# Patient Record
Sex: Male | Born: 1935
Health system: Southern US, Community
[De-identification: ages and names within clinical notes are randomized; demographics above are authoritative.]

## PROBLEM LIST (undated history)

## (undated) DIAGNOSIS — K5790 Diverticulosis of intestine, part unspecified, without perforation or abscess without bleeding: Secondary | ICD-10-CM

## (undated) DIAGNOSIS — L259 Unspecified contact dermatitis, unspecified cause: Secondary | ICD-10-CM

## (undated) DIAGNOSIS — R972 Elevated prostate specific antigen [PSA]: Secondary | ICD-10-CM

## (undated) DIAGNOSIS — N400 Enlarged prostate without lower urinary tract symptoms: Secondary | ICD-10-CM

## (undated) DIAGNOSIS — E872 Acidosis, unspecified: Secondary | ICD-10-CM

## (undated) DIAGNOSIS — I4891 Unspecified atrial fibrillation: Secondary | ICD-10-CM

## (undated) DIAGNOSIS — D649 Anemia, unspecified: Secondary | ICD-10-CM

## (undated) DIAGNOSIS — N200 Calculus of kidney: Secondary | ICD-10-CM

## (undated) DIAGNOSIS — I700 Atherosclerosis of aorta: Secondary | ICD-10-CM

## (undated) DIAGNOSIS — G629 Polyneuropathy, unspecified: Secondary | ICD-10-CM

## (undated) DIAGNOSIS — I482 Chronic atrial fibrillation, unspecified: Secondary | ICD-10-CM

## (undated) DIAGNOSIS — I10 Essential (primary) hypertension: Secondary | ICD-10-CM

## (undated) DIAGNOSIS — C61 Malignant neoplasm of prostate: Secondary | ICD-10-CM

## (undated) DIAGNOSIS — E785 Hyperlipidemia, unspecified: Secondary | ICD-10-CM

## (undated) DIAGNOSIS — E1140 Type 2 diabetes mellitus with diabetic neuropathy, unspecified: Secondary | ICD-10-CM

## (undated) DIAGNOSIS — I2510 Atherosclerotic heart disease of native coronary artery without angina pectoris: Secondary | ICD-10-CM

## (undated) DIAGNOSIS — E782 Mixed hyperlipidemia: Secondary | ICD-10-CM

## (undated) DIAGNOSIS — B351 Tinea unguium: Secondary | ICD-10-CM

## (undated) HISTORY — PX: EXPLORATORY LAPAROTOMY: SUR591

## (undated) HISTORY — PX: PROSTATE BIOPSY: SHX241

## (undated) HISTORY — DX: Benign prostatic hyperplasia without lower urinary tract symptoms: N40.0

## (undated) HISTORY — DX: Essential (primary) hypertension: I10

## (undated) HISTORY — DX: Chronic atrial fibrillation, unspecified: I48.20

## (undated) HISTORY — DX: Calculus of kidney: N20.0

## (undated) HISTORY — DX: Benign prostatic hyperplasia without lower urinary tract symptoms: R97.20

## (undated) HISTORY — DX: Polyneuropathy, unspecified: G62.9

## (undated) HISTORY — DX: Acidosis: E87.2

## (undated) HISTORY — DX: Acidosis, unspecified: E87.20

## (undated) HISTORY — DX: Unspecified atrial fibrillation: I48.91

## (undated) HISTORY — DX: Elevated prostate specific antigen (PSA): R97.20

## (undated) HISTORY — DX: Atherosclerotic heart disease of native coronary artery without angina pectoris: I25.10

## (undated) HISTORY — DX: Tinea unguium: B35.1

## (undated) HISTORY — DX: Unspecified contact dermatitis, unspecified cause: L25.9

## (undated) HISTORY — DX: Atherosclerosis of aorta: I70.0

## (undated) HISTORY — DX: Hyperlipidemia, unspecified: E78.5

## (undated) HISTORY — DX: Diverticulosis of intestine, part unspecified, without perforation or abscess without bleeding: K57.90

## (undated) HISTORY — DX: Anemia, unspecified: D64.9

## (undated) HISTORY — DX: Mixed hyperlipidemia: E78.2

## (undated) HISTORY — DX: Type 2 diabetes mellitus with diabetic neuropathy, unspecified: E11.40

---
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
## 1999-11-02 HISTORY — PX: LUMBAR LAMINECTOMY: SHX95

## 2001-11-01 HISTORY — PX: CATARACT EXTRACTION W/ INTRAOCULAR LENS  IMPLANT, BILATERAL: SHX1307

## 2008-11-01 HISTORY — PX: CARPAL TUNNEL WITH CUBITAL TUNNEL: SHX5608

## 2012-11-01 HISTORY — PX: OTHER SURGICAL HISTORY: SHX169

## 2015-03-05 LAB — LIPID PANEL
Cholesterol: 120 mg/dL (ref 0–200)
HDL: 30 mg/dL — AB (ref 35–70)
LDL CALC: 77 mg/dL
Triglycerides: 141 mg/dL (ref 40–160)

## 2015-03-05 LAB — BASIC METABOLIC PANEL: CREATININE: 1.1 mg/dL (ref 0.6–1.3)

## 2015-05-22 LAB — HEMOGLOBIN A1C: Hemoglobin A1C: 7.3

## 2016-08-03 LAB — HEPATIC FUNCTION PANEL
ALK PHOS: 54 U/L (ref 25–125)
ALT: 19 U/L (ref 10–40)
AST: 14 U/L (ref 14–40)
Bilirubin, Total: 1.1 mg/dL

## 2016-08-03 LAB — CBC AND DIFFERENTIAL
HEMATOCRIT: 45 % (ref 41–53)
HEMOGLOBIN: 15.3 g/dL (ref 13.5–17.5)
PLATELETS: 205 10*3/uL (ref 150–399)
WBC: 8.7 10^3/mL

## 2016-08-03 LAB — BASIC METABOLIC PANEL
BUN: 22 mg/dL — AB (ref 4–21)
CREATININE: 1.1 mg/dL (ref 0.6–1.3)
Potassium: 4.8 mmol/L (ref 3.4–5.3)
Sodium: 143 mmol/L (ref 137–147)

## 2016-08-03 LAB — LIPID PANEL
CHOLESTEROL: 128 mg/dL (ref 0–200)
HDL: 38 mg/dL (ref 35–70)
LDL CALC: 59 mg/dL
TRIGLYCERIDES: 155 mg/dL (ref 40–160)

## 2016-08-03 LAB — TSH: TSH: 3.34 u[IU]/mL (ref 0.41–5.90)

## 2016-08-03 LAB — HEMOGLOBIN A1C: HEMOGLOBIN A1C: 7.3

## 2017-02-03 ENCOUNTER — Encounter: Payer: Self-pay | Admitting: Internal Medicine

## 2017-02-15 ENCOUNTER — Encounter: Payer: Self-pay | Admitting: Internal Medicine

## 2017-02-15 ENCOUNTER — Non-Acute Institutional Stay: Payer: Medicare Other | Admitting: Internal Medicine

## 2017-02-15 DIAGNOSIS — I482 Chronic atrial fibrillation, unspecified: Secondary | ICD-10-CM | POA: Insufficient documentation

## 2017-02-15 DIAGNOSIS — E785 Hyperlipidemia, unspecified: Secondary | ICD-10-CM | POA: Diagnosis not present

## 2017-02-15 DIAGNOSIS — R972 Elevated prostate specific antigen [PSA]: Secondary | ICD-10-CM

## 2017-02-15 DIAGNOSIS — E782 Mixed hyperlipidemia: Secondary | ICD-10-CM | POA: Insufficient documentation

## 2017-02-15 DIAGNOSIS — I10 Essential (primary) hypertension: Secondary | ICD-10-CM | POA: Insufficient documentation

## 2017-02-15 DIAGNOSIS — N400 Enlarged prostate without lower urinary tract symptoms: Secondary | ICD-10-CM | POA: Insufficient documentation

## 2017-02-15 DIAGNOSIS — I2510 Atherosclerotic heart disease of native coronary artery without angina pectoris: Secondary | ICD-10-CM | POA: Diagnosis not present

## 2017-02-15 DIAGNOSIS — N401 Enlarged prostate with lower urinary tract symptoms: Secondary | ICD-10-CM

## 2017-02-15 DIAGNOSIS — E1140 Type 2 diabetes mellitus with diabetic neuropathy, unspecified: Secondary | ICD-10-CM

## 2017-02-15 DIAGNOSIS — R350 Frequency of micturition: Secondary | ICD-10-CM

## 2017-02-15 DIAGNOSIS — E119 Type 2 diabetes mellitus without complications: Secondary | ICD-10-CM | POA: Insufficient documentation

## 2017-02-15 HISTORY — DX: Essential (primary) hypertension: I10

## 2017-02-15 MED ORDER — LOSARTAN POTASSIUM 50 MG PO TABS: ORAL_TABLET | ORAL | 3 refills | Status: DC

## 2017-02-15 NOTE — Progress Notes (Signed)
HISTORY AND PHYSICAL  Location:    [place]   Place of Service: Clinic (12)   Extended Emergency Contact Information Primary Emergency Contact: Trautman,Elizabeth Address: [address]          [place], [place] [address] [place] Mobile Phone: [phone] Relation: Daughter  Advanced Directive information Does Patient Have a Medical Advance Directive?: Yes, Type of Advance Directive: Healthcare Power of [place];Living will (working on [place] -has out of state )  Chief Complaint  Patient presents with  . Medical Management of Chronic Issues    New Patient, moved to [facility] 01/07/17.  Here with wife    HPI:  Moved to [facility] in March 2018. Adapting well. Daughter lives nearby.  Chronic atrial fibrillation (HCC) - controlled rate and anticoagulated  Coronary artery disease involving native coronary artery of native heart without angina pectoris - says he has had a prior Mi and catheterization. Currently asymptomatic.-   Benign prostatic hyperplasia with urinary frequency - denies incontinence. Hx of elevated PSA and biopsy.  Type 2 diabetes mellitus with diabetic neuropathy, without long-term current use of insulin (HCC) - A1c 7.3 in Oct 2017. Glucose 147 thn. No current treatment.  Elevated PSA - needs follow up in the future  Hyperlipidemia, unspecified hyperlipidemia type - controlled on atorvastatin    Past Medical History:  Diagnosis Date  . Atrial fibrillation ([place])   . BPH (benign prostatic hyperplasia)   . CAD (coronary artery disease)   . DM neuropathy, type II diabetes mellitus ([place])   . Elevated PSA   . HLD (hyperlipidemia)   . Neuropathy   . Urgency of urination     Past Surgical History:  Procedure Laterality Date  . BACK SURGERY  2001   L5  . CARPAL TUNNEL WITH CUBITAL TUNNEL Right 2010  . CATARACT EXTRACTION W/ INTRAOCULAR LENS  IMPLANT, BILATERAL Bilateral 2003  . TONSILLECTOMY AND ADENOIDECTOMY  1956  . tooth implant  2014     No care team member to display  Social History   Social History  . Marital status: Married    Spouse name: N/A  . Number of children: N/A  . Years of education: N/A   Occupational History  . retired Museum/gallery conservator    Social History Main Topics  . Smoking status: Former Research scientist (life sciences)  . Smokeless tobacco: Never Used  . Alcohol use No  . Drug use: No  . Sexual activity: Not on file   Other Topics Concern  . Not on file   Social History Narrative   Moved to [facility] 01/07/2017   No Tobacco use per day now. Used to smoke 1 pack per week for 10 years about 40-45 years ago.    No alcohol use.    Sometimes drinks/eats things with caffeine.    Married in [place] and lives in a 3 story apartment building     Current or past profession; Main frame Museum/gallery conservator.    Exercise, no/yes- plays golf once a week.    Has a living will, DNR, and POA/HPOA.    reports that he has quit smoking. He has never used smokeless tobacco. He reports that he does not drink alcohol or use drugs.  Family History  Problem Relation Age of Onset  . Heart failure Mother   . Heart attack Father    Family Status  Relation Status  . Mother Deceased  . Father Deceased  . Sister Alive  . Daughter Alive  .  Son Alive  . Son Alive  . Daughter Alive    Immunization History  Administered Date(s) Administered  . Influenza-Unspecified 09/01/2016  . Pneumococcal-Unspecified 11/01/1996  . Td 11/01/2006    No Known Allergies  Medications: Patient's Medications  New Prescriptions   No medications on file  Previous Medications   ATORVASTATIN (LIPITOR) 10 MG TABLET    Take 10 mg by mouth daily.   DIGOXIN (LANOXIN) 0.125 MG TABLET    Take by mouth. Take one tablet daily for heart rhythm   FINASTERIDE (PROSCAR) 5 MG TABLET    Take 5 mg by mouth daily.   OXYBUTYNIN (DITROPAN-XL) 10 MG 24 HR TABLET    Take 10 mg by mouth daily.   RIVAROXABAN (XARELTO) 20 MG TABS TABLET    Take  20 mg by mouth daily.   TAMSULOSIN (FLOMAX) 0.4 MG CAPS CAPSULE    Take 0.4 mg by mouth daily.   TELMISARTAN (MICARDIS) 40 MG TABLET    Take 40 mg by mouth daily.  Modified Medications   No medications on file  Discontinued Medications   DIGOXIN (LANOXIN) 0.25 MG TABLET    Take 0.25 mg by mouth daily.    Review of Systems  Constitutional: Negative for activity change, appetite change, fatigue, fever and unexpected weight change.  HENT: Positive for sinus pressure. Negative for congestion, ear pain, hearing loss, rhinorrhea, sore throat, tinnitus, trouble swallowing and voice change.   Eyes:       Bilateral cataract extractions  Respiratory: Negative for cough, choking, chest tightness, shortness of breath and wheezing.   Cardiovascular: Negative for chest pain, palpitations and leg swelling.       CAD and hx prior MI. Chronic AF  Gastrointestinal: Negative for abdominal distention, abdominal pain, constipation, diarrhea and nausea.       Fatty food intolerance with pain in the right side.  Endocrine: Negative for cold intolerance, heat intolerance, polydipsia, polyphagia and polyuria.       DM with neuropathy  Genitourinary: Positive for frequency and urgency. Negative for dysuria and testicular pain.       BPH. Weak stream.  Musculoskeletal: Negative for arthralgias, back pain, gait problem, myalgias and neck pain.  Skin: Negative for color change, pallor and rash.  Allergic/Immunologic: Negative.   Neurological: Positive for numbness (distal right les following surgery on his back, but the left leg is getting mre symptomatic). Negative for dizziness, tremors, syncope, speech difficulty, weakness and headaches.  Hematological: Negative for adenopathy. Does not bruise/bleed easily.  Psychiatric/Behavioral: Negative for behavioral problems, confusion, decreased concentration, hallucinations and sleep disturbance. The patient is not nervous/anxious.     Vitals:   02/15/17 1054  BP:  (!) 120/58  Pulse: 69  Temp: 97.7 F (36.5 C)  TempSrc: Oral  SpO2: 99%  Weight: 176 lb (79.8 kg)  Height: 5\' 10"  (1.778 m)   Body mass index is 25.25 kg/m. Filed Weights   02/15/17 1054  Weight: 176 lb (79.8 kg)     Physical Exam  Constitutional: He is oriented to person, place, and time. He appears well-developed and well-nourished. No distress.  HENT:  Right Ear: External ear normal.  Left Ear: External ear normal.  Nose: Nose normal.  Mouth/Throat: Oropharynx is clear and moist. No oropharyngeal exudate.  Eyes: Conjunctivae and EOM are normal. Pupils are equal, round, and reactive to light.  Neck: No JVD present. No tracheal deviation present. No thyromegaly present.  Cardiovascular: Normal rate, normal heart sounds and intact distal pulses.  Exam reveals  no gallop and no friction rub.   No murmur heard. AF  Pulmonary/Chest: No respiratory distress. He has no wheezes. He has no rales. He exhibits no tenderness.  Abdominal: He exhibits no distension and no mass. There is no tenderness.  Musculoskeletal: Normal range of motion. He exhibits no edema or tenderness.  Lymphadenopathy:    He has no cervical adenopathy.  Neurological: He is alert and oriented to person, place, and time. He has normal reflexes. No cranial nerve deficit. Coordination normal.  01/20/17 SLUMS 29/30  Skin: No rash noted. No erythema. No pallor.  Psychiatric: He has a normal mood and affect. His behavior is normal. Judgment and thought content normal.    Labs reviewed: Labs forwarded from Oct 2017 were reviewed.   Assessment/Plan  1. Chronic atrial fibrillation (HCC) Rate controlled and anticoagulated.  2. Coronary artery disease involving native coronary artery of native heart without angina pectoris asymptomatic  3. Benign prostatic hyperplasia with urinary frequency The current medical regimen is effective;  continue present plan and medications.  4. Type 2 diabetes mellitus with  diabetic neuropathy, without long-term current use of insulin (HCC) -A1c, CMP  5. Elevated PSA -PSA  6. Hyperlipidemia, unspecified hyperlipidemia type -lipids  7. HTN Insurance will not cover Micardis according to patient. He still  Has some left. He will be switched to losartan when he uses up his Micardis

## 2017-02-16 NOTE — Addendum Note (Signed)
Addended by: Estill Dooms on: 02/16/2017 04:56 PM   Modules accepted: Level of Service

## 2017-02-17 ENCOUNTER — Encounter: Payer: Self-pay | Admitting: Internal Medicine

## 2017-02-17 ENCOUNTER — Encounter: Payer: Self-pay | Admitting: Nurse Practitioner

## 2017-02-21 ENCOUNTER — Encounter: Payer: Self-pay | Admitting: Cardiology

## 2017-02-21 DIAGNOSIS — I2510 Atherosclerotic heart disease of native coronary artery without angina pectoris: Secondary | ICD-10-CM | POA: Diagnosis not present

## 2017-02-21 DIAGNOSIS — E785 Hyperlipidemia, unspecified: Secondary | ICD-10-CM | POA: Diagnosis not present

## 2017-02-21 DIAGNOSIS — Z7901 Long term (current) use of anticoagulants: Secondary | ICD-10-CM

## 2017-02-21 DIAGNOSIS — I480 Paroxysmal atrial fibrillation: Secondary | ICD-10-CM | POA: Diagnosis not present

## 2017-02-21 HISTORY — DX: Long term (current) use of anticoagulants: Z79.01

## 2017-04-04 ENCOUNTER — Encounter: Payer: Self-pay | Admitting: Internal Medicine

## 2017-04-14 ENCOUNTER — Encounter: Payer: Self-pay | Admitting: Internal Medicine

## 2017-04-18 DIAGNOSIS — R972 Elevated prostate specific antigen [PSA]: Secondary | ICD-10-CM | POA: Diagnosis not present

## 2017-04-18 DIAGNOSIS — N401 Enlarged prostate with lower urinary tract symptoms: Secondary | ICD-10-CM | POA: Diagnosis not present

## 2017-04-19 DIAGNOSIS — H524 Presbyopia: Secondary | ICD-10-CM | POA: Diagnosis not present

## 2017-04-19 DIAGNOSIS — E118 Type 2 diabetes mellitus with unspecified complications: Secondary | ICD-10-CM | POA: Diagnosis not present

## 2017-04-27 ENCOUNTER — Encounter: Payer: Self-pay | Admitting: Internal Medicine

## 2017-04-28 ENCOUNTER — Encounter: Payer: Self-pay | Admitting: Nurse Practitioner

## 2017-05-08 ENCOUNTER — Emergency Department (HOSPITAL_COMMUNITY)
Admission: EM | Admit: 2017-05-08 | Discharge: 2017-05-08 | Disposition: A | Discharge status: Home / Self Care | Payer: Medicare Other | Attending: Emergency Medicine | Admitting: Emergency Medicine

## 2017-05-08 ENCOUNTER — Encounter (HOSPITAL_COMMUNITY): Payer: Self-pay

## 2017-05-08 ENCOUNTER — Emergency Department (HOSPITAL_COMMUNITY): Payer: Medicare Other

## 2017-05-08 DIAGNOSIS — Z79899 Other long term (current) drug therapy: Secondary | ICD-10-CM | POA: Insufficient documentation

## 2017-05-08 DIAGNOSIS — Z87891 Personal history of nicotine dependence: Secondary | ICD-10-CM | POA: Diagnosis not present

## 2017-05-08 DIAGNOSIS — S6991XA Unspecified injury of right wrist, hand and finger(s), initial encounter: Secondary | ICD-10-CM | POA: Diagnosis not present

## 2017-05-08 DIAGNOSIS — M25531 Pain in right wrist: Secondary | ICD-10-CM | POA: Insufficient documentation

## 2017-05-08 DIAGNOSIS — E1140 Type 2 diabetes mellitus with diabetic neuropathy, unspecified: Secondary | ICD-10-CM | POA: Insufficient documentation

## 2017-05-08 DIAGNOSIS — I2510 Atherosclerotic heart disease of native coronary artery without angina pectoris: Secondary | ICD-10-CM | POA: Diagnosis not present

## 2017-05-08 DIAGNOSIS — Z7901 Long term (current) use of anticoagulants: Secondary | ICD-10-CM | POA: Insufficient documentation

## 2017-05-08 DIAGNOSIS — M79641 Pain in right hand: Secondary | ICD-10-CM | POA: Diagnosis not present

## 2017-05-08 DIAGNOSIS — I499 Cardiac arrhythmia, unspecified: Secondary | ICD-10-CM | POA: Diagnosis not present

## 2017-05-08 DIAGNOSIS — M7989 Other specified soft tissue disorders: Secondary | ICD-10-CM | POA: Diagnosis not present

## 2017-05-08 MED ORDER — MORPHINE SULFATE (PF) 2 MG/ML IV SOLN: 4.0000 mg | Freq: Once | INTRAVENOUS | Status: DC

## 2017-05-08 MED ORDER — DEXAMETHASONE 4 MG PO TABS
8.0000 mg | ORAL_TABLET | Freq: Once | ORAL | Status: AC
  Administered 2017-05-08: 8 mg via ORAL
  Filled 2017-05-08: qty 2

## 2017-05-08 MED ORDER — OXYCODONE-ACETAMINOPHEN 5-325 MG PO TABS
2.0000 | ORAL_TABLET | Freq: Once | ORAL | Status: AC
  Administered 2017-05-08: 2 via ORAL
  Filled 2017-05-08: qty 2

## 2017-05-08 MED ORDER — DIAZEPAM 5 MG PO TABS
5.0000 mg | ORAL_TABLET | Freq: Once | ORAL | Status: AC
  Administered 2017-05-08: 5 mg via ORAL
  Filled 2017-05-08: qty 1

## 2017-05-08 MED ORDER — DEXAMETHASONE 4 MG PO TABS: 8.0000 mg | ORAL_TABLET | Freq: Every day | ORAL | 0 refills | Status: DC

## 2017-05-08 MED ORDER — OXYCODONE-ACETAMINOPHEN 5-325 MG PO TABS: 1.0000 | ORAL_TABLET | ORAL | 0 refills | Status: DC | PRN

## 2017-05-08 NOTE — ED Triage Notes (Signed)
Pt family member fell Saturday.  Pt family grabbed right wrist to stand.  Pt started having pain last night.  Able to move fingers.

## 2017-05-08 NOTE — ED Provider Notes (Signed)
[facility] DEPT Provider Note   CSN: [id number] Arrival date & time: 05/08/17  0935  By signing my name below, I, Sonum Patel, attest that this documentation has been prepared under the direction and in the presence of Virgel Manifold, MD. Electronically Signed: Sonum Patel, Education administrator. 05/08/17. 11:02 AM.  History   Chief Complaint Chief Complaint  Patient presents with  . Hand Pain    The history is provided by the patient. No language interpreter was used.     HPI Comments: Ryan Cochran. is a 83 y.o. male who presents to the Emergency Department complaining of persistent right wrist and hand pain after an injury that occurred 2 days ago. Patient states he was helping his wife up 2 days ago when she grabbed and pulled on his right wrist. He reports experiencing severe pain at that time but states the pain improved as he was able to use it and function the following day and do activities such cut up her food. He states the pain returned while he was sleeping at 2 or 3 AM this morning. He notes associated soreness to the right elbow but he believes this is due to holding his arm stiff. He has tried Tylenol without relief. He states movement of the affected area worsens his pain.   Past Medical History:  Diagnosis Date  . BPH (benign prostatic hyperplasia)   . CAD (coronary artery disease)   . Chronic atrial fibrillation (HCC)    CHA2DS2VASC score 5    . Contact dermatitis   . DM neuropathy, type II diabetes mellitus ([place])   . Elevated PSA   . HLD (hyperlipidemia)   . HTN (hypertension) 02/15/2017  . Neuropathy   . Onychomycosis of toenail     Patient Active Problem List   Diagnosis Date Noted  . Long term current use of anticoagulant therapy 02/21/2017  . HTN (hypertension) 02/15/2017  . Chronic atrial fibrillation ([place])   . CAD (coronary artery disease)   . BPH (benign prostatic hyperplasia)   . DM neuropathy, type II diabetes mellitus ([place])   . Elevated PSA   . HLD  (hyperlipidemia)     Past Surgical History:  Procedure Laterality Date  . CARPAL TUNNEL WITH CUBITAL TUNNEL Right 2010  . CATARACT EXTRACTION W/ INTRAOCULAR LENS  IMPLANT, BILATERAL Bilateral 2003  . LUMBAR LAMINECTOMY  2001   L5  . TONSILLECTOMY AND ADENOIDECTOMY  1956  . tooth implant  2014       Home Medications    Prior to Admission medications   Medication Sig Start Date End Date Taking? Authorizing Provider  atorvastatin (LIPITOR) 10 MG tablet Take 10 mg by mouth daily.    [provider]  digoxin (LANOXIN) 0.125 MG tablet Take by mouth. Take one tablet daily for heart rhythm    [provider]  finasteride (PROSCAR) 5 MG tablet Take 5 mg by mouth daily.    [provider]  losartan (COZAAR) 50 MG tablet One daily to control BP 02/15/17   Estill Dooms, MD  oxybutynin (DITROPAN-XL) 10 MG 24 hr tablet Take 10 mg by mouth daily.    [provider]  rivaroxaban (XARELTO) 20 MG TABS tablet Take 20 mg by mouth daily.    [provider]  tamsulosin (FLOMAX) 0.4 MG CAPS capsule Take 0.4 mg by mouth daily.    [provider]    Family History Family History  Problem Relation Age of Onset  . Heart failure Mother   .  Heart attack Father     Social History Social History  Substance Use Topics  . Smoking status: Former Research scientist (life sciences)  . Smokeless tobacco: Never Used  . Alcohol use No     Allergies   Patient has no known allergies.   Review of Systems Review of Systems  All other systems reviewed and are negative for acute change except as noted in the HPI.   Physical Exam Updated Vital Signs BP 122/73 (BP Location: Left Arm)   Pulse 75   Temp 98 F (36.7 C) (Oral)   Resp 17   SpO2 99%   Physical Exam  Constitutional: He is oriented to person, place, and time. He appears well-developed and well-nourished.  Appears uncomfortable   HENT:  Head: Normocephalic.  Eyes: EOM are normal.  Neck: Normal range of  motion.  Cardiovascular: Normal rate and normal heart sounds.  An irregularly irregular rhythm present.  Pulmonary/Chest: Effort normal and breath sounds normal. No respiratory distress. He has no wheezes. He has no rales.  Musculoskeletal: He exhibits edema and tenderness.  Swelling at the right distal wrist and proximal hand, particularly across carpal rows. Mild erythema. Exquisite pain with ROM and ROM is limited by his pain. NVI distally.   Neurological: He is alert and oriented to person, place, and time. No sensory deficit. He exhibits normal muscle tone. Coordination normal.  Psychiatric: He has a normal mood and affect.  Nursing note and vitals reviewed.    ED Treatments / Results  DIAGNOSTIC STUDIES: Oxygen Saturation is 99% on RA, normal by my interpretation.    COORDINATION OF CARE: 10:52 AM Discussed treatment plan with pt at bedside and pt agreed to plan.  Labs (all labs ordered are listed, but only abnormal results are displayed) Labs Reviewed - No data to display  EKG  EKG Interpretation None       Radiology Dg Wrist Complete Right  Result Date: 05/08/2017 CLINICAL DATA:  Wrist pain and swelling. EXAM: RIGHT WRIST - COMPLETE 3+ VIEW COMPARISON:  No comparison studies available. FINDINGS: No evidence of fracture. No subluxation or dislocation. Degenerative changes are seen in the radial carpus and first carpometacarpal joint. IMPRESSION: 1. No acute bony abnormality. 2. Degenerative changes in the lateral wrist and first carpometacarpal joint. Electronically Signed   By: Misty Stanley M.D.   On: 05/08/2017 10:11   Dg Hand Complete Right  Result Date: 05/08/2017 CLINICAL DATA:  Injury with pain. EXAM: RIGHT HAND - COMPLETE 3+ VIEW COMPARISON:  No comparison studies available. FINDINGS: No fracture. No subluxation or dislocation. Degenerative changes are noted in scattered IP joints, the MCP joint of the thumb and the first carpometacarpal joint. IMPRESSION:  Degenerative changes without acute bony abnormality. Electronically Signed   By: Misty Stanley M.D.   On: 05/08/2017 10:12    Procedures Procedures (including critical care time)  Medications Ordered in ED Medications - No data to display   Initial Impression / Assessment and Plan / ED Course  I have reviewed the triage vital signs and the nursing notes.  Pertinent labs & imaging results that were available during my care of the patient were reviewed by me and considered in my medical decision making (see chart for details).     83 year old male with severe right hand/wrist pain. The interval period after initial injury before worsening pain suggests strain/soft tissue injury. X-rays negative for acute osseous abnormality. Plan symptomatic treatment at this time. He is anticoagulated for his atrial fibrillation. Avoid NSAIDs. Plan pain medicine  and steroids for anti-inflammatory purposes. Wrist splint for comfort purposes. Orthopedic/hand follow-up if he has significant persistent symptoms.  Final Clinical Impressions(s) / ED Diagnoses   Final diagnoses:  Acute pain of right wrist    New Prescriptions New Prescriptions   No medications on file    I personally preformed the services scribed in my presence. The recorded information has been reviewed is accurate. Virgel Manifold, MD.    Virgel Manifold, MD 05/08/17 1116

## 2017-05-08 NOTE — Discharge Instructions (Addendum)
Your x-rays today do not show a fracture.  I suspect this is a severe strain/soft tissue injury particularly with the interval period between the initial injury and then the worsening pain. Wear the wrist splint for comfort. Your activity is limited by your symptoms (ie, if it hurts too much then don't do it). Take decadron for 3 more days and pain medication as needed. Avoid NSAIDs (ibuprofen, aleve, naprosyn, etc) since you are on a blood thinner for your afib. Symptoms may actually continue for a couple weeks. I expect you to see some improvement in a couple days though and it progressively get better. If you feel like you are getting worse or do not see improvement after a couple days then I would see orthopedics/hand surgery.

## 2017-06-02 ENCOUNTER — Other Ambulatory Visit: Payer: Self-pay

## 2017-06-02 DIAGNOSIS — I10 Essential (primary) hypertension: Secondary | ICD-10-CM

## 2017-06-02 DIAGNOSIS — E785 Hyperlipidemia, unspecified: Secondary | ICD-10-CM

## 2017-06-02 DIAGNOSIS — R972 Elevated prostate specific antigen [PSA]: Secondary | ICD-10-CM

## 2017-06-02 DIAGNOSIS — E1144 Type 2 diabetes mellitus with diabetic amyotrophy: Secondary | ICD-10-CM

## 2017-06-08 DIAGNOSIS — L218 Other seborrheic dermatitis: Secondary | ICD-10-CM | POA: Diagnosis not present

## 2017-06-08 DIAGNOSIS — L821 Other seborrheic keratosis: Secondary | ICD-10-CM | POA: Diagnosis not present

## 2017-06-08 DIAGNOSIS — L814 Other melanin hyperpigmentation: Secondary | ICD-10-CM | POA: Diagnosis not present

## 2017-06-20 DIAGNOSIS — E785 Hyperlipidemia, unspecified: Secondary | ICD-10-CM | POA: Diagnosis not present

## 2017-06-20 DIAGNOSIS — E1144 Type 2 diabetes mellitus with diabetic amyotrophy: Secondary | ICD-10-CM | POA: Diagnosis not present

## 2017-06-20 DIAGNOSIS — R972 Elevated prostate specific antigen [PSA]: Secondary | ICD-10-CM | POA: Diagnosis not present

## 2017-06-20 DIAGNOSIS — I10 Essential (primary) hypertension: Secondary | ICD-10-CM | POA: Diagnosis not present

## 2017-06-20 LAB — COMPREHENSIVE METABOLIC PANEL
ALK PHOS: 53 U/L (ref 40–115)
ALT: 16 U/L (ref 9–46)
AST: 13 U/L (ref 10–35)
Albumin: 3.8 g/dL (ref 3.6–5.1)
BILIRUBIN TOTAL: 0.8 mg/dL (ref 0.2–1.2)
BUN: 18 mg/dL (ref 7–25)
CO2: 25 mmol/L (ref 20–32)
Calcium: 8.8 mg/dL (ref 8.6–10.3)
Chloride: 105 mmol/L (ref 98–110)
Creat: 1 mg/dL (ref 0.70–1.11)
Glucose, Bld: 123 mg/dL — ABNORMAL HIGH (ref 65–99)
POTASSIUM: 4.3 mmol/L (ref 3.5–5.3)
SODIUM: 139 mmol/L (ref 135–146)
TOTAL PROTEIN: 6 g/dL — AB (ref 6.1–8.1)

## 2017-06-20 LAB — LIPID PANEL
Cholesterol: 122 mg/dL (ref <200)
HDL: 34 mg/dL — ABNORMAL LOW (ref >40)
LDL CALC: 63 mg/dL (ref <100)
TRIGLYCERIDES: 125 mg/dL (ref <150)
Total CHOL/HDL Ratio: 3.6 Ratio (ref <5.0)
VLDL: 25 mg/dL (ref <30)

## 2017-06-20 LAB — PSA: PSA: 5.2 ng/mL — AB (ref <=4.0)

## 2017-06-20 LAB — HEMOGLOBIN A1C
HEMOGLOBIN A1C: 6.9 % — AB (ref <5.7)
Mean Plasma Glucose: 151 mg/dL

## 2017-06-28 ENCOUNTER — Encounter: Payer: Self-pay | Admitting: Internal Medicine

## 2017-06-29 ENCOUNTER — Encounter: Payer: Self-pay | Admitting: Internal Medicine

## 2017-06-29 ENCOUNTER — Other Ambulatory Visit: Payer: Self-pay | Admitting: Internal Medicine

## 2017-06-29 ENCOUNTER — Non-Acute Institutional Stay: Payer: Medicare Other | Admitting: Internal Medicine

## 2017-06-29 VITALS — BP 118/64 | HR 62 | Temp 97.5°F | Resp 18 | Ht 70.0 in | Wt 177.8 lb

## 2017-06-29 DIAGNOSIS — Z7901 Long term (current) use of anticoagulants: Secondary | ICD-10-CM

## 2017-06-29 DIAGNOSIS — R972 Elevated prostate specific antigen [PSA]: Secondary | ICD-10-CM | POA: Diagnosis not present

## 2017-06-29 DIAGNOSIS — E1149 Type 2 diabetes mellitus with other diabetic neurological complication: Secondary | ICD-10-CM

## 2017-06-29 DIAGNOSIS — I10 Essential (primary) hypertension: Secondary | ICD-10-CM | POA: Diagnosis not present

## 2017-06-29 DIAGNOSIS — M25531 Pain in right wrist: Secondary | ICD-10-CM

## 2017-06-29 DIAGNOSIS — I482 Chronic atrial fibrillation, unspecified: Secondary | ICD-10-CM

## 2017-06-29 DIAGNOSIS — E785 Hyperlipidemia, unspecified: Secondary | ICD-10-CM

## 2017-06-29 DIAGNOSIS — I2510 Atherosclerotic heart disease of native coronary artery without angina pectoris: Secondary | ICD-10-CM

## 2017-06-29 DIAGNOSIS — N400 Enlarged prostate without lower urinary tract symptoms: Secondary | ICD-10-CM

## 2017-06-29 HISTORY — DX: Type 2 diabetes mellitus with other diabetic neurological complication: E11.49

## 2017-06-29 MED ORDER — NAPROXEN 500 MG PO TABS: 500.0000 mg | ORAL_TABLET | Freq: Two times a day (BID) | ORAL | 0 refills | Status: DC

## 2017-06-29 NOTE — Progress Notes (Signed)
[facility]  Provider: Blanchie Serve MD   Location:  [place] of Service:  Clinic (12)  PCP: Blanchie Serve, MD Patient Care Team: Blanchie Serve, MD as PCP - General (Internal Medicine)  Extended Emergency Contact Information Primary Emergency Contact: [facility] Address: [address]          [place], [place] [address] Johnnette Litter of [place] Mobile Phone: [phone] Relation: Daughter   Goals of Care: Advanced Directive information Advanced Directives 05/08/2017  Does Patient Have a Medical Advance Directive? Yes  Type of Paramedic of [place];Living will  Copy of [place] in Chart? -     Chief Complaint  Patient presents with  . Medical Management of Chronic Issues    4 month follow up. Patient has no concerns at this time.   . Medication Refill    No refills needed at this time  . Results    Discuss labs    HPI: Patient is a 83 y.o. male seen today for routine visit. He complaints of right wrist and hand pain.   Right wrist pain- 6-7 weeks back he injured his right thumb while trying to help his wife off the floor. He sustained strain/ soft tissue injury. Xray has ruled out fracture. He was placed on steroid and pain medication and had a wrist splint placed. Pain had subsided but he has discomfort with activities involving his hand. Denies any numbness or tingling.   afib- controlled HR. Currently on digoxin and rivaroxaban. Denies any palpitation. Follows with Dr Oran Rein  BPH- denies dysuria. Wakes up once at night to urinate. He has urgency but denies frequency during daytime. Denies incontinence. Currently on finasteride, tamsulosinand detrol. Follows with [facility] Dr Karsten Ro  HTN- currently on olmesartan, tolerating it well  HLD- currently on atorvastatin. Denies myalgia.   CAD- s/p stent in 2004, denies chest pain. Currently on ARB and statin  DM type 2- with  neuropathy to feet. Denies fall or problem with balance. Not on any med. Denies pain to feet. Denies sores or wound to feet.     Past Medical History:  Diagnosis Date  . BPH (benign prostatic hyperplasia)   . CAD (coronary artery disease)   . Chronic atrial fibrillation (HCC)    CHA2DS2VASC score 5    . Contact dermatitis   . DM neuropathy, type II diabetes mellitus ([place])   . Elevated PSA   . HLD (hyperlipidemia)   . HTN (hypertension) 02/15/2017  . Neuropathy   . Onychomycosis of toenail    Past Surgical History:  Procedure Laterality Date  . CARPAL TUNNEL WITH CUBITAL TUNNEL Right 2010  . CATARACT EXTRACTION W/ INTRAOCULAR LENS  IMPLANT, BILATERAL Bilateral 2003  . LUMBAR LAMINECTOMY  2001   L5  . TONSILLECTOMY AND ADENOIDECTOMY  1956  . tooth implant  2014    reports that he has quit smoking. He has never used smokeless tobacco. He reports that he does not drink alcohol or use drugs. Social History   Social History  . Marital status: Married    Spouse name: N/A  . Number of children: N/A  . Years of education: N/A   Occupational History  . retired Museum/gallery conservator    Social History Main Topics  . Smoking status: Former Research scientist (life sciences)  . Smokeless tobacco: Never Used  . Alcohol use No  . Drug use: No  . Sexual activity: Not on file   Other Topics  Concern  . Not on file   Social History Narrative   Moved to [facility] 01/07/2017   No Tobacco use per day now. Used to smoke 1 pack per week for 10 years about 40-45 years ago.    No alcohol use.    Sometimes drinks/eats things with caffeine.    Married in [place] and lives in a 3 story apartment building     Current or past profession; Main frame Museum/gallery conservator.    Exercise, no/yes- plays golf once a week.    Has a living will, DNR, and POA/HPOA.    Functional Status Survey:    Family History  Problem Relation Age of Onset  . Heart failure Mother   . Heart attack Father     Health  Maintenance  Topic Date Due  . INFLUENZA VACCINE  08/01/2017 (Originally 06/01/2017)  . PNA vac Low Risk Adult (2 of 2 - PCV13) 08/01/2017 (Originally 08/16/2012)  . FOOT EXAM  11/01/2017 (Originally 01/01/1943)  . OPHTHALMOLOGY EXAM  11/01/2017 (Originally 01/01/1943)  . TETANUS/TDAP  11/01/2017 (Originally 11/01/2016)  . HEMOGLOBIN A1C  12/21/2017    No Known Allergies  Outpatient Encounter Prescriptions as of 06/29/2017  Medication Sig  . atorvastatin (LIPITOR) 10 MG tablet Take 10 mg by mouth daily.  . digoxin (LANOXIN) 0.125 MG tablet Take 0.125 mg by mouth daily.   . finasteride (PROSCAR) 5 MG tablet Take 5 mg by mouth daily.  [name] olmesartan (BENICAR) 40 MG tablet Take 40 mg by mouth daily.  . rivaroxaban (XARELTO) 20 MG TABS tablet Take 20 mg by mouth daily.  . tamsulosin (FLOMAX) 0.4 MG CAPS capsule Take 0.4 mg by mouth daily.  [name] tolterodine (DETROL LA) 4 MG 24 hr capsule Take 4 mg by mouth daily.  . [DISCONTINUED] dexamethasone (DECADRON) 4 MG tablet Take 2 tablets (8 mg total) by mouth daily.  . [DISCONTINUED] losartan (COZAAR) 50 MG tablet One daily to control BP  . [DISCONTINUED] oxybutynin (DITROPAN-XL) 10 MG 24 hr tablet Take 10 mg by mouth daily.  . [DISCONTINUED] oxyCODONE-acetaminophen (PERCOCET/ROXICET) 5-325 MG tablet Take 1-2 tablets by mouth every 4 (four) hours as needed for severe pain.   No facility-administered encounter medications on file as of 06/29/2017.     Review of Systems  Constitutional: Negative for appetite change, chills and fever.  HENT: Negative for congestion, mouth sores, rhinorrhea, sinus pain and trouble swallowing.        Has bleeding gums, chronic problem  Eyes: Negative for visual disturbance.  Respiratory: Negative for cough, shortness of breath and wheezing.   Cardiovascular: Negative for chest pain, palpitations and leg swelling.  Gastrointestinal: Negative for abdominal pain, blood in stool, constipation, diarrhea, nausea and vomiting.    Genitourinary: Positive for frequency. Negative for dysuria and hematuria.  Musculoskeletal: Negative for back pain and gait problem.       History of L5 herniated disc repair in 2001  Skin: Negative for rash and wound.  Neurological: Positive for numbness. Negative for tremors, seizures, syncope, light-headedness and headaches.       Neuropathy to both feet  Hematological: Bruises/bleeds easily.  Psychiatric/Behavioral: Negative for behavioral problems, hallucinations, sleep disturbance and suicidal ideas.    Vitals:   06/29/17 0853  BP: 118/64  Pulse: 62  Resp: 18  Temp: (!) 97.5 F (36.4 C)  TempSrc: Oral  SpO2: 99%  Weight: 177 lb 12.8 oz (80.6 kg)  Height: 5\' 10"  (1.778 m)   Body mass index is 25.51 kg/m.  Physical Exam  Constitutional: He is oriented to person, place, and time. He appears well-developed and well-nourished. No distress.  HENT:  Head: Normocephalic and atraumatic.  Right Ear: External ear normal.  Left Ear: External ear normal.  Mouth/Throat: Oropharynx is clear and moist. No oropharyngeal exudate.  Eyes: Pupils are equal, round, and reactive to light. Conjunctivae are normal. Right eye exhibits no discharge. Left eye exhibits no discharge. No scleral icterus.  Neck: Normal range of motion. Neck supple. No JVD present. No thyromegaly present.  Cardiovascular:  Murmur heard. Irregular heart rate  Pulmonary/Chest: Effort normal and breath sounds normal. He has no wheezes. He has no rales.  Abdominal: Soft. Bowel sounds are normal. He exhibits no distension. There is no guarding.  Musculoskeletal: He exhibits no edema.  Right wrist flexion and extension illicits pain. Movement of right thumb with extension and flexion has normal ROM.   Lymphadenopathy:    He has no cervical adenopathy.  Neurological: He is alert and oriented to person, place, and time.  Skin: Skin is warm and dry. No rash noted. He is not diaphoretic.  Psychiatric: He has a normal mood  and affect. His behavior is normal.    Labs reviewed: Basic Metabolic Panel:  Recent Labs  08/03/16 06/20/17 0740  NA 143 139  K 4.8 4.3  CL  --  105  CO2  --  25  GLUCOSE  --  123*  BUN 22* 18  CREATININE 1.1 1.00  CALCIUM  --  8.8   Liver Function Tests:  Recent Labs  08/03/16 06/20/17 0740  AST 14 13  ALT 19 16  ALKPHOS 54 53  BILITOT  --  0.8  PROT  --  6.0*  ALBUMIN  --  3.8   No results for input(s): LIPASE, AMYLASE in the last 8760 hours. No results for input(s): AMMONIA in the last 8760 hours. CBC:  Recent Labs  08/03/16  WBC 8.7  HGB 15.3  HCT 45  PLT 205   Cardiac Enzymes: No results for input(s): CKTOTAL, CKMB, CKMBINDEX, TROPONINI in the last 8760 hours. BNP: Invalid input(s): POCBNP Lab Results  Component Value Date   HGBA1C 6.9 (H) 06/20/2017   Lab Results  Component Value Date   TSH 3.34 08/03/2016   Lipid Panel:  Recent Labs  08/03/16 06/20/17 0740  CHOL 128 122  HDL 38 34*  LDLCALC 59 63  TRIG 155 125  CHOLHDL  --  3.6   Lab Results  Component Value Date   HGBA1C 6.9 (H) 06/20/2017    Procedures since last visit: No results found.  Assessment/Plan  1. Chronic atrial fibrillation (HCC) Controlled heart rate. Continue digoxin and rivaroxaban.  2. Coronary artery disease involving native coronary artery of native heart without angina pectoris Chest pain free. Continue olmesartan and atorvastatin. Followed by cardiology. Exercise counselling.   3. Essential hypertension Controlled BP, continue olmesartan 40 mg daily. Reviewed BMP  4. Hyperlipidemia, unspecified hyperlipidemia type LDL a t goal. Continue atorvastatin 10 mg daily  5. Long term current use of anticoagulant therapy No bleed reported besides his chronic gum bleed. Continue xarelto  6. BPH with elevated PSA Send lab result to urology office. Per pt PSA has been elevated in past. Continue finasteride with tamsulosin and tolterodine  7. Right wrist  pain Post injury, appears to have inflammation with repetitive injury from movement. Advised to wear wrist splint all the time, ice pack tid and naproxen 500 mg bid x 1 week with meals. Monitor for  bleed. If no improvement refer to orthopedics.   8. Type 2 diabetes mellitus with neurological manifestations, controlled ([place]) a1c 6.9 down from 7.3 in past. He has high cardiovascular risk with history of CAD, HTN, HLD and afib. Pt does not want medication at present. Lifestyle change for now. He will cut down on his dessert intake. Encouraged brisk walk for 30 minutes 5 days a week. Recheck a1c in 3 month and check for nephropathy. uptodate with eye exam. Negative for retinopathy.  - Hemoglobin A1c; Future - Microalbumin/Creatinine Ratio, Urine; Future    Labs/tests ordered:  As above  Next appointment: 3 month or earlier if needed  Communication:  I spent 40 minutes in total face-to-face time with the patient, more than 50% of which was spent in counseling and coordination of care, reviewing test results, reviewing medication and discussing or reviewing the diagnosis with patient.      Blanchie Serve, MD Internal Medicine [facility] [address] [place],  [address] Cell Phone (Monday-Friday 8 am - 5 pm): [phone] On Call: [phone] and follow prompts after 5 pm and on weekends Office Phone: [phone] Office Fax: [phone]

## 2017-06-30 ENCOUNTER — Encounter: Payer: Self-pay | Admitting: Internal Medicine

## 2017-07-12 DIAGNOSIS — L218 Other seborrheic dermatitis: Secondary | ICD-10-CM | POA: Diagnosis not present

## 2017-08-10 DIAGNOSIS — Z23 Encounter for immunization: Secondary | ICD-10-CM | POA: Diagnosis not present

## 2017-08-29 DIAGNOSIS — Z7901 Long term (current) use of anticoagulants: Secondary | ICD-10-CM | POA: Diagnosis not present

## 2017-08-29 DIAGNOSIS — E785 Hyperlipidemia, unspecified: Secondary | ICD-10-CM | POA: Diagnosis not present

## 2017-08-29 DIAGNOSIS — I482 Chronic atrial fibrillation: Secondary | ICD-10-CM | POA: Diagnosis not present

## 2017-08-29 DIAGNOSIS — I2510 Atherosclerotic heart disease of native coronary artery without angina pectoris: Secondary | ICD-10-CM | POA: Diagnosis not present

## 2017-09-09 DIAGNOSIS — E1149 Type 2 diabetes mellitus with other diabetic neurological complication: Secondary | ICD-10-CM | POA: Diagnosis not present

## 2017-09-12 LAB — HEMOGLOBIN A1C
EAG (MMOL/L): 7.1 (calc)
Hgb A1c MFr Bld: 6.1 % of total Hgb — ABNORMAL HIGH (ref <5.7)
Mean Plasma Glucose: 128 (calc)

## 2017-09-12 LAB — MICROALBUMIN / CREATININE URINE RATIO
Creatinine, Urine: 81 mg/dL (ref 20–320)
MICROALB/CREAT RATIO: 4 ug/mg{creat} (ref <30)
Microalb, Ur: 0.3 mg/dL

## 2017-09-21 ENCOUNTER — Non-Acute Institutional Stay: Payer: Medicare Other | Admitting: Internal Medicine

## 2017-09-21 ENCOUNTER — Encounter: Payer: Self-pay | Admitting: Internal Medicine

## 2017-09-21 VITALS — BP 110/62 | HR 94 | Temp 97.6°F | Resp 16 | Ht 70.0 in | Wt 176.8 lb

## 2017-09-21 DIAGNOSIS — E1149 Type 2 diabetes mellitus with other diabetic neurological complication: Secondary | ICD-10-CM

## 2017-09-21 DIAGNOSIS — I482 Chronic atrial fibrillation, unspecified: Secondary | ICD-10-CM

## 2017-09-21 DIAGNOSIS — Z7901 Long term (current) use of anticoagulants: Secondary | ICD-10-CM

## 2017-09-21 DIAGNOSIS — I10 Essential (primary) hypertension: Secondary | ICD-10-CM | POA: Diagnosis not present

## 2017-09-21 DIAGNOSIS — R972 Elevated prostate specific antigen [PSA]: Secondary | ICD-10-CM | POA: Diagnosis not present

## 2017-09-21 DIAGNOSIS — I2510 Atherosclerotic heart disease of native coronary artery without angina pectoris: Secondary | ICD-10-CM | POA: Diagnosis not present

## 2017-09-21 DIAGNOSIS — E785 Hyperlipidemia, unspecified: Secondary | ICD-10-CM | POA: Diagnosis not present

## 2017-09-21 DIAGNOSIS — J988 Other specified respiratory disorders: Secondary | ICD-10-CM

## 2017-09-21 DIAGNOSIS — N400 Enlarged prostate without lower urinary tract symptoms: Secondary | ICD-10-CM | POA: Diagnosis not present

## 2017-09-21 DIAGNOSIS — M19041 Primary osteoarthritis, right hand: Secondary | ICD-10-CM

## 2017-09-21 MED ORDER — LORATADINE 10 MG PO TABS: 10.0000 mg | ORAL_TABLET | Freq: Every day | ORAL | 11 refills | Status: DC

## 2017-09-21 MED ORDER — ACETAMINOPHEN 500 MG PO TABS: ORAL_TABLET | ORAL | 0 refills | Status: DC

## 2017-09-21 NOTE — Progress Notes (Signed)
[facility]  Provider: Blanchie Serve MD   Location:  [place] of Service:  Clinic (12)  PCP: Blanchie Serve, MD Patient Care Team: Blanchie Serve, MD as PCP - General (Internal Medicine)  Extended Emergency Contact Information Primary Emergency Contact: [facility] Address: [address]          [place], [place] [address] Johnnette Litter of [place] Mobile Phone: [phone] Relation: Daughter   Goals of Care: Advanced Directive information Advanced Directives 05/08/2017  Does Patient Have a Medical Advance Directive? Yes  Type of Paramedic of [place];Living will  Copy of [place] in Chart? -      Chief Complaint  Patient presents with  . Medical Management of Chronic Issues    3 month follow up  . Medication Refill    No refills needed at this time.   [name] Results    Discuss labs     HPI: Patient is a 83 y.o. male seen today for routine visit. He has noticed occasional change in his voice with congestion to his nose and throat. Denies sore throat or runny nose. No trouble swallowing.   afib- controlled HR. Denies palpitation. Followed by Dr Oran Rein, last seen few weeks back. Currently on digoxin and rivaroxaban. No note for review.  Right wrist and hand pain- with repetitive injury from helping with assistance with ADLs for his wife. No known recent injury. Denies nay numbness or tingling. Denies any swelling or redness. Currently not taking anything for pain. Last took naproxen 4-5 weeks back.   Hypertension- denies any complaints. Currently on olmesartan, no complaints.  Type 2 diabetes- no fall reported, no sores or wound to feet. Denies any callus or corn. Diet controlled.   CAD- chest pain free. S/p stent in 2004. Currently on olmesartan and atorvastatin  BPH- nocturia present. No incontinence. Taking finasteride and tamsulosin with detrol. Followed by [facility].   Past  Medical History:  Diagnosis Date  . BPH (benign prostatic hyperplasia)   . CAD (coronary artery disease)   . Chronic atrial fibrillation (HCC)    CHA2DS2VASC score 5    . Contact dermatitis   . DM neuropathy, type II diabetes mellitus ([place])   . Elevated PSA   . HLD (hyperlipidemia)   . HTN (hypertension) 02/15/2017  . Neuropathy   . Onychomycosis of toenail    Past Surgical History:  Procedure Laterality Date  . CARPAL TUNNEL WITH CUBITAL TUNNEL Right 2010  . CATARACT EXTRACTION W/ INTRAOCULAR LENS  IMPLANT, BILATERAL Bilateral 2003  . LUMBAR LAMINECTOMY  2001   L5  . TONSILLECTOMY AND ADENOIDECTOMY  1956  . tooth implant  2014    reports that he has quit smoking. he has never used smokeless tobacco. He reports that he does not drink alcohol or use drugs. Social History   Socioeconomic History  . Marital status: Married    Spouse name: Not on file  . Number of children: Not on file  . Years of education: Not on file  . Highest education level: Not on file  Social Needs  . Financial resource strain: Not on file  . Food insecurity - worry: Not on file  . Food insecurity - inability: Not on file  . Transportation needs - medical: Not on file  . Transportation needs - non-medical: Not on file  Occupational History  . Occupation: retired Museum/gallery conservator  Tobacco Use  . Smoking status: Former Research scientist (life sciences)  .  Smokeless tobacco: Never Used  Substance and Sexual Activity  . Alcohol use: No  . Drug use: No  . Sexual activity: Not on file  Other Topics Concern  . Not on file  Social History Narrative   Moved to [facility] 01/07/2017   No Tobacco use per day now. Used to smoke 1 pack per week for 10 years about 40-45 years ago.    No alcohol use.    Sometimes drinks/eats things with caffeine.    Married in [place] and lives in a 3 story apartment building     Current or past profession; Main frame Museum/gallery conservator.    Exercise, no/yes- plays golf once a  week.    Has a living will, DNR, and POA/HPOA.    Functional Status Survey:    Family History  Problem Relation Age of Onset  . Heart failure Mother   . Heart attack Father     Health Maintenance  Topic Date Due  . PNA vac Low Risk Adult (2 of 2 - PCV13) 08/16/2012  . FOOT EXAM  11/01/2017 (Originally 01/01/1943)  . OPHTHALMOLOGY EXAM  11/01/2017 (Originally 01/01/1943)  . TETANUS/TDAP  11/01/2017 (Originally 11/01/2016)  . HEMOGLOBIN A1C  03/09/2018  . INFLUENZA VACCINE  Completed    No Known Allergies  Outpatient Encounter Medications as of 09/21/2017  Medication Sig  . atorvastatin (LIPITOR) 10 MG tablet Take 10 mg by mouth daily.  . digoxin (LANOXIN) 0.125 MG tablet Take 0.125 mg by mouth daily.   . finasteride (PROSCAR) 5 MG tablet Take 5 mg by mouth daily.  [name] olmesartan (BENICAR) 40 MG tablet Take 40 mg by mouth daily.  . rivaroxaban (XARELTO) 20 MG TABS tablet Take 20 mg by mouth daily.  . tamsulosin (FLOMAX) 0.4 MG CAPS capsule Take 0.4 mg by mouth daily.  [name] tolterodine (DETROL LA) 4 MG 24 hr capsule Take 4 mg by mouth daily.  . [DISCONTINUED] naproxen (NAPROSYN) 500 MG tablet Take 500 mg by mouth as needed for mild pain.  [name] acetaminophen (TYLENOL) 500 MG tablet 1-2 tablet every 12 hours for pain  . loratadine (CLARITIN) 10 MG tablet Take 1 tablet (10 mg total) by mouth daily.  . [DISCONTINUED] naproxen (NAPROSYN) 500 MG tablet TAKE 1 TABLET(500 MG) BY MOUTH TWICE DAILY WITH A MEAL   No facility-administered encounter medications on file as of 09/21/2017.     Review of Systems  Constitutional: Negative for appetite change, chills and fever.  HENT: Negative for congestion, postnasal drip, sore throat and trouble swallowing.   Respiratory: Negative for cough, shortness of breath and wheezing.   Cardiovascular: Negative for chest pain, palpitations and leg swelling.  Gastrointestinal: Negative for abdominal pain, blood in stool, constipation, diarrhea, nausea and  vomiting.  Genitourinary: Positive for frequency. Negative for dysuria and hematuria.  Musculoskeletal: Positive for arthralgias and back pain. Negative for gait problem.  Skin: Negative for rash.  Neurological: Negative for dizziness and headaches.  Hematological: Bruises/bleeds easily.  Psychiatric/Behavioral: Negative for behavioral problems, confusion, dysphoric mood and sleep disturbance.    Vitals:   09/21/17 1100  BP: 110/62  Pulse: 94  Resp: 16  Temp: 97.6 F (36.4 C)  TempSrc: Oral  SpO2: 98%  Weight: 176 lb 12.8 oz (80.2 kg)  Height: '5\' 10"'  (1.778 m)   Body mass index is 25.37 kg/m. Physical Exam  Constitutional: He is oriented to person, place, and time. He appears well-developed and well-nourished. No distress.  HENT:  Head: Normocephalic and atraumatic.  Mouth/Throat: Oropharynx is clear and moist.  Eyes: EOM are normal. Pupils are equal, round, and reactive to light. Right eye exhibits no discharge. Left eye exhibits no discharge.  Neck: Normal range of motion. Neck supple.  Cardiovascular:  Murmur heard. Irregular HR  Pulmonary/Chest: Effort normal and breath sounds normal. No respiratory distress. He has no wheezes. He has no rales.  Abdominal: Soft. Bowel sounds are normal. There is no tenderness. There is no guarding.  Musculoskeletal: He exhibits no edema or tenderness.  Able to move all 4 extremities, wrist brace to right hand, pain to right carpometacarpal joint area with movement, no swelling. Arthritis changes present  Lymphadenopathy:    He has no cervical adenopathy.  Neurological: He is alert and oriented to person, place, and time.  Skin: Skin is warm and dry. No rash noted. He is not diaphoretic.  Psychiatric: He has a normal mood and affect. His behavior is normal.    Labs reviewed: Basic Metabolic Panel: Recent Labs    06/20/17 0740  NA 139  K 4.3  CL 105  CO2 25  GLUCOSE 123*  BUN 18  CREATININE 1.00  CALCIUM 8.8   Liver  Function Tests: Recent Labs    06/20/17 0740  AST 13  ALT 16  ALKPHOS 53  BILITOT 0.8  PROT 6.0*  ALBUMIN 3.8   No results for input(s): LIPASE, AMYLASE in the last 8760 hours. No results for input(s): AMMONIA in the last 8760 hours. CBC: No results for input(s): WBC, NEUTROABS, HGB, HCT, MCV, PLT in the last 8760 hours. Cardiac Enzymes: No results for input(s): CKTOTAL, CKMB, CKMBINDEX, TROPONINI in the last 8760 hours. BNP: Invalid input(s): POCBNP Lab Results  Component Value Date   HGBA1C 6.1 (H) 09/09/2017   Lab Results  Component Value Date   TSH 3.34 08/03/2016   No results found for: VITAMINB12 No results found for: FOLATE No results found for: IRON, TIBC, FERRITIN  Lipid Panel: Recent Labs    06/20/17 0740  CHOL 122  HDL 34*  LDLCALC 63  TRIG 125  CHOLHDL 3.6   Lab Results  Component Value Date   HGBA1C 6.1 (H) 09/09/2017    Procedures since last visit: No results found.  Assessment/Plan  1. Essential hypertension Controlled. Reviewed lab. Continue olmesartan - BMP with eGFR; Future - CBC (no diff); Future - Lipid Panel; Future  2. Type 2 diabetes mellitus with neurological manifestations, controlled ([place]) Diet controlled. Negative for microalbuminuria. Normal foot exam. Reviewed a1c.  - CBC (no diff); Future - Lipid Panel; Future - Hemoglobin A1c; Future  3. BPH with elevated PSA Continue detrol with finasteride and flomax.   4. Hyperlipidemia, unspecified hyperlipidemia type Continue atorvastatin - Lipid Panel; Future - Hemoglobin A1c; Future  5. Long term current use of anticoagulant therapy Continue rivaroxaban, monitor for bleed  6. Chronic atrial fibrillation (HCC) Controlled HR. Continue xarelto and digoxin.   7. Coronary artery disease involving native coronary artery of native heart without angina pectoris Chest pain free. Continue ARB and followed by cardiology.  8. Arthritis of right hand Xray has ruled out fracture.  Pain atleast since July 2018. Advised to take tylenol extra strength 500 mg 1--2 tab bid for now with ice pack. If no improvement, make orthopedic referral for possible steroid injection. Continue to wear wrist brace. Advised to obtain help for care for his wife as he is the sole caregiver and uses the painful hand to help wheel her around, lift and transfer her.  9. Congestion of upper airway - loratadine (CLARITIN) 10 MG tablet; Take 1 tablet (10 mg total) by mouth daily.  Dispense: 30 tablet; Refill: 11    Labs/tests ordered:  As above  Next appointment: 3 moth MMSE, physical  Communication: reviewed care plan with patient    Blanchie Serve, MD Internal Medicine [place], [place] [address] Cell Phone (Monday-Friday 8 am - 5 pm): [phone] On Call: [phone] and follow prompts after 5 pm and on weekends Office Phone: [phone] Office Fax: [phone]

## 2017-09-21 NOTE — Patient Instructions (Signed)
  Take tylenol 500 mg 1-2 tablet twice a day for your right hand pain atleast for 2 weeks. Also use ice pack where the pain is 2-3 times a day. Wear your wrist support brace as of now. If no improvement or pain worsens in 2 weeks period, please let us know.  Take loratadine once a day for your voice change with congestion for atleast 2 weeks. If no improvement, let us know.

## 2017-10-13 DIAGNOSIS — R972 Elevated prostate specific antigen [PSA]: Secondary | ICD-10-CM | POA: Diagnosis not present

## 2017-10-17 DIAGNOSIS — R972 Elevated prostate specific antigen [PSA]: Secondary | ICD-10-CM | POA: Diagnosis not present

## 2017-10-17 DIAGNOSIS — N401 Enlarged prostate with lower urinary tract symptoms: Secondary | ICD-10-CM | POA: Diagnosis not present

## 2017-10-17 DIAGNOSIS — R351 Nocturia: Secondary | ICD-10-CM | POA: Diagnosis not present

## 2017-10-26 ENCOUNTER — Encounter: Payer: Self-pay | Admitting: Internal Medicine

## 2017-10-26 ENCOUNTER — Non-Acute Institutional Stay (INDEPENDENT_AMBULATORY_CARE_PROVIDER_SITE_OTHER): Payer: Medicare Other | Admitting: Internal Medicine

## 2017-10-26 VITALS — BP 130/64 | HR 61 | Temp 97.4°F | Resp 18 | Ht 71.0 in | Wt 177.4 lb

## 2017-10-26 DIAGNOSIS — Z0000 Encounter for general adult medical examination without abnormal findings: Secondary | ICD-10-CM | POA: Diagnosis not present

## 2017-10-26 MED ORDER — PNEUMOCOCCAL 13-VAL CONJ VACC IM SUSP: 0.5000 mL | INTRAMUSCULAR | 0 refills | Status: AC

## 2017-10-26 MED ORDER — ZOSTER VAC RECOMB ADJUVANTED 50 MCG/0.5ML IM SUSR: 0.5000 mL | Freq: Once | INTRAMUSCULAR | 0 refills | Status: AC

## 2017-10-26 NOTE — Patient Instructions (Signed)
  Remember to bring your living will and HCPOA paperwork on your next visit.

## 2017-10-26 NOTE — Progress Notes (Signed)
Subjective:   Ryan Cochran. is a 83 y.o. male who presents for an Initial Medicare Annual Wellness Visit.  Cardiac Risk Factors include: advanced age (>92men, >3 women);dyslipidemia;hypertension;male gender;sedentary lifestyle;smoking/ tobacco exposure    Objective:    Today's Vitals   10/26/17 1159  BP: 130/64  Pulse: 61  Resp: 18  Temp: (!) 97.4 F (36.3 C)  TempSrc: Oral  SpO2: 98%  Weight: 177 lb 6.4 oz (80.5 kg)  Height: 5\' 11"  (1.803 m)   Body mass index is 24.74 kg/m.  Advanced Directives 10/26/2017 05/08/2017 02/15/2017  Does Patient Have a Medical Advance Directive? Yes Yes Yes  Type of Advance Directive Living will;Healthcare Power of [place];Living will [place];Living will  Does patient want to make changes to medical advance directive? No - Patient declined - -  Copy of [place] in Chart? No - copy requested - No - copy requested    Current Medications (verified) Outpatient Encounter Medications as of 10/26/2017  Medication Sig  . acetaminophen (TYLENOL) 500 MG tablet 1-2 tablet every 12 hours for pain  . atorvastatin (LIPITOR) 10 MG tablet Take 10 mg by mouth daily.  . digoxin (LANOXIN) 0.125 MG tablet Take 0.125 mg by mouth daily.   . finasteride (PROSCAR) 5 MG tablet Take 5 mg by mouth daily.  [name] loratadine (CLARITIN) 10 MG tablet Take 1 tablet (10 mg total) by mouth daily.  [name] olmesartan (BENICAR) 40 MG tablet Take 40 mg by mouth daily.  . rivaroxaban (XARELTO) 20 MG TABS tablet Take 20 mg by mouth daily.  . tamsulosin (FLOMAX) 0.4 MG CAPS capsule Take 0.4 mg by mouth daily.  [name] tolterodine (DETROL LA) 4 MG 24 hr capsule Take 4 mg by mouth daily.  . pneumococcal 13-valent conjugate vaccine (PREVNAR 13) SUSP injection Inject 0.5 mLs into the muscle tomorrow at 10 am for 1 dose.  [name] Zoster Vaccine Adjuvanted [facility]) injection Inject 0.5 mLs into the muscle once for 1 dose.   No  facility-administered encounter medications on file as of 10/26/2017.     Allergies (verified) Patient has no known allergies.   History: Past Medical History:  Diagnosis Date  . BPH (benign prostatic hyperplasia)   . CAD (coronary artery disease)   . Chronic atrial fibrillation (HCC)    CHA2DS2VASC score 5    . Contact dermatitis   . DM neuropathy, type II diabetes mellitus ([place])   . Elevated PSA   . HLD (hyperlipidemia)   . HTN (hypertension) 02/15/2017  . Neuropathy   . Onychomycosis of toenail    Past Surgical History:  Procedure Laterality Date  . CARPAL TUNNEL WITH CUBITAL TUNNEL Right 2010  . CATARACT EXTRACTION W/ INTRAOCULAR LENS  IMPLANT, BILATERAL Bilateral 2003  . LUMBAR LAMINECTOMY  2001   L5  . TONSILLECTOMY AND ADENOIDECTOMY  1956  . tooth implant  2014   Family History  Problem Relation Age of Onset  . Heart failure Mother   . Heart attack Father    Social History   Socioeconomic History  . Marital status: Married    Spouse name: None  . Number of children: None  . Years of education: None  . Highest education level: None  Social Needs  . Financial resource strain: Not hard at all  . Food insecurity - worry: Never true  . Food insecurity - inability: Never true  . Transportation needs - medical: No  . Transportation needs - non-medical: No  Occupational History  . Occupation: retired Museum/gallery conservator  Tobacco Use  . Smoking status: Former Research scientist (life sciences)  . Smokeless tobacco: Never Used  Substance and Sexual Activity  . Alcohol use: No  . Drug use: No  . Sexual activity: None  Other Topics Concern  . None  Social History Narrative   Moved to [facility] 01/07/2017   No Tobacco use per day now. Used to smoke 1 pack per week for 10 years about 40-45 years ago.    No alcohol use.    Sometimes drinks/eats things with caffeine.    Married in [place] and lives in a 3 story apartment building     Current or past profession; Main frame  Museum/gallery conservator.    Exercise, no/yes- plays golf once a week.    Has a living will, DNR, and POA/HPOA.   Tobacco Counseling Counseling given: Not Answered  Not smoking at present  Clinical Intake:  Pre-visit preparation completed: No  Pain : No/denies pain     BMI - recorded: 24.74 Nutritional Status: BMI of 19-24  Normal Nutritional Risks: None Diabetes: Yes(diet controlled) CBG done?: No Did pt. bring in CBG monitor from home?: No  How often do you need to have someone help you when you read instructions, pamphlets, or other written materials from your doctor or pharmacy?: 1 - Never What is the last grade level you completed in school?: college graduate BA degree  Interpreter Needed?: No  Information entered by :: Nickolaos Brallier  Activities of Daily Living In your present state of health, do you have any difficulty performing the following activities: 10/26/2017  Hearing? N  Vision? N  Difficulty concentrating or making decisions? N  Walking or climbing stairs? N  Dressing or bathing? N  Doing errands, shopping? N  Preparing Food and eating ? N  Using the Toilet? N  In the past six months, have you accidently leaked urine? Y  Do you have problems with loss of bowel control? N  Managing your Medications? N  Managing your Finances? N  Housekeeping or managing your Housekeeping? N  Some recent data might be hidden     Immunizations and Health Maintenance Immunization History  Administered Date(s) Administered  . Influenza, High Dose Seasonal PF 07/26/2017  . Influenza-Unspecified 08/03/2013, 09/01/2016  . Pneumococcal-Unspecified 11/01/1996, 08/17/2011  . Td 11/01/2006   Health Maintenance Due  Topic Date Due  . PNA vac Low Risk Adult (2 of 2 - PCV13) 08/16/2012    Patient Care Team: Blanchie Serve, MD as PCP - General (Internal Medicine)  Indicate any recent Medical Services you may have received from other than [facility] providers in the past  year (date may be approximate).  Urology with Dr Loel Lofty from [facility] Urology Cardiology Dr Wynonia Lawman Dentist- Dr Caryn Section Ophthalmology- Dr Corinna Capra    Assessment:   This is a routine wellness examination for Tavaris.  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 Exercise 150 min/wk Moderate Activity     Exercising to Stay Healthy Exercising regularly is important. It has many health benefits, such as:  Improving your overall fitness, flexibility, and endurance.  Increasing your bone density.  Helping with  weight control.  Decreasing your body fat.  Increasing your muscle strength.  Reducing stress and tension.  Improving your overall health.  In order to become healthy and stay healthy, it is recommended that you do moderate-intensity and vigorous-intensity exercise. You can tell that you are exercising at a moderate intensity if you have a higher heart rate and faster breathing, but you are still able to hold a conversation. You can tell that you are exercising at a vigorous intensity if you are breathing much harder and faster and cannot hold a conversation while exercising. How often should I exercise? Choose an activity that you enjoy and set realistic goals. Your health care provider can help you to make an activity plan that works for you. Exercise regularly as directed by your health care provider. This may include:  Doing resistance training twice each week, such as: ? Push-ups. ? Sit-ups. ? Lifting weights. ? Using resistance bands.  Doing a given intensity of exercise for a given amount of time. Choose  from these options: ? 150 minutes of moderate-intensity exercise every week. ? 75 minutes of vigorous-intensity exercise every week. ? A mix of moderate-intensity and vigorous-intensity exercise every week.  Children, pregnant women, people who are out of shape, people who are overweight, and older adults may need to consult a health care provider for individual recommendations. If you have any sort of medical condition, be sure to consult your health care provider before starting a new exercise program. What are some exercise ideas? Some moderate-intensity exercise ideas include:  Walking at a rate of 1 mile in 15 minutes.  Biking.  Hiking.  Golfing.  Dancing.  Some vigorous-intensity exercise ideas include:  Walking at a rate of at least 4.5 miles per hour.  Jogging or running at a rate of 5 miles per hour.  Biking at a rate of at least 10 miles per hour.  Lap swimming.  Roller-skating or in-line skating.  Cross-country skiing.  Vigorous competitive sports, such as football, basketball, and soccer.  Jumping rope.  Aerobic dancing.  What are some everyday activities that can help me to get exercise?  [place] work, such as: ? Pushing a Conservation officer, nature. ? Raking and bagging leaves.  Washing and waxing your car.  Pushing a stroller.  Shoveling snow.  Gardening.  Washing windows or floors. How can I be more active in my day-to-day activities?  Use the stairs instead of the elevator.  Take a walk during your lunch break.  If you drive, park your car farther away from work or school.  If you take public transportation, get off one stop early and walk the rest of the way.  Make all of your phone calls while standing up and walking around.  Get up, stretch, and walk around every 30 minutes throughout the day. What guidelines should I follow while exercising?  Do not exercise so much that you hurt yourself, feel dizzy, or get very short of breath.  Consult your  health care provider before starting a new exercise program.  Wear comfortable clothes and shoes with good support.  Drink plenty of water while you exercise to prevent dehydration or heat stroke. Body water is lost during exercise and must be replaced.  Work out until you breathe faster and your heart beats faster. This information is not intended to replace advice given to you by your health care provider. Make sure you discuss any questions you have with your health care provider. Document Released: 11/20/2010 Document Revised:  03/25/2016 Document Reviewed: 03/21/2014 Elsevier Interactive Patient Education  2018 [place] 2/9 Scores 10/26/2017 02/15/2017  PHQ - 2 Score 0 0    Fall Risk Fall Risk  10/26/2017 02/15/2017  Falls in the past year? No No  Risk for fall due to : Medication side effect -    Is the patient's home free of loose throw rugs in walkways, pet beds, electrical cords, etc?   yes      Grab bars in the bathroom? yes      Handrails on the stairs?   yes      Adequate lighting?   Yes   Timed Get Up and Go performed: 3 seconds  Cognitive Function: MMSE - Mini Mental State Exam 10/26/2017  Not completed: (No Data)  Orientation to time 5  Orientation to Place 5  Registration 3  Attention/ Calculation 5  Recall 2  Language- name 2 objects 2  Language- repeat 1  Language- follow 3 step command 3  Language- read & follow direction 1  Write a sentence 1  Copy design 1  Total score 29        Screening Tests Health Maintenance  Topic Date Due  . PNA vac Low Risk Adult (2 of 2 - PCV13) 08/16/2012  . FOOT EXAM  11/01/2017 (Originally 01/01/1943)  . OPHTHALMOLOGY EXAM  11/01/2017 (Originally 01/01/1943)  . TETANUS/TDAP  11/01/2017 (Originally 11/01/2016)  . HEMOGLOBIN A1C  03/09/2018  . INFLUENZA VACCINE  Completed    Qualifies for Shingles Vaccine? yes  Cancer Screenings: Lung: Low Dose CT Chest recommended if Age 57-80  years, 30 pack-year currently smoking OR have quit w/in 15years. Patient does not qualify. Colorectal: N/A  Additional Screenings: N/A Hepatitis B/HIV/Syphillis: Hepatitis C Screening:       Plan:      shingrix vaccine order to pharmacy  Pneumococcal vaccine 13 script to pharmacy  I have personally reviewed and noted the following in the patient's chart:   . Medical and social history . Use of alcohol, tobacco or illicit drugs  . Current medications and supplements . Functional ability and status . Nutritional status . Physical activity . Advanced directives . List of other physicians . Hospitalizations, surgeries, and ER visits in previous 12 months- none . Vitals . Screenings to include cognitive, depression, and falls . Referrals and appointments  In addition, I have reviewed and discussed with patient certain preventive protocols, quality metrics, and best practice recommendations. A written personalized care plan for preventive services as well as general preventive health recommendations were provided to patient.     Blanchie Serve, MD   10/26/2017

## 2017-12-19 DIAGNOSIS — I10 Essential (primary) hypertension: Secondary | ICD-10-CM

## 2017-12-19 DIAGNOSIS — E1149 Type 2 diabetes mellitus with other diabetic neurological complication: Secondary | ICD-10-CM | POA: Diagnosis not present

## 2017-12-19 DIAGNOSIS — E785 Hyperlipidemia, unspecified: Secondary | ICD-10-CM

## 2017-12-20 LAB — CBC
HCT: 44 % (ref 38.5–50.0)
HEMOGLOBIN: 15.4 g/dL (ref 13.2–17.1)
MCH: 31.8 pg (ref 27.0–33.0)
MCHC: 35 g/dL (ref 32.0–36.0)
MCV: 90.9 fL (ref 80.0–100.0)
MPV: 10.4 fL (ref 7.5–12.5)
Platelets: 205 10*3/uL (ref 140–400)
RBC: 4.84 10*6/uL (ref 4.20–5.80)
RDW: 13 % (ref 11.0–15.0)
WBC: 7.1 10*3/uL (ref 3.8–10.8)

## 2017-12-20 LAB — BASIC METABOLIC PANEL WITH GFR
BUN: 23 mg/dL (ref 7–25)
CO2: 28 mmol/L (ref 20–32)
Calcium: 9 mg/dL (ref 8.6–10.3)
Chloride: 106 mmol/L (ref 98–110)
Creat: 1.08 mg/dL (ref 0.70–1.11)
GFR, Est African American: 74 mL/min/{1.73_m2} (ref >=60)
GFR, Est Non African American: 64 mL/min/{1.73_m2} (ref >=60)
GLUCOSE: 130 mg/dL — AB (ref 65–99)
POTASSIUM: 4.6 mmol/L (ref 3.5–5.3)
SODIUM: 139 mmol/L (ref 135–146)

## 2017-12-20 LAB — HEMOGLOBIN A1C
HEMOGLOBIN A1C: 6.6 %{Hb} — AB (ref <5.7)
Mean Plasma Glucose: 143 (calc)
eAG (mmol/L): 7.9 (calc)

## 2017-12-20 LAB — LIPID PANEL
CHOL/HDL RATIO: 3.7 (calc) (ref <5.0)
Cholesterol: 117 mg/dL (ref <200)
HDL: 32 mg/dL — AB (ref >40)
LDL CHOLESTEROL (CALC): 58 mg/dL
NON-HDL CHOLESTEROL (CALC): 85 mg/dL (ref <130)
TRIGLYCERIDES: 197 mg/dL — AB (ref <150)

## 2017-12-28 ENCOUNTER — Encounter: Payer: Medicare Other | Admitting: Internal Medicine

## 2018-01-16 ENCOUNTER — Ambulatory Visit: Payer: Medicare Other | Admitting: Internal Medicine

## 2018-01-16 ENCOUNTER — Encounter: Payer: Self-pay | Admitting: Internal Medicine

## 2018-01-16 VITALS — BP 122/68 | HR 80 | Temp 97.7°F | Resp 18 | Ht 71.0 in | Wt 178.0 lb

## 2018-01-16 DIAGNOSIS — Z7901 Long term (current) use of anticoagulants: Secondary | ICD-10-CM

## 2018-01-16 DIAGNOSIS — I2510 Atherosclerotic heart disease of native coronary artery without angina pectoris: Secondary | ICD-10-CM

## 2018-01-16 DIAGNOSIS — J309 Allergic rhinitis, unspecified: Secondary | ICD-10-CM

## 2018-01-16 DIAGNOSIS — R972 Elevated prostate specific antigen [PSA]: Secondary | ICD-10-CM

## 2018-01-16 DIAGNOSIS — M19031 Primary osteoarthritis, right wrist: Secondary | ICD-10-CM | POA: Insufficient documentation

## 2018-01-16 DIAGNOSIS — I482 Chronic atrial fibrillation, unspecified: Secondary | ICD-10-CM

## 2018-01-16 DIAGNOSIS — N400 Enlarged prostate without lower urinary tract symptoms: Secondary | ICD-10-CM

## 2018-01-16 DIAGNOSIS — I10 Essential (primary) hypertension: Secondary | ICD-10-CM

## 2018-01-16 DIAGNOSIS — E782 Mixed hyperlipidemia: Secondary | ICD-10-CM

## 2018-01-16 DIAGNOSIS — Z0000 Encounter for general adult medical examination without abnormal findings: Secondary | ICD-10-CM

## 2018-01-16 DIAGNOSIS — Z23 Encounter for immunization: Secondary | ICD-10-CM

## 2018-01-16 DIAGNOSIS — E1149 Type 2 diabetes mellitus with other diabetic neurological complication: Secondary | ICD-10-CM | POA: Diagnosis not present

## 2018-01-16 HISTORY — DX: Primary osteoarthritis, right wrist: M19.031

## 2018-01-16 HISTORY — DX: Allergic rhinitis, unspecified: J30.9

## 2018-01-16 MED ORDER — ZOSTER VAC RECOMB ADJUVANTED 50 MCG/0.5ML IM SUSR: 0.5000 mL | Freq: Once | INTRAMUSCULAR | 0 refills | Status: DC

## 2018-01-16 MED ORDER — ATORVASTATIN CALCIUM 20 MG PO TABS: 20.0000 mg | ORAL_TABLET | Freq: Every day | ORAL | 3 refills | Status: DC

## 2018-01-16 MED ORDER — TETANUS-DIPHTH-ACELL PERTUSSIS 5-2-15.5 LF-MCG/0.5 IM SUSP: 0.5000 mL | Freq: Once | INTRAMUSCULAR | 0 refills | Status: AC

## 2018-01-16 MED ORDER — ZOSTER VAC RECOMB ADJUVANTED 50 MCG/0.5ML IM SUSR: 0.5000 mL | Freq: Once | INTRAMUSCULAR | 0 refills | Status: AC

## 2018-01-16 MED ORDER — TETANUS-DIPHTH-ACELL PERTUSSIS 5-2.5-18.5 LF-MCG/0.5 IM SUSP
0.5000 mL | Freq: Once | INTRAMUSCULAR | Status: DC
Start: 2018-01-16 — End: 2018-01-16

## 2018-01-16 NOTE — Progress Notes (Signed)
[facility]  Provider: Blanchie Serve MD   Location:      Place of Service:     PCP: Blanchie Serve, MD Patient Care Team: Blanchie Serve, MD as PCP - General (Internal Medicine)  Extended Emergency Contact Information Primary Emergency Contact: [facility] Address: [address]          [place], [place] [address] Johnnette Litter of [place] Mobile Phone: [phone] Relation: Daughter   Goals of Care: Advanced Directive information Advanced Directives 10/26/2017  Does Patient Have a Medical Advance Directive? Yes  Type of Advance Directive Living will;Healthcare Power of Attorney  Does patient want to make changes to medical advance directive? No - Patient declined  Copy of [place] in Chart? No - copy requested      Chief Complaint  Patient presents with  . Annual Exam    Patient wants to if he should continue to take OTC Tylenol. He stated that he hasn't taken any.  . Medication Refill    No refills needed at this time  . Results    Discuss results  . MMSE    30/30. Passed clock drawing    HPI: Patient is a 84 y.o. male seen today for annual exam. He takes care of his wife who has advanced dementia. He walks for exercise. He is careful in his diet- avoids sugar intake.   BPH- currently on finasteride 5 mg daily and flomax 0.4 mg daily.has good urine outflow, has some dribbling of urine. Denies hematuria or dysuria. Also on tolterodine and follows with Dr Karsten Ro at [facility].   Hypertension- denies headache, chest pain, shortness of breath and abdominal pain. Currently takes olmesartan 40 mg daily  afib- controlled HR. Denies palpitation. Followed by Dr Oran Rein. Currently on digoxin, atorvastatin and rivaroxaban. Occasional bleeding from gums. No other bleed reported. No fall reported.  Hyperlipidemia- takes atorvastatin, tolerating well, denies myalgia  Right wrist pain- overall controlled. Has not required any  tylenol recently. Denies any swelling or redness.   CAD- chest pain free. S/p stent in 2004. Currently on olmesartan and atorvastatin   Past Medical History:  Diagnosis Date  . BPH (benign prostatic hyperplasia)   . CAD (coronary artery disease)   . Chronic atrial fibrillation (HCC)    CHA2DS2VASC score 5    . Contact dermatitis   . DM neuropathy, type II diabetes mellitus ([place])   . Elevated PSA   . HLD (hyperlipidemia)   . HTN (hypertension) 02/15/2017  . Neuropathy   . Onychomycosis of toenail    Past Surgical History:  Procedure Laterality Date  . CARPAL TUNNEL WITH CUBITAL TUNNEL Right 2010  . CATARACT EXTRACTION W/ INTRAOCULAR LENS  IMPLANT, BILATERAL Bilateral 2003  . LUMBAR LAMINECTOMY  2001   L5  . TONSILLECTOMY AND ADENOIDECTOMY  1956  . tooth implant  2014    reports that he has quit smoking. he has never used smokeless tobacco. He reports that he does not drink alcohol or use drugs. Social History   Socioeconomic History  . Marital status: Married    Spouse name: Not on file  . Number of children: Not on file  . Years of education: Not on file  . Highest education level: Not on file  Social Needs  . Financial resource strain: Not hard at all  . Food insecurity - worry: Never true  . Food insecurity - inability: Never true  . Transportation needs - medical: No  . Transportation needs -  non-medical: No  Occupational History  . Occupation: retired Museum/gallery conservator  Tobacco Use  . Smoking status: Former Research scientist (life sciences)  . Smokeless tobacco: Never Used  Substance and Sexual Activity  . Alcohol use: No  . Drug use: No  . Sexual activity: Not on file  Other Topics Concern  . Not on file  Social History Narrative   Moved to [facility] 01/07/2017   No Tobacco use per day now. Used to smoke 1 pack per week for 10 years about 40-45 years ago.    No alcohol use.    Sometimes drinks/eats things with caffeine.    Married in [place] and lives in a 3 story  apartment building     Current or past profession; Main frame Museum/gallery conservator.    Exercise, no/yes- plays golf once a week.    Has a living will, DNR, and POA/HPOA.    Functional Status Survey:    Family History  Problem Relation Age of Onset  . Heart failure Mother   . Heart attack Father     Health Maintenance  Topic Date Due  . FOOT EXAM  01/01/1943  . OPHTHALMOLOGY EXAM  01/01/1943  . PNA vac Low Risk Adult (2 of 2 - PCV13) 08/16/2012  . TETANUS/TDAP  11/01/2016  . HEMOGLOBIN A1C  06/18/2018  . INFLUENZA VACCINE  Completed    No Known Allergies  Outpatient Encounter Medications as of 01/16/2018  Medication Sig  . acetaminophen (TYLENOL) 500 MG tablet 1-2 tablet every 12 hours for pain  . atorvastatin (LIPITOR) 10 MG tablet Take 10 mg by mouth daily.  . digoxin (LANOXIN) 0.125 MG tablet Take 0.125 mg by mouth daily.   . finasteride (PROSCAR) 5 MG tablet Take 5 mg by mouth daily.  [name] loratadine (CLARITIN) 10 MG tablet Take 1 tablet (10 mg total) by mouth daily.  [name] olmesartan (BENICAR) 40 MG tablet Take 40 mg by mouth daily.  . rivaroxaban (XARELTO) 20 MG TABS tablet Take 20 mg by mouth daily.  . tamsulosin (FLOMAX) 0.4 MG CAPS capsule Take 0.4 mg by mouth daily.  [name] tolterodine (DETROL LA) 4 MG 24 hr capsule Take 4 mg by mouth daily.   No facility-administered encounter medications on file as of 01/16/2018.     Review of Systems  Constitutional: Negative for appetite change, chills, diaphoresis, fatigue, fever and unexpected weight change.  HENT: Positive for tinnitus. Negative for congestion, ear discharge, ear pain, hearing loss, mouth sores, postnasal drip, sinus pressure, sinus pain, sore throat and trouble swallowing.        Occasional runny nose and sneezing, claritin helps  Eyes: Negative for pain, discharge, redness and itching.       Has corrective glasses, follows with ophthalmology, sees them once a year. History of cataract surgery in 2003    Respiratory: Negative for cough, choking, shortness of breath and wheezing.   Cardiovascular: Positive for palpitations. Negative for chest pain and leg swelling.       Occasional palpitation, sees Dr Oran Rein with history of CAD s/p stent and afib  Gastrointestinal: Negative for abdominal pain, anal bleeding, blood in stool, constipation, diarrhea, nausea, rectal pain and vomiting.  Endocrine: Negative for cold intolerance, heat intolerance, polydipsia and polyphagia.  Genitourinary: Positive for frequency and urgency. Negative for dysuria and hematuria.  Musculoskeletal: Positive for arthralgias and back pain. Negative for gait problem.       No fall reported  Skin: Negative for rash and wound.  Neurological: Negative for  dizziness, tremors, seizures, syncope, weakness, light-headedness, numbness and headaches.  Hematological: Bruises/bleeds easily.  Psychiatric/Behavioral: Negative for behavioral problems, confusion, decreased concentration, dysphoric mood, hallucinations and sleep disturbance.   Immunization History  Administered Date(s) Administered  . Influenza, High Dose Seasonal PF 07/26/2017  . Influenza-Unspecified 08/03/2013, 09/01/2016  . Pneumococcal-Unspecified 11/01/1996, 08/17/2011  . Td 11/01/2006   Diabetic Foot Exam - Simple   Simple Foot Form Diabetic Foot exam was performed with the following findings:  Yes 01/16/2018  3:07 PM  Visual Inspection No deformities, no ulcerations, no other skin breakdown bilaterally:  Yes Sensation Testing Intact to touch and monofilament testing bilaterally:  Yes Pulse Check Posterior Tibialis and Dorsalis pulse intact bilaterally:  Yes Comments     Vitals:   01/16/18 1410  BP: 122/68  Pulse: 80  Resp: 18  Temp: 97.7 F (36.5 C)  TempSrc: Oral  SpO2: 96%  Weight: 178 lb (80.7 kg)  Height: 5\' 11"  (1.803 m)   Body mass index is 24.83 kg/m.   Wt Readings from Last 3 Encounters:  01/16/18 178 lb (80.7 kg)  10/26/17 177 lb  6.4 oz (80.5 kg)  09/21/17 176 lb 12.8 oz (80.2 kg)   Physical Exam  Constitutional: He is oriented to person, place, and time. He appears well-developed and well-nourished. No distress.  HENT:  Head: Normocephalic and atraumatic.  Right Ear: External ear normal.  Left Ear: External ear normal.  Nose: Nose normal.  Mouth/Throat: Oropharynx is clear and moist. No oropharyngeal exudate.  Eyes: Conjunctivae and EOM are normal. Pupils are equal, round, and reactive to light. Right eye exhibits no discharge. Left eye exhibits no discharge. No scleral icterus.  Neck: Normal range of motion. Neck supple. No thyromegaly present.  Cardiovascular: Intact distal pulses.  Murmur heard. Irregular HR  Pulmonary/Chest: Effort normal and breath sounds normal. No respiratory distress. He has no wheezes. He has no rales.  Abdominal: Soft. Bowel sounds are normal. He exhibits no mass. There is no tenderness. There is no guarding.  Musculoskeletal: Normal range of motion. He exhibits no edema or tenderness.  Able to move all 4 extremities. Arthritis changes present  Lymphadenopathy:    He has no cervical adenopathy.  Neurological: He is alert and oriented to person, place, and time. He has normal reflexes. He displays normal reflexes. No cranial nerve deficit. He exhibits normal muscle tone.  01/16/18 MMSE 30/30, passed clock draw  Skin: Skin is warm and dry. No rash noted. He is not diaphoretic. No erythema. No pallor.  Psychiatric: He has a normal mood and affect. His behavior is normal. Judgment and thought content normal.    Labs reviewed: Basic Metabolic Panel: Recent Labs    06/20/17 0740 12/19/17 0820  NA 139 139  K 4.3 4.6  CL 105 106  CO2 25 28  GLUCOSE 123* 130*  BUN 18 23  CREATININE 1.00 1.08  CALCIUM 8.8 9.0   Liver Function Tests: Recent Labs    06/20/17 0740  AST 13  ALT 16  ALKPHOS 53  BILITOT 0.8  PROT 6.0*  ALBUMIN 3.8   No results for input(s): LIPASE, AMYLASE in  the last 8760 hours. No results for input(s): AMMONIA in the last 8760 hours. CBC: Recent Labs    12/19/17 0820  WBC 7.1  HGB 15.4  HCT 44.0  MCV 90.9  PLT 205   Cardiac Enzymes: No results for input(s): CKTOTAL, CKMB, CKMBINDEX, TROPONINI in the last 8760 hours. BNP: Invalid input(s): POCBNP Lab Results  Component Value  Date   HGBA1C 6.6 (H) 12/19/2017   Lab Results  Component Value Date   TSH 3.34 08/03/2016   No results found for: VITAMINB12 No results found for: FOLATE No results found for: IRON, TIBC, FERRITIN  Lipid Panel: Recent Labs    06/20/17 0740 12/19/17 0820  CHOL 122 117  HDL 34* 32*  LDLCALC 63 58  TRIG 125 197*  CHOLHDL 3.6 3.7   Lab Results  Component Value Date   HGBA1C 6.6 (H) 12/19/2017   09/09/17- urine microalbumin 0.3 with ratio 4. No microalbuminuria.  Procedures since last visit: No results found.  Assessment/Plan  1. Type 2 diabetes mellitus with neurological manifestations, controlled ([place]) a1c reviewed. Patient counselled on diet and activities/ exercise. Negative for microalbuminuria. Continue statin and ARB  - Hemoglobin A1c; Future - atorvastatin (LIPITOR) 20 MG tablet; Take 1 tablet (20 mg total) by mouth daily.  Dispense: 90 tablet; Refill: 3  2. Chronic atrial fibrillation (HCC) Continue digoxin and statin with xarelto.  - Digoxin level; Future - atorvastatin (LIPITOR) 20 MG tablet; Take 1 tablet (20 mg total) by mouth daily.  Dispense: 90 tablet; Refill: 3  3. Coronary artery disease involving native coronary artery of native heart without angina pectoris Chest pain free. S/p stent 2004. Continue olmesartan and atorvastatin - atorvastatin (LIPITOR) 20 MG tablet; Take 1 tablet (20 mg total) by mouth daily.  Dispense: 90 tablet; Refill: 3  4. Essential hypertension Continue olmesartan, lab reviewed.   5. BPH with elevated PSA Continue finasteride, flomax and detrol  6. Long term current use of anticoagulant  therapy Continue xarelto, skin care, monitor for bleed.   7. Mixed hyperlipidemia Increase atorvastatin to 20 mg daily from 10 mg daily, reviewed lipid panel - atorvastatin (LIPITOR) 20 MG tablet; Take 1 tablet (20 mg total) by mouth daily.  Dispense: 90 tablet; Refill: 3  8. Allergic rhinitis, unspecified seasonality, unspecified trigger Continue claritin, helpful  9. Osteoarthritis of right wrist, unspecified osteoarthritis type Tylenol as needed for now  10. Annual physical exam counselled on diet, exercise, skin care, use of sunscreen, regular eye and foot exam. Immunization, lab work, medication reviewed.  - Tdap (BOOSTRIX) injection 0.5 mL - Zoster Vaccine Adjuvanted [facility]) injection; Inject 0.5 mLs into the muscle once for 1 dose.  Dispense: 0.5 mL; Refill: 0  11. Need for immunization against tetanus alone Script to pharmacy - Tdap (BOOSTRIX) injection 0.5 mL  12. Need for zoster vaccination - Zoster Vaccine Adjuvanted [facility]) injection; Inject 0.5 mLs into the muscle once for 1 dose.  Dispense: 0.5 mL; Refill: 0   Labs/tests ordered:   Lab Orders     Digoxin level     Hemoglobin A1c  Next appointment: 3 months  Communication: reviewed care plan with patient    Blanchie Serve, MD Internal Medicine [place], [place] [address] Cell Phone (Monday-Friday 8 am - 5 pm): [phone] On Call: [phone] and follow prompts after 5 pm and on weekends Office Phone: [phone] Office Fax: [phone]

## 2018-02-22 IMAGING — CR DG WRIST COMPLETE 3+V*R*
4 series · 4 of 4 positions shown · non-contrast
Comparison: No comparison studies available.

CLINICAL DATA: Wrist pain and swelling.

EXAM:
RIGHT WRIST - COMPLETE 3+ VIEW

[x wrist pa right]
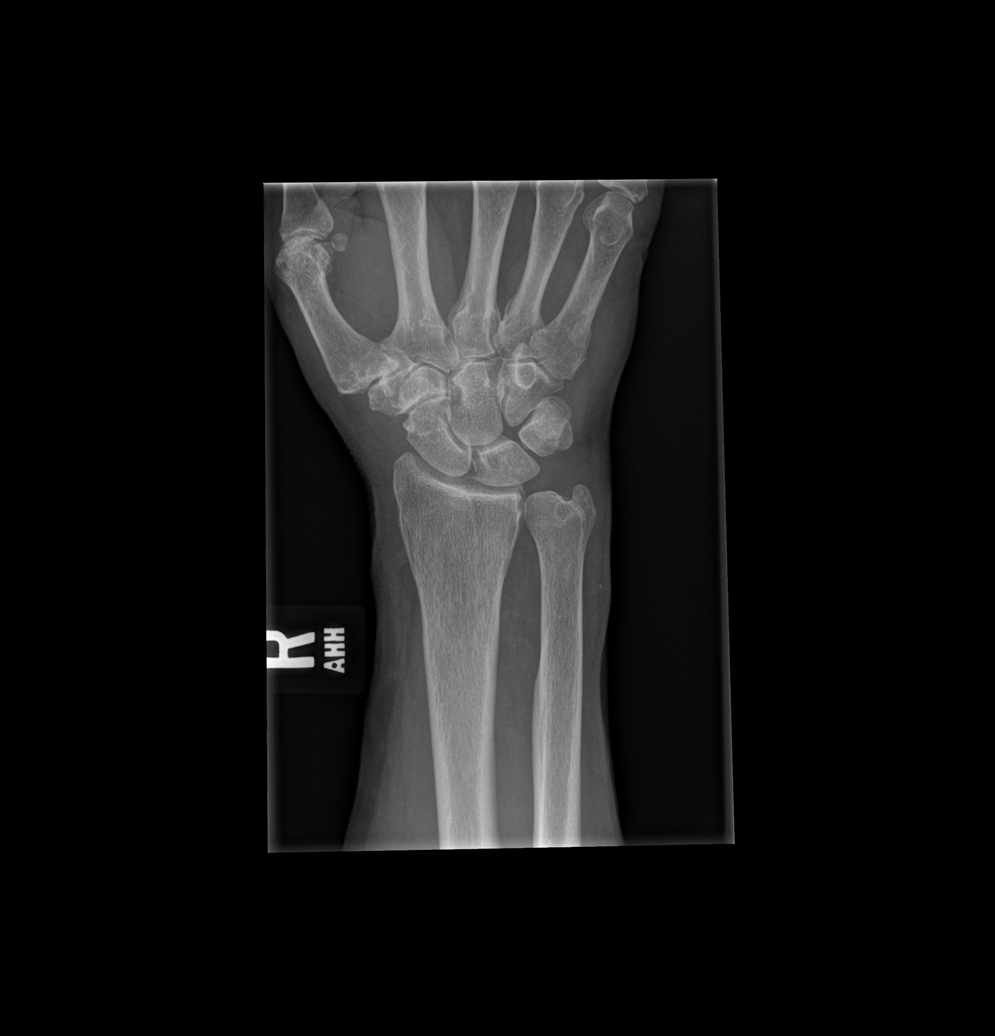

[x wrist obl right]
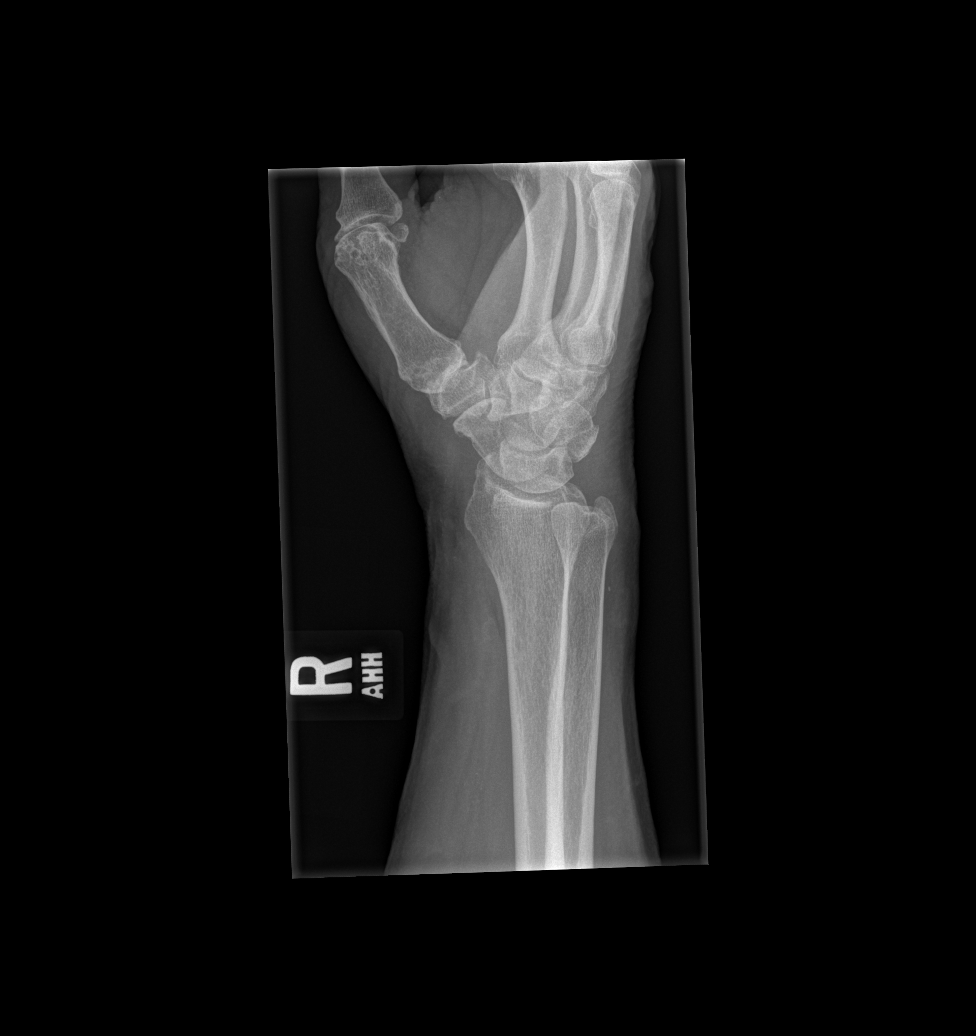

[x wrist lat right]
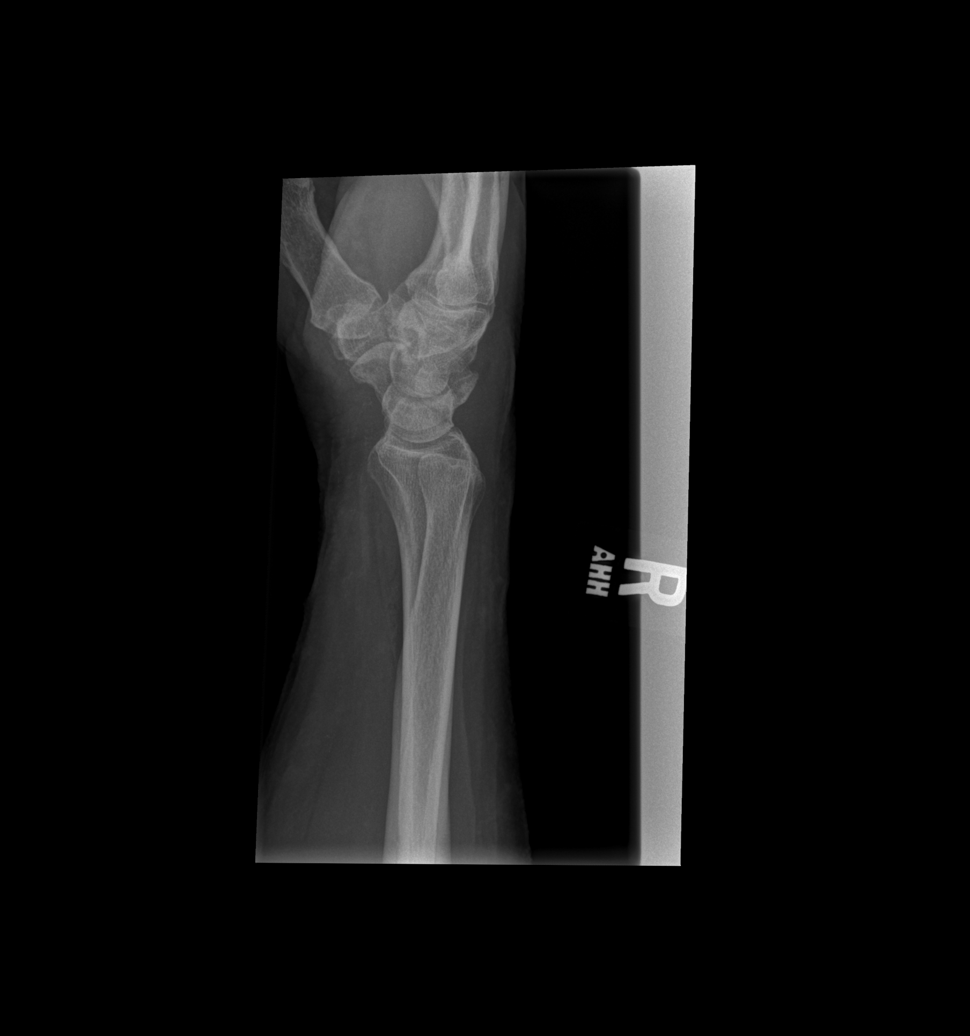

[x wrist navicular view right]
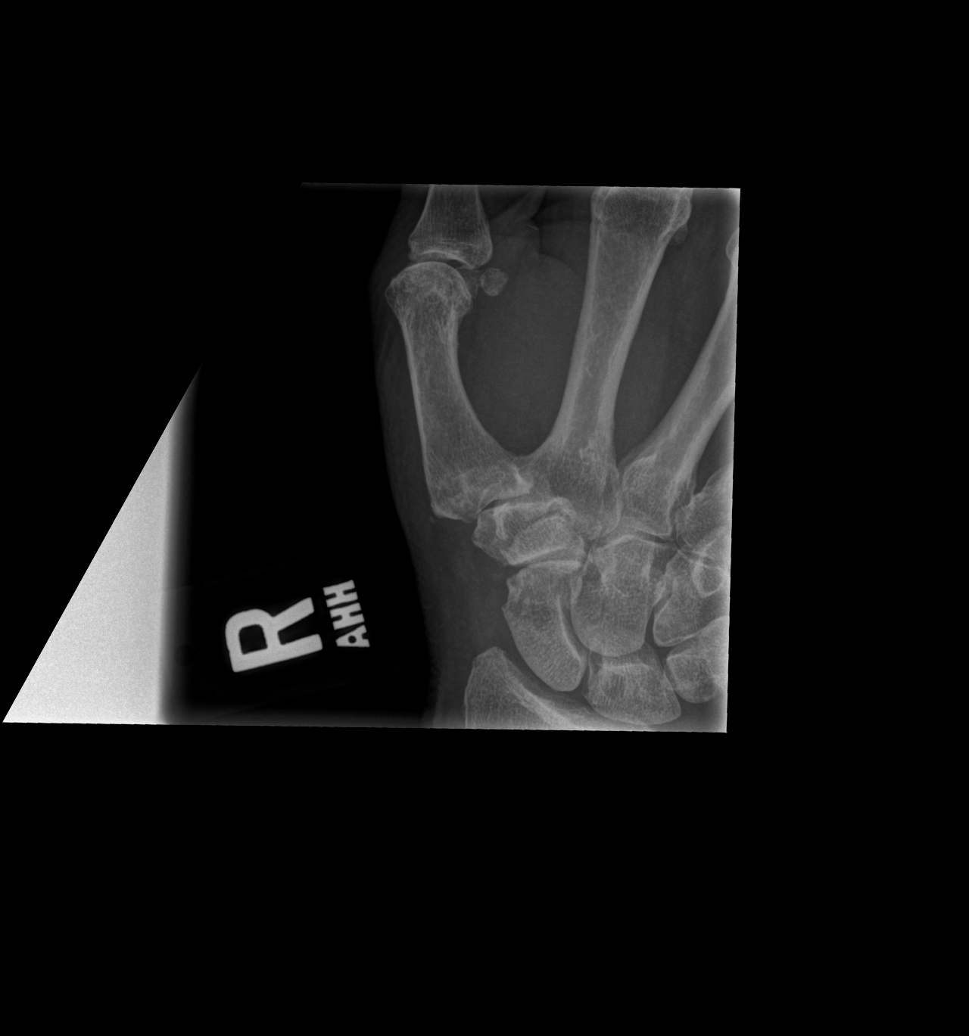

[4 of 4 positions shown; findings below may reference images not displayed]

FINDINGS: No evidence of fracture. No subluxation or dislocation. Degenerative
changes are seen in the radial carpus and first carpometacarpal
joint.
IMPRESSION: 1. No acute bony abnormality.
2. Degenerative changes in the lateral wrist and first
carpometacarpal joint.

## 2018-02-22 IMAGING — CR DG HAND COMPLETE 3+V*R*
3 series · 3 of 3 positions shown · non-contrast
Comparison: No comparison studies available.

CLINICAL DATA: Injury with pain.

EXAM:
RIGHT HAND - COMPLETE 3+ VIEW

[x hand pa right]
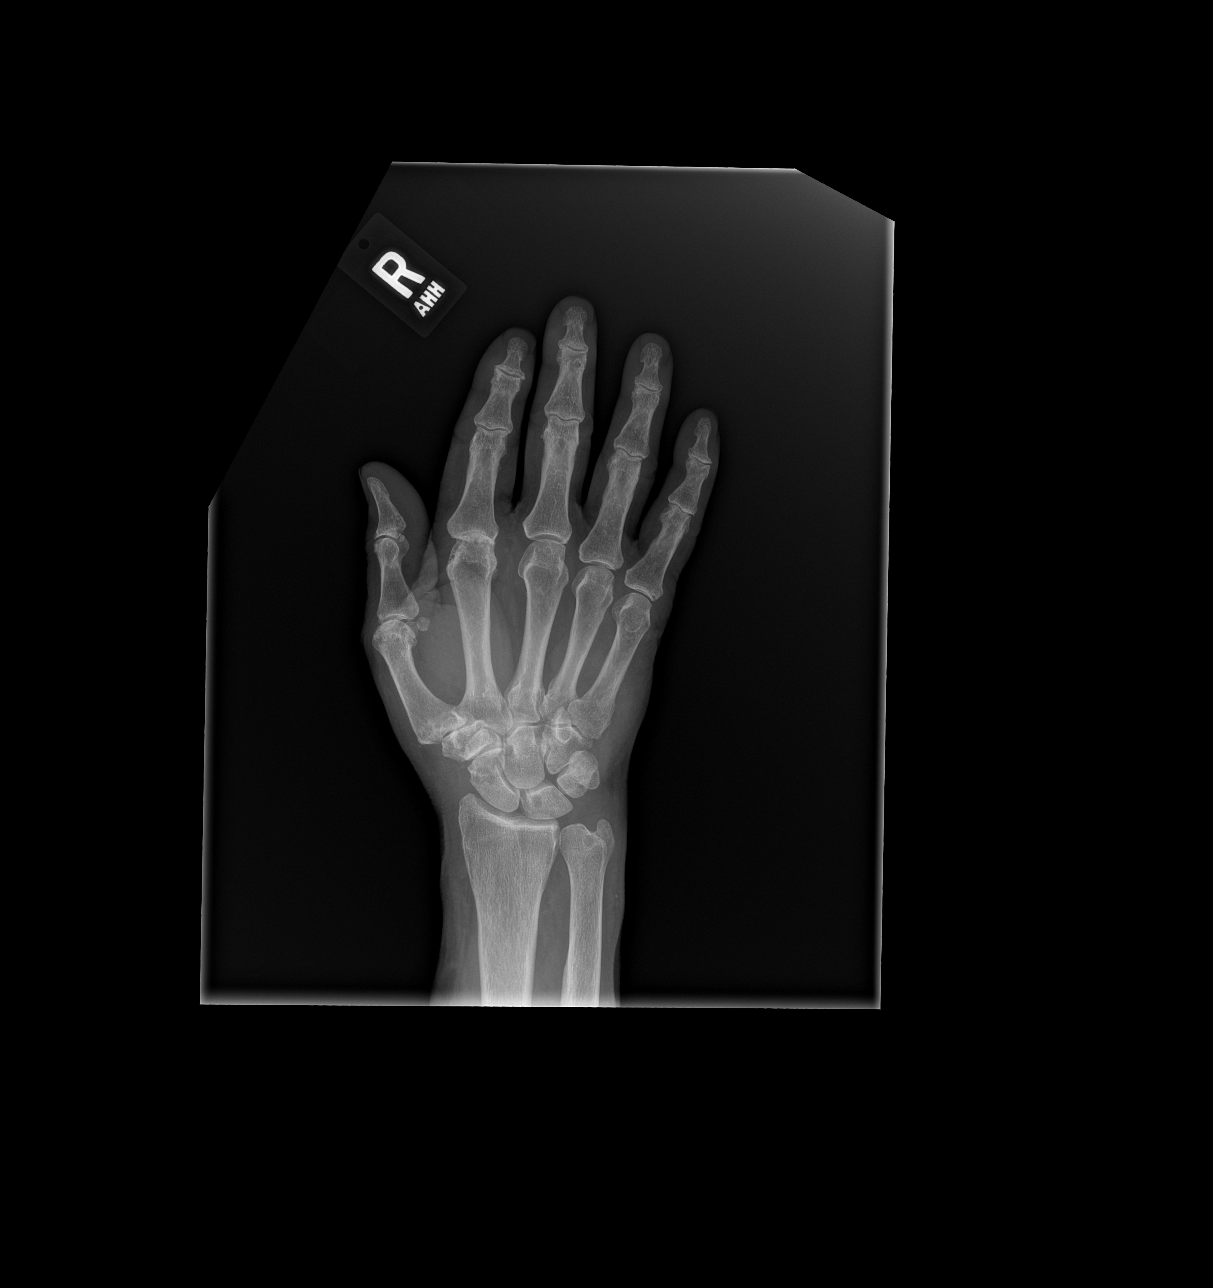

[x hand obl right]
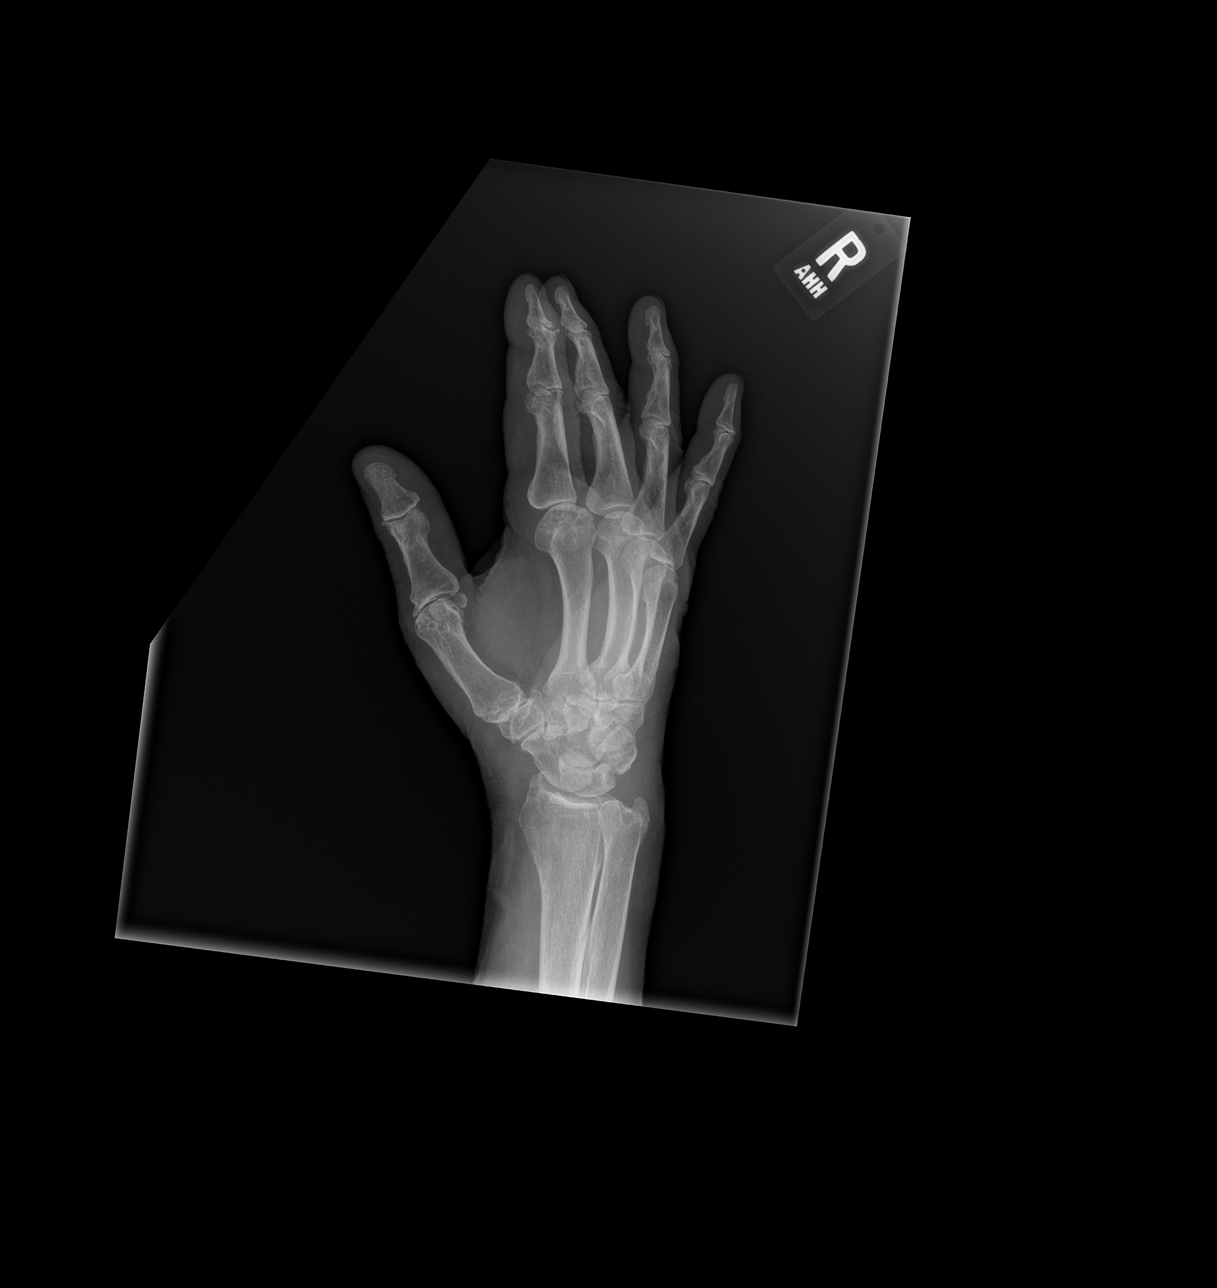

[x hand lat right]
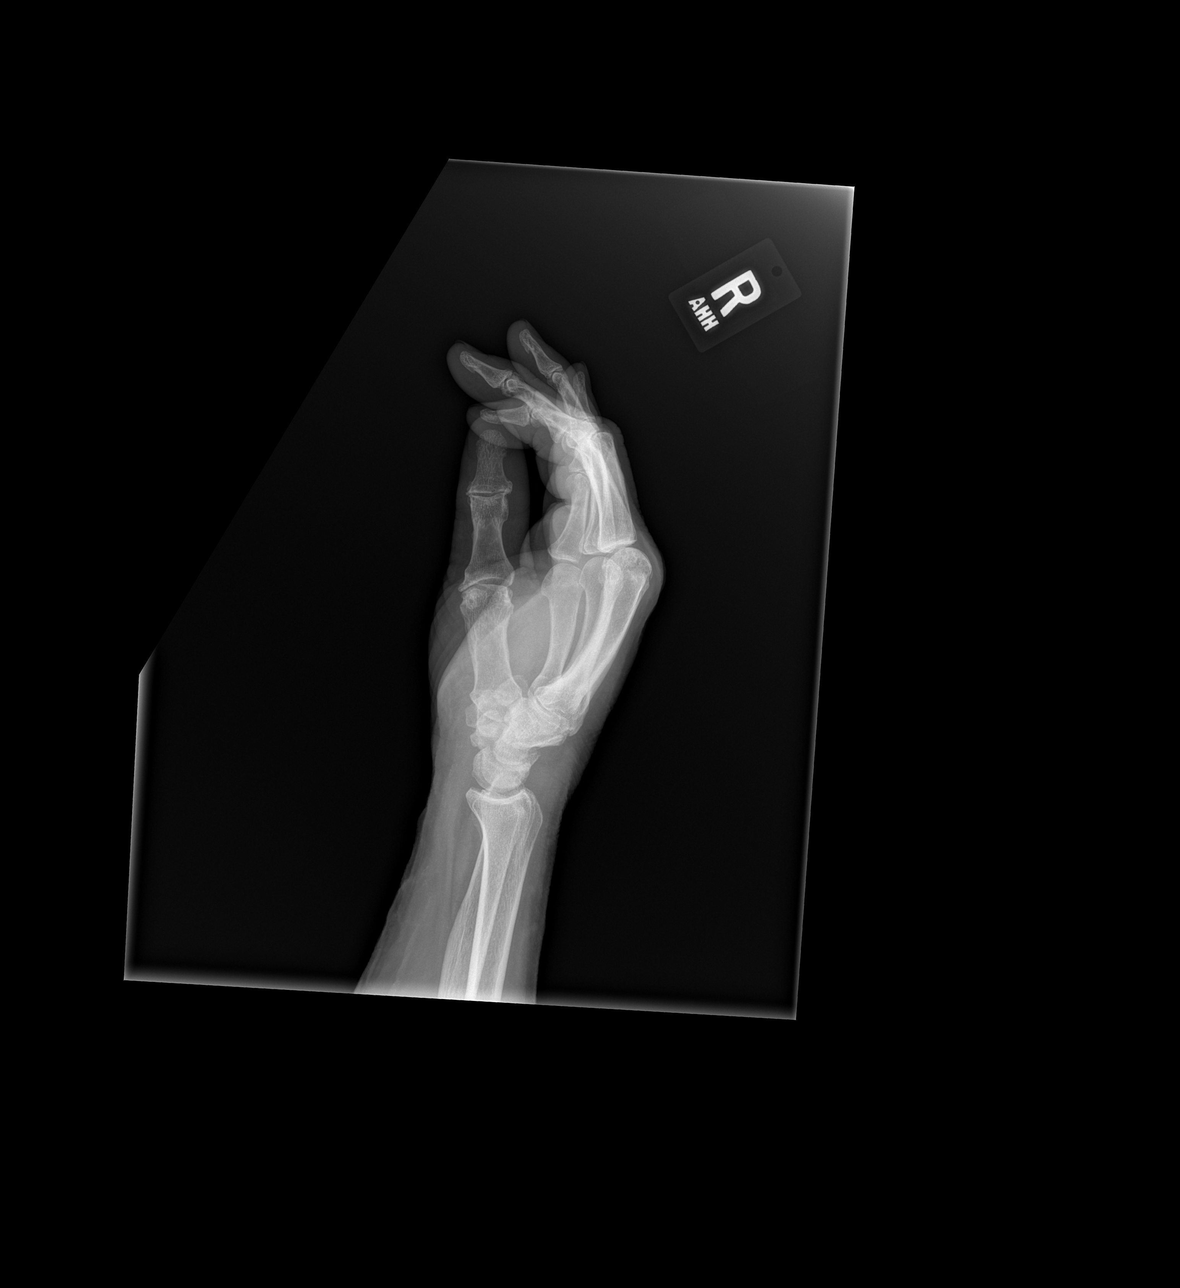

[3 of 3 positions shown; findings below may reference images not displayed]

FINDINGS: No fracture. No subluxation or dislocation. Degenerative changes are
noted in scattered IP joints, the MCP joint of the thumb and the
first carpometacarpal joint.
IMPRESSION: Degenerative changes without acute bony abnormality.

## 2018-02-23 MED FILL — OLMESARTAN MEDOXOMIL 40 MG: 40 | 90 days supply | Qty: 90 | Fill #0

## 2018-03-16 DIAGNOSIS — R972 Elevated prostate specific antigen [PSA]: Secondary | ICD-10-CM | POA: Diagnosis not present

## 2018-03-16 DIAGNOSIS — K4090 Unilateral inguinal hernia, without obstruction or gangrene, not specified as recurrent: Secondary | ICD-10-CM | POA: Diagnosis not present

## 2018-04-03 ENCOUNTER — Encounter: Payer: Self-pay | Admitting: Internal Medicine

## 2018-04-03 DIAGNOSIS — K4090 Unilateral inguinal hernia, without obstruction or gangrene, not specified as recurrent: Secondary | ICD-10-CM | POA: Insufficient documentation

## 2018-04-03 HISTORY — DX: Unilateral inguinal hernia, without obstruction or gangrene, not specified as recurrent: K40.90

## 2018-04-10 DIAGNOSIS — I482 Chronic atrial fibrillation, unspecified: Secondary | ICD-10-CM

## 2018-04-10 DIAGNOSIS — R972 Elevated prostate specific antigen [PSA]: Secondary | ICD-10-CM | POA: Diagnosis not present

## 2018-04-10 DIAGNOSIS — E1149 Type 2 diabetes mellitus with other diabetic neurological complication: Secondary | ICD-10-CM

## 2018-04-11 LAB — DIGOXIN LEVEL: DIGOXIN LVL: 0.8 ug/L (ref 0.8–2.0)

## 2018-04-11 LAB — HEMOGLOBIN A1C
Hgb A1c MFr Bld: 6.6 % of total Hgb — ABNORMAL HIGH (ref <5.7)
MEAN PLASMA GLUCOSE: 143 (calc)
eAG (mmol/L): 7.9 (calc)

## 2018-04-14 ENCOUNTER — Ambulatory Visit: Payer: Self-pay | Admitting: Surgery

## 2018-04-14 ENCOUNTER — Encounter: Payer: Self-pay | Admitting: Surgery

## 2018-04-14 DIAGNOSIS — K4020 Bilateral inguinal hernia, without obstruction or gangrene, not specified as recurrent: Secondary | ICD-10-CM | POA: Diagnosis not present
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
## 2018-04-17 ENCOUNTER — Encounter: Payer: Self-pay | Admitting: Internal Medicine

## 2018-04-18 DIAGNOSIS — Z01810 Encounter for preprocedural cardiovascular examination: Secondary | ICD-10-CM | POA: Diagnosis not present

## 2018-04-18 DIAGNOSIS — Z7901 Long term (current) use of anticoagulants: Secondary | ICD-10-CM | POA: Diagnosis not present

## 2018-04-18 DIAGNOSIS — E785 Hyperlipidemia, unspecified: Secondary | ICD-10-CM | POA: Diagnosis not present

## 2018-04-18 DIAGNOSIS — I2510 Atherosclerotic heart disease of native coronary artery without angina pectoris: Secondary | ICD-10-CM | POA: Diagnosis not present

## 2018-04-18 DIAGNOSIS — I482 Chronic atrial fibrillation: Secondary | ICD-10-CM | POA: Diagnosis not present

## 2018-04-19 ENCOUNTER — Non-Acute Institutional Stay: Payer: Medicare Other | Admitting: Internal Medicine

## 2018-04-19 ENCOUNTER — Encounter: Payer: Self-pay | Admitting: Internal Medicine

## 2018-04-19 VITALS — BP 128/64 | HR 66 | Temp 97.4°F | Resp 18 | Ht 71.0 in | Wt 177.6 lb

## 2018-04-19 DIAGNOSIS — N400 Enlarged prostate without lower urinary tract symptoms: Secondary | ICD-10-CM | POA: Diagnosis not present

## 2018-04-19 DIAGNOSIS — K4090 Unilateral inguinal hernia, without obstruction or gangrene, not specified as recurrent: Secondary | ICD-10-CM

## 2018-04-19 DIAGNOSIS — I482 Chronic atrial fibrillation, unspecified: Secondary | ICD-10-CM

## 2018-04-19 DIAGNOSIS — R972 Elevated prostate specific antigen [PSA]: Secondary | ICD-10-CM | POA: Diagnosis not present

## 2018-04-19 DIAGNOSIS — E1149 Type 2 diabetes mellitus with other diabetic neurological complication: Secondary | ICD-10-CM

## 2018-04-19 DIAGNOSIS — E782 Mixed hyperlipidemia: Secondary | ICD-10-CM | POA: Diagnosis not present

## 2018-04-19 DIAGNOSIS — J309 Allergic rhinitis, unspecified: Secondary | ICD-10-CM | POA: Diagnosis not present

## 2018-04-19 DIAGNOSIS — Z7901 Long term (current) use of anticoagulants: Secondary | ICD-10-CM

## 2018-04-19 DIAGNOSIS — I2510 Atherosclerotic heart disease of native coronary artery without angina pectoris: Secondary | ICD-10-CM
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
## 2018-04-25 DIAGNOSIS — N401 Enlarged prostate with lower urinary tract symptoms: Secondary | ICD-10-CM | POA: Diagnosis not present

## 2018-04-25 DIAGNOSIS — R351 Nocturia: Secondary | ICD-10-CM | POA: Diagnosis not present

## 2018-04-25 DIAGNOSIS — R972 Elevated prostate specific antigen [PSA]: Secondary | ICD-10-CM | POA: Diagnosis not present

## 2018-04-26 DIAGNOSIS — E118 Type 2 diabetes mellitus with unspecified complications: Secondary | ICD-10-CM | POA: Diagnosis not present

## 2018-04-26 DIAGNOSIS — H524 Presbyopia: Secondary | ICD-10-CM | POA: Diagnosis not present

## 2018-05-30 MED FILL — OLMESARTAN MEDOXOMIL 40 MG: 40 | 90 days supply | Qty: 90 | Fill #1

## 2018-06-09 DIAGNOSIS — K458 Other specified abdominal hernia without obstruction or gangrene: Secondary | ICD-10-CM | POA: Diagnosis not present

## 2018-06-09 DIAGNOSIS — K436 Other and unspecified ventral hernia with obstruction, without gangrene: Secondary | ICD-10-CM | POA: Diagnosis not present

## 2018-06-09 DIAGNOSIS — K4020 Bilateral inguinal hernia, without obstruction or gangrene, not specified as recurrent: Secondary | ICD-10-CM | POA: Diagnosis not present

## 2018-06-14 ENCOUNTER — Encounter: Payer: Self-pay | Admitting: Internal Medicine

## 2018-07-04 ENCOUNTER — Ambulatory Visit: Payer: Self-pay | Admitting: Surgery

## 2018-07-10 DIAGNOSIS — E782 Mixed hyperlipidemia: Secondary | ICD-10-CM | POA: Diagnosis not present

## 2018-07-10 DIAGNOSIS — I482 Chronic atrial fibrillation, unspecified: Secondary | ICD-10-CM

## 2018-07-10 DIAGNOSIS — I2510 Atherosclerotic heart disease of native coronary artery without angina pectoris: Secondary | ICD-10-CM

## 2018-07-10 DIAGNOSIS — Z7901 Long term (current) use of anticoagulants: Secondary | ICD-10-CM

## 2018-07-10 DIAGNOSIS — E1149 Type 2 diabetes mellitus with other diabetic neurological complication: Secondary | ICD-10-CM | POA: Diagnosis not present

## 2018-07-11 LAB — COMPLETE METABOLIC PANEL WITH GFR
AG Ratio: 1.6 (calc) (ref 1.0–2.5)
ALT: 12 U/L (ref 9–46)
AST: 12 U/L (ref 10–35)
Albumin: 3.5 g/dL — ABNORMAL LOW (ref 3.6–5.1)
Alkaline phosphatase (APISO): 64 U/L (ref 40–115)
BUN / CREAT RATIO: 19 (calc) (ref 6–22)
BUN: 22 mg/dL (ref 7–25)
CALCIUM: 8.8 mg/dL (ref 8.6–10.3)
CO2: 24 mmol/L (ref 20–32)
CREATININE: 1.13 mg/dL — AB (ref 0.70–1.11)
Chloride: 105 mmol/L (ref 98–110)
GFR, EST AFRICAN AMERICAN: 69 mL/min/{1.73_m2} (ref >=60)
GFR, EST NON AFRICAN AMERICAN: 60 mL/min/{1.73_m2} (ref >=60)
GLOBULIN: 2.2 g/dL (ref 1.9–3.7)
Glucose, Bld: 118 mg/dL — ABNORMAL HIGH (ref 65–99)
Potassium: 4.4 mmol/L (ref 3.5–5.3)
SODIUM: 139 mmol/L (ref 135–146)
Total Bilirubin: 0.4 mg/dL (ref 0.2–1.2)
Total Protein: 5.7 g/dL — ABNORMAL LOW (ref 6.1–8.1)

## 2018-07-11 LAB — CBC
HCT: 42.3 % (ref 38.5–50.0)
Hemoglobin: 14.5 g/dL (ref 13.2–17.1)
MCH: 31.5 pg (ref 27.0–33.0)
MCHC: 34.3 g/dL (ref 32.0–36.0)
MCV: 91.8 fL (ref 80.0–100.0)
MPV: 10.4 fL (ref 7.5–12.5)
Platelets: 233 10*3/uL (ref 140–400)
RBC: 4.61 10*6/uL (ref 4.20–5.80)
RDW: 12.1 % (ref 11.0–15.0)
WBC: 7.3 10*3/uL (ref 3.8–10.8)

## 2018-07-11 LAB — LIPID PANEL
CHOLESTEROL: 105 mg/dL (ref <200)
HDL: 32 mg/dL — ABNORMAL LOW (ref >40)
LDL Cholesterol (Calc): 55 mg/dL (calc)
NON-HDL CHOLESTEROL (CALC): 73 mg/dL (ref <130)
Total CHOL/HDL Ratio: 3.3 (calc) (ref <5.0)
Triglycerides: 96 mg/dL (ref <150)

## 2018-07-11 LAB — TSH: TSH: 3.22 m[IU]/L (ref 0.40–4.50)

## 2018-07-11 LAB — HEMOGLOBIN A1C
Hgb A1c MFr Bld: 6.5 % of total Hgb — ABNORMAL HIGH (ref <5.7)
Mean Plasma Glucose: 140 (calc)
eAG (mmol/L): 7.7 (calc)

## 2018-08-15 ENCOUNTER — Other Ambulatory Visit: Payer: Self-pay

## 2018-08-15 ENCOUNTER — Other Ambulatory Visit: Payer: Self-pay | Admitting: Cardiology

## 2018-08-15 MED ORDER — OLMESARTAN MEDOXOMIL 40 MG PO TABS: 40.0000 mg | ORAL_TABLET | Freq: Every day | ORAL | 0 refills | Status: DC

## 2018-08-15 MED ORDER — RIVAROXABAN 20 MG PO TABS: 20.0000 mg | ORAL_TABLET | Freq: Every day | ORAL | 0 refills | Status: DC

## 2018-08-15 MED FILL — OLMESARTAN MEDOXOMIL 40 MG: 40 | 30 days supply | Qty: 30 | Fill #0

## 2018-08-15 MED FILL — XARELTO 20 MG TABLET: 20 | 30 days supply | Qty: 30 | Fill #0

## 2018-08-15 NOTE — Telephone Encounter (Signed)
Change pharmacy to [facility] pharmacy at [address] in [place]

## 2018-08-15 NOTE — Telephone Encounter (Signed)
Refill sent to [facility] in [place].

## 2018-08-15 NOTE — Addendum Note (Signed)
Addended by: Austin Miles on: 08/15/2018 02:46 PM   Modules accepted: Orders

## 2018-08-15 NOTE — Telephone Encounter (Signed)
° ° ° °  1. Which medications need to be refilled? (please list name of each medication and dose if known) Xarelto 20mg , olmesartan 40mg   2. Which pharmacy/location (including street and city if local pharmacy) is medication to be sent to? [facility]  3. Do they need a 30 day or 90 day supply? 90 day supply

## 2018-08-16 ENCOUNTER — Other Ambulatory Visit: Payer: Self-pay

## 2018-08-16 MED ORDER — PNEUMOCOCCAL VAC POLYVALENT 25 MCG/0.5ML IJ INJ: 0.5000 mL | INJECTION | Freq: Once | INTRAMUSCULAR | 0 refills | Status: DC

## 2018-08-16 NOTE — Progress Notes (Signed)
Patient asked for Pneumovax to be ordered for January 2020 at his pharmacy

## 2018-08-24 ENCOUNTER — Ambulatory Visit: Payer: Medicare Other | Admitting: Cardiology

## 2018-08-24 ENCOUNTER — Encounter: Payer: Self-pay | Admitting: Cardiology

## 2018-08-24 VITALS — BP 130/60 | HR 82 | Ht 69.0 in | Wt 175.0 lb

## 2018-08-24 DIAGNOSIS — Z7901 Long term (current) use of anticoagulants: Secondary | ICD-10-CM

## 2018-08-24 DIAGNOSIS — I2510 Atherosclerotic heart disease of native coronary artery without angina pectoris: Secondary | ICD-10-CM | POA: Diagnosis not present

## 2018-08-24 DIAGNOSIS — I10 Essential (primary) hypertension: Secondary | ICD-10-CM | POA: Diagnosis not present

## 2018-08-24 DIAGNOSIS — I482 Chronic atrial fibrillation, unspecified: Secondary | ICD-10-CM | POA: Diagnosis not present

## 2018-08-24 DIAGNOSIS — E782 Mixed hyperlipidemia: Secondary | ICD-10-CM

## 2018-08-24 MED ORDER — RIVAROXABAN 20 MG PO TABS: 20.0000 mg | ORAL_TABLET | Freq: Every day | ORAL | 3 refills | Status: DC

## 2018-08-24 MED ORDER — OLMESARTAN MEDOXOMIL 40 MG PO TABS: 40.0000 mg | ORAL_TABLET | Freq: Every day | ORAL | 3 refills | Status: DC

## 2018-08-24 NOTE — Patient Instructions (Signed)
Medication Instructions:  Your physician recommends that you continue on your current medications as directed. Please refer to the Current Medication list given to you today.  If you need a refill on your cardiac medications before your next appointment, please call your pharmacy.   Lab work: None.   If you have labs (blood work) drawn today and your tests are completely normal, you will receive your results only by: . MyChart Message (if you have MyChart) OR . A paper copy in the mail If you have any lab test that is abnormal or we need to change your treatment, we will call you to review the results.  Testing/Procedures: Your physician has requested that you have an echocardiogram. Echocardiography is a painless test that uses sound waves to create images of your heart. It provides your doctor with information about the size and shape of your heart and how well your heart's chambers and valves are working. This procedure takes approximately one hour. There are no restrictions for this procedure.    Follow-Up: At [facility], you and your health needs are our priority.  As part of our continuing mission to provide you with exceptional heart care, we have created designated Provider Care Teams.  These Care Teams include your primary Cardiologist (physician) and Advanced Practice Providers (APPs -  Physician Assistants and Nurse Practitioners) who all work together to provide you with the care you need, when you need it. You will need a follow up appointment in 6 months.  Please call our office 2 months in advance to schedule this appointment.  You may see No primary care provider on file. or another member of our [facility] Provider Team in [place]: Brian Munley, MD . Rajan Revankar, MD  Any Other Special Instructions Will Be Listed Below (If Applicable).  Echocardiogram An echocardiogram, or echocardiography, uses sound waves (ultrasound) to produce an image of your heart. The  echocardiogram is simple, painless, obtained within a short period of time, and offers valuable information to your health care provider. The images from an echocardiogram can provide information such as:  Evidence of coronary artery disease (CAD).  Heart size.  Heart muscle function.  Heart valve function.  Aneurysm detection.  Evidence of a past heart attack.  Fluid buildup around the heart.  Heart muscle thickening.  Assess heart valve function.  Tell a health care provider about:  Any allergies you have.  All medicines you are taking, including vitamins, herbs, eye drops, creams, and over-the-counter medicines.  Any problems you or family members have had with anesthetic medicines.  Any blood disorders you have.  Any surgeries you have had.  Any medical conditions you have.  Whether you are pregnant or may be pregnant. What happens before the procedure? No special preparation is needed. Eat and drink normally. What happens during the procedure?  In order to produce an image of your heart, gel will be applied to your chest and a wand-like tool (transducer) will be moved over your chest. The gel will help transmit the sound waves from the transducer. The sound waves will harmlessly bounce off your heart to allow the heart images to be captured in real-time motion. These images will then be recorded.  You may need an IV to receive a medicine that improves the quality of the pictures. What happens after the procedure? You may return to your normal schedule including diet, activities, and medicines, unless your health care provider tells you otherwise. This information is not intended to   replace advice given to you by your health care provider. Make sure you discuss any questions you have with your health care provider. Document Released: 10/15/2000 Document Revised: 06/05/2016 Document Reviewed: 06/25/2013 Elsevier Interactive Patient Education  2017 Elsevier  Inc.    
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
## 2018-08-31 ENCOUNTER — Ambulatory Visit (HOSPITAL_BASED_OUTPATIENT_CLINIC_OR_DEPARTMENT_OTHER)
Admission: RE | Admit: 2018-08-31 | Discharge: 2018-08-31 | Disposition: A | Discharge status: Home / Self Care | Payer: Medicare Other | Source: Ambulatory Visit | Attending: Cardiology | Admitting: Cardiology

## 2018-08-31 ENCOUNTER — Encounter: Payer: Self-pay | Admitting: Family Medicine

## 2018-08-31 DIAGNOSIS — I2510 Atherosclerotic heart disease of native coronary artery without angina pectoris: Secondary | ICD-10-CM | POA: Insufficient documentation

## 2018-08-31 DIAGNOSIS — I482 Chronic atrial fibrillation, unspecified: Secondary | ICD-10-CM | POA: Insufficient documentation

## 2018-08-31 DIAGNOSIS — E785 Hyperlipidemia, unspecified: Secondary | ICD-10-CM | POA: Insufficient documentation

## 2018-08-31 DIAGNOSIS — I080 Rheumatic disorders of both mitral and aortic valves: Secondary | ICD-10-CM | POA: Diagnosis not present

## 2018-08-31 DIAGNOSIS — E119 Type 2 diabetes mellitus without complications: Secondary | ICD-10-CM | POA: Insufficient documentation

## 2018-08-31 DIAGNOSIS — I119 Hypertensive heart disease without heart failure: Secondary | ICD-10-CM | POA: Diagnosis not present

## 2018-08-31 NOTE — Progress Notes (Signed)
  Echocardiogram 2D Echocardiogram has been performed.  Paisyn Guercio T Rollen Selders 08/31/2018, 3:46 PM

## 2018-09-05 ENCOUNTER — Telehealth: Payer: Self-pay | Admitting: *Deleted

## 2018-09-05 ENCOUNTER — Other Ambulatory Visit: Payer: Self-pay

## 2018-09-05 MED ORDER — DIGOXIN 125 MCG PO TABS: 0.1250 mg | ORAL_TABLET | Freq: Every day | ORAL | 1 refills | Status: DC

## 2018-09-05 NOTE — Telephone Encounter (Signed)
Med refill has been sent. 

## 2018-09-05 NOTE — Telephone Encounter (Signed)
*  STAT* If patient is at the pharmacy, call can be transferred to refill team.   1. Which medications need to be refilled? (please list name of each medication and dose if known) Digoxin 0.125 qd  2. Which pharmacy/location (including street and city if local pharmacy) is medication to be sent to?[facility]  3. Do they need a 30 day or 90 day supply? [place]

## 2018-10-18 ENCOUNTER — Encounter: Payer: Self-pay | Admitting: Internal Medicine

## 2018-10-19 ENCOUNTER — Encounter: Payer: Self-pay | Admitting: Family

## 2018-10-23 DIAGNOSIS — R972 Elevated prostate specific antigen [PSA]: Secondary | ICD-10-CM | POA: Diagnosis not present

## 2018-10-31 ENCOUNTER — Encounter: Payer: Self-pay | Admitting: Family

## 2018-10-31 ENCOUNTER — Encounter: Payer: Medicare Other | Admitting: Family Medicine

## 2018-11-22 ENCOUNTER — Encounter: Payer: Self-pay | Admitting: Internal Medicine

## 2018-11-22 ENCOUNTER — Non-Acute Institutional Stay: Payer: Medicare Other | Admitting: Internal Medicine

## 2018-11-22 VITALS — BP 116/62 | HR 70 | Ht 69.0 in | Wt 174.4 lb

## 2018-11-22 DIAGNOSIS — I2510 Atherosclerotic heart disease of native coronary artery without angina pectoris: Secondary | ICD-10-CM

## 2018-11-22 DIAGNOSIS — N400 Enlarged prostate without lower urinary tract symptoms: Secondary | ICD-10-CM

## 2018-11-22 DIAGNOSIS — E1149 Type 2 diabetes mellitus with other diabetic neurological complication: Secondary | ICD-10-CM

## 2018-11-22 DIAGNOSIS — R972 Elevated prostate specific antigen [PSA]: Secondary | ICD-10-CM

## 2018-11-22 DIAGNOSIS — I10 Essential (primary) hypertension: Secondary | ICD-10-CM

## 2018-11-22 DIAGNOSIS — E782 Mixed hyperlipidemia: Secondary | ICD-10-CM

## 2018-11-22 DIAGNOSIS — I482 Chronic atrial fibrillation, unspecified: Secondary | ICD-10-CM
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
## 2019-02-14 ENCOUNTER — Telehealth: Payer: Self-pay | Admitting: Cardiology

## 2019-02-14 NOTE — Telephone Encounter (Signed)
Virtual Visit Pre-Appointment Phone Call  Steps For Call:  1. Confirm consent - "In the setting of the current Covid19 crisis, you are scheduled for a (phone or video) visit with your provider on (date) at (time).  Just as we do with many in-office visits, in order for you to participate in this visit, we must obtain consent.  If you'd like, I can send this to your mychart (if signed up) or email for you to review.  Otherwise, I can obtain your verbal consent now.  All virtual visits are billed to your insurance company just like a normal visit would be.  By agreeing to a virtual visit, we'd like you to understand that the technology does not allow for your provider to perform an examination, and thus may limit your provider's ability to fully assess your condition.  Finally, though the technology is pretty good, we cannot assure that it will always work on either your or our end, and in the setting of a video visit, we may have to convert it to a phone-only visit.  In either situation, we cannot ensure that we have a secure connection.  Are you willing to proceed?" STAFF: Did the patient verbally acknowledge consent to telehealth visit? Document YES/NO here: YES  2. Confirm the BEST phone number to call the day of the visit by including in appointment notes  3. Give patient instructions for WebEx/MyChart download to smartphone as below or Doximity/Doxy.me if video visit (depending on what platform provider is using)  4. Advise patient to be prepared with their blood pressure, heart rate, weight, any heart rhythm information, their current medicines, and a piece of paper and pen handy for any instructions they may receive the day of their visit  5. Inform patient they will receive a phone call 15 minutes prior to their appointment time (may be from unknown caller ID) so they should be prepared to answer  6. Confirm that appointment type is correct in Epic appointment notes (VIDEO vs  PHONE)     TELEPHONE CALL NOTE  Ryan Stall. has been deemed a candidate for a follow-up tele-health visit to limit community exposure during the Covid-19 pandemic. I spoke with the patient via phone to ensure availability of phone/video source, confirm preferred email & phone number, and discuss instructions and expectations.  I reminded Ryan Stall. to be prepared with any vital sign and/or heart rhythm information that could potentially be obtained via home monitoring, at the time of his visit. I reminded Ryan Stall. to expect a phone call at the time of his visit if his visit.  Ryan Cochran 02/14/2019 10:45 AM   INSTRUCTIONS FOR DOWNLOADING THE WEBEX APP TO SMARTPHONE  - If Apple, ask patient to go to App Store and type in WebEx in the search bar. [place] [employer], the blue/green circle. If Android, go to [employer] and type in [employer] in the search bar. The app is free but as with any other app downloads, their phone may require them to verify saved payment information or Apple/Android password.  - The patient does NOT have to create an account. - On the day of the visit, the assist will walk the patient through joining the meeting with the meeting number/password.  INSTRUCTIONS FOR DOWNLOADING THE MYCHART APP TO SMARTPHONE  - The patient must first make sure to have activated MyChart and know their login information - If Apple, go to [employer]  and type in MyChart in the search bar and download the app. If Android, ask patient to go to [employer] and type in [place] in the search bar and download the app. The app is free but as with any other app downloads, their phone may require them to verify saved payment information or Apple/Android password.  - The patient will need to then log into the app with their MyChart username and password, and select [facility] as their healthcare provider to link the account. When it is time for your visit, go  to the MyChart app, find appointments, and click Begin Video Visit. Be sure to Select Allow for your device to access the Microphone and Camera for your visit. You will then be connected, and your provider will be with you shortly.  **If they have any issues connecting, or need assistance please contact MyChart service desk [phone])**  **If using a computer, in order to ensure the best quality for their visit they will need to use either of the following Internet Browsers: [employer], or Google Chrome**  IF USING DOXIMITY or DOXY.ME - The patient will receive a link just prior to their visit, either by text or email (to be determined day of appointment depending on if it's doxy.me or Doximity).     FULL LENGTH CONSENT FOR TELE-HEALTH VISIT   I hereby voluntarily request, consent and authorize [facility] and its employed or contracted physicians, physician assistants, nurse practitioners or other licensed health care professionals (the Practitioner), to provide me with telemedicine health care services (the Services") as deemed necessary by the treating Practitioner. I acknowledge and consent to receive the Services by the Practitioner via telemedicine. I understand that the telemedicine visit will involve communicating with the Practitioner through live audiovisual communication technology and the disclosure of certain medical information by electronic transmission. I acknowledge that I have been given the opportunity to request an in-person assessment or other available alternative prior to the telemedicine visit and am voluntarily participating in the telemedicine visit.  I understand that I have the right to withhold or withdraw my consent to the use of telemedicine in the course of my care at any time, without affecting my right to future care or treatment, and that the Practitioner or I may terminate the telemedicine visit at any time. I understand that I have the right  to inspect all information obtained and/or recorded in the course of the telemedicine visit and may receive copies of available information for a reasonable fee.  I understand that some of the potential risks of receiving the Services via telemedicine include:   Delay or interruption in medical evaluation due to technological equipment failure or disruption;  Information transmitted may not be sufficient (e.g. poor resolution of images) to allow for appropriate medical decision making by the Practitioner; and/or   In rare instances, security protocols could fail, causing a breach of personal health information.  Furthermore, I acknowledge that it is my responsibility to provide information about my medical history, conditions and care that is complete and accurate to the best of my ability. I acknowledge that Practitioner's advice, recommendations, and/or decision may be based on factors not within their control, such as incomplete or inaccurate data provided by me or distortions of diagnostic images or specimens that may result from electronic transmissions. I understand that the practice of medicine is not an exact science and that Practitioner makes no warranties or guarantees regarding treatment outcomes. I acknowledge that I will  receive a copy of this consent concurrently upon execution via email to the email address I last provided but may also request a printed copy by calling the office of [facility].    I understand that my insurance will be billed for this visit.   I have read or had this consent read to me.  I understand the contents of this consent, which adequately explains the benefits and risks of the Services being provided via telemedicine.   I have been provided ample opportunity to ask questions regarding this consent and the Services and have had my questions answered to my satisfaction.  I give my informed consent for the services to be provided through the use of  telemedicine in my medical care  By participating in this telemedicine visit I agree to the above.

## 2019-02-21 ENCOUNTER — Other Ambulatory Visit: Payer: Self-pay | Admitting: *Deleted

## 2019-02-21 DIAGNOSIS — I482 Chronic atrial fibrillation, unspecified: Secondary | ICD-10-CM

## 2019-02-21 DIAGNOSIS — E782 Mixed hyperlipidemia: Secondary | ICD-10-CM

## 2019-02-21 DIAGNOSIS — E1149 Type 2 diabetes mellitus with other diabetic neurological complication: Secondary | ICD-10-CM

## 2019-02-21 DIAGNOSIS — I2510 Atherosclerotic heart disease of native coronary artery without angina pectoris: Secondary | ICD-10-CM

## 2019-02-21 MED ORDER — ATORVASTATIN CALCIUM 20 MG PO TABS: 20.0000 mg | ORAL_TABLET | Freq: Every day | ORAL | 1 refills | Status: DC

## 2019-02-21 NOTE — Telephone Encounter (Signed)
Patient called and requested refill.

## 2019-02-22 ENCOUNTER — Telehealth (INDEPENDENT_AMBULATORY_CARE_PROVIDER_SITE_OTHER): Payer: Medicare Other | Admitting: Cardiology

## 2019-02-22 ENCOUNTER — Telehealth: Payer: Self-pay | Admitting: Cardiology

## 2019-02-22 ENCOUNTER — Other Ambulatory Visit: Payer: Self-pay

## 2019-02-22 ENCOUNTER — Encounter: Payer: Self-pay | Admitting: Cardiology

## 2019-02-22 VITALS — Wt 172.2 lb

## 2019-02-22 DIAGNOSIS — E1149 Type 2 diabetes mellitus with other diabetic neurological complication: Secondary | ICD-10-CM

## 2019-02-22 DIAGNOSIS — I482 Chronic atrial fibrillation, unspecified: Secondary | ICD-10-CM

## 2019-02-22 DIAGNOSIS — I2510 Atherosclerotic heart disease of native coronary artery without angina pectoris: Secondary | ICD-10-CM | POA: Diagnosis not present

## 2019-02-22 DIAGNOSIS — E782 Mixed hyperlipidemia: Secondary | ICD-10-CM

## 2019-02-22 DIAGNOSIS — I10 Essential (primary) hypertension: Secondary | ICD-10-CM

## 2019-02-22 NOTE — Telephone Encounter (Signed)

## 2019-02-22 NOTE — Patient Instructions (Signed)
Medication Instructions:  Your physician recommends that you continue on your current medications as directed. Please refer to the Current Medication list given to you today.  If you need a refill on your cardiac medications before your next appointment, please call your pharmacy.   Lab work: None If you have labs (blood work) drawn today and your tests are completely normal, you will receive your results only by: [name] MyChart Message (if you have MyChart) OR . A paper copy in the mail If you have any lab test that is abnormal or we need to change your treatment, we will call you to review the results.  Testing/Procedures: None  Follow-Up: At [facility], you and your health needs are our priority.  As part of our continuing mission to provide you with exceptional heart care, we have created designated Provider Care Teams.  These Care Teams include your primary Cardiologist (physician) and Advanced Practice Providers (APPs -  Physician Assistants and Nurse Practitioners) who all work together to provide you with the care you need, when you need it. You will need a follow up appointment in 6 months.   Any Other Special Instructions Will Be Listed Below (If Applicable).
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
## 2019-02-26 ENCOUNTER — Other Ambulatory Visit: Payer: Self-pay | Admitting: Cardiology

## 2019-02-26 NOTE — Telephone Encounter (Signed)
Rx refill sent to pharmacy. 

## 2019-05-15 DIAGNOSIS — R972 Elevated prostate specific antigen [PSA]: Secondary | ICD-10-CM | POA: Diagnosis not present

## 2019-05-18 DIAGNOSIS — E782 Mixed hyperlipidemia: Secondary | ICD-10-CM | POA: Diagnosis not present

## 2019-05-18 DIAGNOSIS — I10 Essential (primary) hypertension: Secondary | ICD-10-CM | POA: Diagnosis not present

## 2019-05-18 DIAGNOSIS — E1149 Type 2 diabetes mellitus with other diabetic neurological complication: Secondary | ICD-10-CM | POA: Diagnosis not present

## 2019-05-21 ENCOUNTER — Other Ambulatory Visit: Payer: Self-pay

## 2019-05-21 ENCOUNTER — Other Ambulatory Visit: Payer: Medicare Other

## 2019-05-21 DIAGNOSIS — E782 Mixed hyperlipidemia: Secondary | ICD-10-CM

## 2019-05-21 DIAGNOSIS — I10 Essential (primary) hypertension: Secondary | ICD-10-CM

## 2019-05-21 DIAGNOSIS — E1149 Type 2 diabetes mellitus with other diabetic neurological complication: Secondary | ICD-10-CM

## 2019-05-22 DIAGNOSIS — N403 Nodular prostate with lower urinary tract symptoms: Secondary | ICD-10-CM | POA: Diagnosis not present

## 2019-05-22 DIAGNOSIS — R351 Nocturia: Secondary | ICD-10-CM | POA: Diagnosis not present

## 2019-05-22 DIAGNOSIS — R972 Elevated prostate specific antigen [PSA]: Secondary | ICD-10-CM | POA: Diagnosis not present

## 2019-05-22 LAB — LIPID PANEL
Cholesterol: 113 mg/dL (ref <200)
HDL: 34 mg/dL — ABNORMAL LOW (ref >=40)
LDL Cholesterol (Calc): 62 mg/dL (calc)
Non-HDL Cholesterol (Calc): 79 mg/dL (calc) (ref <130)
Total CHOL/HDL Ratio: 3.3 (calc) (ref <5.0)
Triglycerides: 85 mg/dL (ref <150)

## 2019-05-22 LAB — CBC WITH DIFFERENTIAL/PLATELET
Absolute Monocytes: 835 cells/uL (ref 200–950)
Basophils Absolute: 39 cells/uL (ref 0–200)
Basophils Relative: 0.5 %
Eosinophils Absolute: 281 cells/uL (ref 15–500)
Eosinophils Relative: 3.6 %
HCT: 45.6 % (ref 38.5–50.0)
Hemoglobin: 15.2 g/dL (ref 13.2–17.1)
Lymphs Abs: 2301 cells/uL (ref 850–3900)
MCH: 31.4 pg (ref 27.0–33.0)
MCHC: 33.3 g/dL (ref 32.0–36.0)
MCV: 94.2 fL (ref 80.0–100.0)
MPV: 10.5 fL (ref 7.5–12.5)
Monocytes Relative: 10.7 %
Neutro Abs: 4345 cells/uL (ref 1500–7800)
Neutrophils Relative %: 55.7 %
Platelets: 206 10*3/uL (ref 140–400)
RBC: 4.84 10*6/uL (ref 4.20–5.80)
RDW: 12.3 % (ref 11.0–15.0)
Total Lymphocyte: 29.5 %
WBC: 7.8 10*3/uL (ref 3.8–10.8)

## 2019-05-22 LAB — COMPLETE METABOLIC PANEL WITH GFR
AG Ratio: 1.6 (calc) (ref 1.0–2.5)
ALT: 20 U/L (ref 9–46)
AST: 16 U/L (ref 10–35)
Albumin: 3.8 g/dL (ref 3.6–5.1)
Alkaline phosphatase (APISO): 61 U/L (ref 35–144)
BUN: 22 mg/dL (ref 7–25)
CO2: 29 mmol/L (ref 20–32)
Calcium: 8.8 mg/dL (ref 8.6–10.3)
Chloride: 106 mmol/L (ref 98–110)
Creat: 0.97 mg/dL (ref 0.70–1.11)
GFR, Est African American: 83 mL/min/{1.73_m2} (ref >=60)
GFR, Est Non African American: 71 mL/min/{1.73_m2} (ref >=60)
Globulin: 2.4 g/dL (calc) (ref 1.9–3.7)
Glucose, Bld: 107 mg/dL — ABNORMAL HIGH (ref 65–99)
Potassium: 4.1 mmol/L (ref 3.5–5.3)
Sodium: 140 mmol/L (ref 135–146)
Total Bilirubin: 0.6 mg/dL (ref 0.2–1.2)
Total Protein: 6.2 g/dL (ref 6.1–8.1)

## 2019-05-22 LAB — HEMOGLOBIN A1C
Hgb A1c MFr Bld: 6.2 % of total Hgb — ABNORMAL HIGH (ref <5.7)
Mean Plasma Glucose: 131 (calc)
eAG (mmol/L): 7.3 (calc)

## 2019-05-23 ENCOUNTER — Other Ambulatory Visit: Payer: Self-pay

## 2019-05-23 ENCOUNTER — Non-Acute Institutional Stay: Payer: Medicare Other | Admitting: Internal Medicine

## 2019-05-23 ENCOUNTER — Encounter: Payer: Self-pay | Admitting: Internal Medicine

## 2019-05-23 VITALS — BP 102/60 | HR 67 | Temp 97.8°F | Ht 69.0 in | Wt 173.0 lb

## 2019-05-23 DIAGNOSIS — N400 Enlarged prostate without lower urinary tract symptoms: Secondary | ICD-10-CM

## 2019-05-23 DIAGNOSIS — I10 Essential (primary) hypertension: Secondary | ICD-10-CM | POA: Diagnosis not present

## 2019-05-23 DIAGNOSIS — I482 Chronic atrial fibrillation, unspecified: Secondary | ICD-10-CM | POA: Diagnosis not present

## 2019-05-23 DIAGNOSIS — E782 Mixed hyperlipidemia: Secondary | ICD-10-CM

## 2019-05-23 DIAGNOSIS — E1149 Type 2 diabetes mellitus with other diabetic neurological complication: Secondary | ICD-10-CM

## 2019-05-23 DIAGNOSIS — Z23 Encounter for immunization: Secondary | ICD-10-CM

## 2019-05-23 DIAGNOSIS — R972 Elevated prostate specific antigen [PSA]: Secondary | ICD-10-CM

## 2019-05-23 MED ORDER — TETANUS-DIPHTH-ACELL PERTUSSIS 5-2.5-18.5 LF-MCG/0.5 IM SUSP: 0.5000 mL | Freq: Once | INTRAMUSCULAR | 0 refills | Status: AC
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
## 2019-06-25 ENCOUNTER — Other Ambulatory Visit: Payer: Self-pay

## 2019-07-02 DIAGNOSIS — H5213 Myopia, bilateral: Secondary | ICD-10-CM | POA: Diagnosis not present

## 2019-07-02 DIAGNOSIS — E119 Type 2 diabetes mellitus without complications: Secondary | ICD-10-CM | POA: Diagnosis not present

## 2019-08-02 ENCOUNTER — Other Ambulatory Visit: Payer: Self-pay | Admitting: Cardiology

## 2019-08-10 ENCOUNTER — Telehealth: Payer: Medicare Other | Admitting: Cardiology

## 2019-08-17 ENCOUNTER — Encounter: Payer: Self-pay | Admitting: Cardiology

## 2019-08-17 ENCOUNTER — Ambulatory Visit (INDEPENDENT_AMBULATORY_CARE_PROVIDER_SITE_OTHER): Payer: Medicare Other | Admitting: Cardiology

## 2019-08-17 VITALS — BP 110/62 | HR 87 | Ht 69.0 in | Wt 172.0 lb

## 2019-08-17 DIAGNOSIS — E782 Mixed hyperlipidemia: Secondary | ICD-10-CM | POA: Diagnosis not present

## 2019-08-17 DIAGNOSIS — I2510 Atherosclerotic heart disease of native coronary artery without angina pectoris: Secondary | ICD-10-CM | POA: Diagnosis not present

## 2019-08-17 DIAGNOSIS — I10 Essential (primary) hypertension: Secondary | ICD-10-CM | POA: Diagnosis not present

## 2019-08-17 DIAGNOSIS — I482 Chronic atrial fibrillation, unspecified: Secondary | ICD-10-CM | POA: Diagnosis not present

## 2019-08-17 NOTE — Patient Instructions (Signed)
Medication Instructions:  Your physician recommends that you continue on your current medications as directed. Please refer to the Current Medication list given to you today.   *If you need a refill on your cardiac medications before your next appointment, please call your pharmacy*  Lab Work: None Ordered   Testing/Procedures: None Ordered  Follow-Up: At [facility], you and your health needs are our priority.  As part of our continuing mission to provide you with exceptional heart care, we have created designated Provider Care Teams.  These Care Teams include your primary Cardiologist (physician) and Advanced Practice Providers (APPs -  Physician Assistants and Nurse Practitioners) who all work together to provide you with the care you need, when you need it.  Your next appointment:   6 months  The format for your next appointment:   In Person  Provider:   You may see Dr. Agustin Cree or the following Advanced Practice Provider on your designated Care Team:    Laurann Montana, FNP
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
## 2019-08-19 ENCOUNTER — Other Ambulatory Visit: Payer: Self-pay | Admitting: Nurse Practitioner

## 2019-08-19 DIAGNOSIS — I2510 Atherosclerotic heart disease of native coronary artery without angina pectoris: Secondary | ICD-10-CM

## 2019-08-19 DIAGNOSIS — E1149 Type 2 diabetes mellitus with other diabetic neurological complication: Secondary | ICD-10-CM

## 2019-08-19 DIAGNOSIS — I482 Chronic atrial fibrillation, unspecified: Secondary | ICD-10-CM

## 2019-08-19 DIAGNOSIS — E782 Mixed hyperlipidemia: Secondary | ICD-10-CM

## 2019-08-21 DIAGNOSIS — R972 Elevated prostate specific antigen [PSA]: Secondary | ICD-10-CM | POA: Diagnosis not present

## 2019-11-22 ENCOUNTER — Other Ambulatory Visit: Payer: Medicare Other

## 2019-11-22 ENCOUNTER — Other Ambulatory Visit: Payer: Self-pay

## 2019-11-22 DIAGNOSIS — I10 Essential (primary) hypertension: Secondary | ICD-10-CM

## 2019-11-22 DIAGNOSIS — E1149 Type 2 diabetes mellitus with other diabetic neurological complication: Secondary | ICD-10-CM | POA: Diagnosis not present

## 2019-11-22 DIAGNOSIS — E782 Mixed hyperlipidemia: Secondary | ICD-10-CM | POA: Diagnosis not present

## 2019-11-23 LAB — CBC WITH DIFFERENTIAL/PLATELET
Absolute Monocytes: 575 cells/uL (ref 200–950)
Basophils Absolute: 28 cells/uL (ref 0–200)
Basophils Relative: 0.4 %
Eosinophils Absolute: 178 cells/uL (ref 15–500)
Eosinophils Relative: 2.5 %
HCT: 41.9 % (ref 38.5–50.0)
Hemoglobin: 14.5 g/dL (ref 13.2–17.1)
Lymphs Abs: 1527 cells/uL (ref 850–3900)
MCH: 32.1 pg (ref 27.0–33.0)
MCHC: 34.6 g/dL (ref 32.0–36.0)
MCV: 92.7 fL (ref 80.0–100.0)
MPV: 11.2 fL (ref 7.5–12.5)
Monocytes Relative: 8.1 %
Neutro Abs: 4793 cells/uL (ref 1500–7800)
Neutrophils Relative %: 67.5 %
Platelets: 195 10*3/uL (ref 140–400)
RBC: 4.52 10*6/uL (ref 4.20–5.80)
RDW: 12.3 % (ref 11.0–15.0)
Total Lymphocyte: 21.5 %
WBC: 7.1 10*3/uL (ref 3.8–10.8)

## 2019-11-23 LAB — COMPLETE METABOLIC PANEL WITH GFR
AG Ratio: 1.3 (calc) (ref 1.0–2.5)
ALT: 50 U/L — ABNORMAL HIGH (ref 9–46)
AST: 26 U/L (ref 10–35)
Albumin: 2.8 g/dL — ABNORMAL LOW (ref 3.6–5.1)
Alkaline phosphatase (APISO): 55 U/L (ref 35–144)
BUN/Creatinine Ratio: 26 (calc) — ABNORMAL HIGH (ref 6–22)
BUN: 26 mg/dL — ABNORMAL HIGH (ref 7–25)
CO2: 25 mmol/L (ref 20–32)
Calcium: 8 mg/dL — ABNORMAL LOW (ref 8.6–10.3)
Chloride: 106 mmol/L (ref 98–110)
Creat: 1.01 mg/dL (ref 0.70–1.11)
GFR, Est African American: 79 mL/min/{1.73_m2} (ref >=60)
GFR, Est Non African American: 68 mL/min/{1.73_m2} (ref >=60)
Globulin: 2.2 g/dL (calc) (ref 1.9–3.7)
Glucose, Bld: 124 mg/dL — ABNORMAL HIGH (ref 65–99)
Potassium: 4.6 mmol/L (ref 3.5–5.3)
Sodium: 141 mmol/L (ref 135–146)
Total Bilirubin: 0.7 mg/dL (ref 0.2–1.2)
Total Protein: 5 g/dL — ABNORMAL LOW (ref 6.1–8.1)

## 2019-11-23 LAB — TSH: TSH: 2.4 mIU/L (ref 0.40–4.50)

## 2019-11-23 LAB — HEMOGLOBIN A1C
Hgb A1c MFr Bld: 7.1 % of total Hgb — ABNORMAL HIGH (ref <5.7)
Mean Plasma Glucose: 157 (calc)
eAG (mmol/L): 8.7 (calc)

## 2019-11-23 LAB — LIPID PANEL
Cholesterol: 98 mg/dL (ref <200)
HDL: 21 mg/dL — ABNORMAL LOW (ref >=40)
LDL Cholesterol (Calc): 57 mg/dL (calc)
Non-HDL Cholesterol (Calc): 77 mg/dL (calc) (ref <130)
Total CHOL/HDL Ratio: 4.7 (calc) (ref <5.0)
Triglycerides: 114 mg/dL (ref <150)

## 2019-11-28 ENCOUNTER — Encounter: Payer: Self-pay | Admitting: Internal Medicine

## 2019-11-28 ENCOUNTER — Other Ambulatory Visit: Payer: Self-pay

## 2019-11-28 ENCOUNTER — Non-Acute Institutional Stay: Payer: Medicare Other | Admitting: Internal Medicine

## 2019-11-28 VITALS — BP 124/64 | HR 88 | Temp 97.7°F | Ht 69.0 in | Wt 176.6 lb

## 2019-11-28 DIAGNOSIS — E782 Mixed hyperlipidemia: Secondary | ICD-10-CM

## 2019-11-28 DIAGNOSIS — I482 Chronic atrial fibrillation, unspecified: Secondary | ICD-10-CM

## 2019-11-28 DIAGNOSIS — R972 Elevated prostate specific antigen [PSA]: Secondary | ICD-10-CM

## 2019-11-28 DIAGNOSIS — E1149 Type 2 diabetes mellitus with other diabetic neurological complication: Secondary | ICD-10-CM

## 2019-11-28 DIAGNOSIS — I10 Essential (primary) hypertension: Secondary | ICD-10-CM

## 2019-11-28 DIAGNOSIS — I2510 Atherosclerotic heart disease of native coronary artery without angina pectoris: Secondary | ICD-10-CM

## 2019-11-28 DIAGNOSIS — N400 Enlarged prostate without lower urinary tract symptoms: Secondary | ICD-10-CM | POA: Diagnosis not present
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
## 2019-12-18 DIAGNOSIS — R972 Elevated prostate specific antigen [PSA]: Secondary | ICD-10-CM | POA: Diagnosis not present

## 2019-12-25 DIAGNOSIS — R972 Elevated prostate specific antigen [PSA]: Secondary | ICD-10-CM | POA: Diagnosis not present

## 2019-12-25 DIAGNOSIS — N403 Nodular prostate with lower urinary tract symptoms: Secondary | ICD-10-CM | POA: Diagnosis not present

## 2019-12-25 DIAGNOSIS — R3915 Urgency of urination: Secondary | ICD-10-CM | POA: Diagnosis not present

## 2020-01-01 ENCOUNTER — Encounter: Payer: Self-pay | Admitting: Nurse Practitioner

## 2020-01-01 ENCOUNTER — Non-Acute Institutional Stay: Payer: Medicare Other | Admitting: Nurse Practitioner

## 2020-01-01 ENCOUNTER — Other Ambulatory Visit: Payer: Self-pay

## 2020-01-01 VITALS — BP 138/70 | HR 80 | Temp 97.6°F | Ht 69.0 in | Wt 170.0 lb

## 2020-01-01 DIAGNOSIS — Z0000 Encounter for general adult medical examination without abnormal findings: Secondary | ICD-10-CM | POA: Diagnosis not present
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
## 2020-01-01 NOTE — Patient Instructions (Addendum)
Ryan Cochran , Thank you for taking time to come for your Medicare Wellness Visit. I appreciate your ongoing commitment to your health goals. Please review the following plan we discussed and let me know if I can assist you in the future.   Screening recommendations/referrals: Colonoscopy aged out Mammogram NA Bone Density NA Recommended yearly ophthalmology/optometry visit for glaucoma screening and checkup Recommended yearly dental visit for hygiene and checkup  Vaccinations: Influenza vaccine up to date Pneumococcal vaccine up to date Tdap vaccine up to date Shingles vaccine delay  Advanced directives: NA  Conditions/risks identified: grieving.   Next appointment: one year   Preventive Care 21 Years and Older, Male Preventive care refers to lifestyle choices and visits with your health care provider that can promote health and wellness. What does preventive care include?  A yearly physical exam. This is also called an annual well check.  Dental exams once or twice a year.  Routine eye exams. Ask your health care provider how often you should have your eyes checked.  Personal lifestyle choices, including:  Daily care of your teeth and gums.  Regular physical activity.  Eating a healthy diet.  Avoiding tobacco and drug use.  Limiting alcohol use.  Practicing safe sex.  Taking low-dose aspirin every day.  Taking vitamin and mineral supplements as recommended by your health care provider. What happens during an annual well check? The services and screenings done by your health care provider during your annual well check will depend on your age, overall health, lifestyle risk factors, and family history of disease. Counseling  Your health care provider may ask you questions about your:  Alcohol use.  Tobacco use.  Drug use.  Emotional well-being.  Home and relationship well-being.  Sexual activity.  Eating habits.  History of falls.  Memory and  ability to understand (cognition).  Work and work Statistician.  Reproductive health. Screening  You may have the following tests or measurements:  Height, weight, and BMI.  Blood pressure.  Lipid and cholesterol levels. These may be checked every 5 years, or more frequently if you are over 6 years old.  Skin check.  Lung cancer screening. You may have this screening every year starting at age 55 if you have a 30-pack-year history of smoking and currently smoke or have quit within the past 15 years.  Fecal occult blood test (FOBT) of the stool. You may have this test every year starting at age 45.  Flexible sigmoidoscopy or colonoscopy. You may have a sigmoidoscopy every 5 years or a colonoscopy every 10 years starting at age 20.  Hepatitis C blood test.  Hepatitis B blood test.  Sexually transmitted disease (STD) testing.  Diabetes screening. This is done by checking your blood sugar (glucose) after you have not eaten for a while (fasting). You may have this done every 1-3 years.  Bone density scan. This is done to screen for osteoporosis. You may have this done starting at age 23.  Mammogram. This may be done every 1-2 years. Talk to your health care provider about how often you should have regular mammograms. Talk with your health care provider about your test results, treatment options, and if necessary, the need for more tests. Vaccines  Your health care provider may recommend certain vaccines, such as:  Influenza vaccine. This is recommended every year.  Tetanus, diphtheria, and acellular pertussis (Tdap, Td) vaccine. You may need a Td booster every 10 years.  Zoster vaccine. You may need this after age 52.  Pneumococcal 13-valent conjugate (PCV13) vaccine. One dose is recommended after age 38.  Pneumococcal polysaccharide (PPSV23) vaccine. One dose is recommended after age 59. Talk to your health care provider about which screenings and vaccines you need and how  often you need them. This information is not intended to replace advice given to you by your health care provider. Make sure you discuss any questions you have with your health care provider. Document Released: 11/14/2015 Document Revised: 07/07/2016 Document Reviewed: 08/19/2015 Elsevier Interactive Patient Education  2017 [employer] Prevention in the Home Falls can cause injuries. They can happen to people of all ages. There are many things you can do to make your home safe and to help prevent falls. What can I do on the outside of my home?  Regularly fix the edges of walkways and driveways and fix any cracks.  Remove anything that might make you trip as you walk through a door, such as a raised step or threshold.  Trim any bushes or trees on the path to your home.  Use bright outdoor lighting.  Clear any walking paths of anything that might make someone trip, such as rocks or tools.  Regularly check to see if handrails are loose or broken. Make sure that both sides of any steps have handrails.  Any raised decks and porches should have guardrails on the edges.  Have any leaves, snow, or ice cleared regularly.  Use sand or salt on walking paths during winter.  Clean up any spills in your garage right away. This includes oil or grease spills. What can I do in the bathroom?  Use night lights.  Install grab bars by the toilet and in the tub and shower. Do not use towel bars as grab bars.  Use non-skid mats or decals in the tub or shower.  If you need to sit down in the shower, use a plastic, non-slip stool.  Keep the floor dry. Clean up any water that spills on the floor as soon as it happens.  Remove soap buildup in the tub or shower regularly.  Attach bath mats securely with double-sided non-slip rug tape.  Do not have throw rugs and other things on the floor that can make you trip. What can I do in the bedroom?  Use night lights.  Make sure that you have a  light by your bed that is easy to reach.  Do not use any sheets or blankets that are too big for your bed. They should not hang down onto the floor.  Have a firm chair that has side arms. You can use this for support while you get dressed.  Do not have throw rugs and other things on the floor that can make you trip. What can I do in the kitchen?  Clean up any spills right away.  Avoid walking on wet floors.  Keep items that you use a lot in easy-to-reach places.  If you need to reach something above you, use a strong step stool that has a grab bar.  Keep electrical cords out of the way.  Do not use floor polish or wax that makes floors slippery. If you must use wax, use non-skid floor wax.  Do not have throw rugs and other things on the floor that can make you trip. What can I do with my stairs?  Do not leave any items on the stairs.  Make sure that there are handrails on both sides of the stairs and use them.  Fix handrails that are broken or loose. Make sure that handrails are as long as the stairways.  Check any carpeting to make sure that it is firmly attached to the stairs. Fix any carpet that is loose or worn.  Avoid having throw rugs at the top or bottom of the stairs. If you do have throw rugs, attach them to the floor with carpet tape.  Make sure that you have a light switch at the top of the stairs and the bottom of the stairs. If you do not have them, ask someone to add them for you. What else can I do to help prevent falls?  Wear shoes that:  Do not have high heels.  Have rubber bottoms.  Are comfortable and fit you well.  Are closed at the toe. Do not wear sandals.  If you use a stepladder:  Make sure that it is fully opened. Do not climb a closed stepladder.  Make sure that both sides of the stepladder are locked into place.  Ask someone to hold it for you, if possible.  Clearly mark and make sure that you can see:  Any grab bars or  handrails.  First and last steps.  Where the edge of each step is.  Use tools that help you move around (mobility aids) if they are needed. These include:  Canes.  Walkers.  Scooters.  Crutches.  Turn on the lights when you go into a dark area. Replace any light bulbs as soon as they burn out.  Set up your furniture so you have a clear path. Avoid moving your furniture around.  If any of your floors are uneven, fix them.  If there are any pets around you, be aware of where they are.  Review your medicines with your doctor. Some medicines can make you feel dizzy. This can increase your chance of falling. Ask your doctor what other things that you can do to help prevent falls. This information is not intended to replace advice given to you by your health care provider. Make sure you discuss any questions you have with your health care provider. Document Released: 08/14/2009 Document Revised: 03/25/2016 Document Reviewed: 11/22/2014 Elsevier Interactive Patient Education  2017 [employer].

## 2020-01-22 DIAGNOSIS — C61 Malignant neoplasm of prostate: Secondary | ICD-10-CM | POA: Diagnosis not present

## 2020-01-22 DIAGNOSIS — R972 Elevated prostate specific antigen [PSA]: Secondary | ICD-10-CM | POA: Diagnosis not present

## 2020-01-29 DIAGNOSIS — C61 Malignant neoplasm of prostate: Secondary | ICD-10-CM | POA: Diagnosis not present

## 2020-01-31 ENCOUNTER — Other Ambulatory Visit (HOSPITAL_COMMUNITY): Payer: Self-pay | Admitting: Urology

## 2020-01-31 ENCOUNTER — Other Ambulatory Visit: Payer: Self-pay | Admitting: Urology

## 2020-01-31 DIAGNOSIS — C61 Malignant neoplasm of prostate: Secondary | ICD-10-CM

## 2020-02-20 ENCOUNTER — Other Ambulatory Visit: Payer: Self-pay

## 2020-02-20 ENCOUNTER — Encounter (HOSPITAL_COMMUNITY)
Admission: RE | Admit: 2020-02-20 | Discharge: 2020-02-20 | Disposition: A | Discharge status: Home / Self Care | Payer: Medicare Other | Source: Ambulatory Visit | Attending: Urology | Admitting: Urology

## 2020-02-20 DIAGNOSIS — C61 Malignant neoplasm of prostate: Secondary | ICD-10-CM | POA: Diagnosis not present

## 2020-02-20 MED ORDER — TECHNETIUM TC 99M MEDRONATE IV KIT
21.3000 | PACK | Freq: Once | INTRAVENOUS | Status: AC
  Administered 2020-02-20: 21.3 via INTRAVENOUS

## 2020-02-21 ENCOUNTER — Other Ambulatory Visit: Payer: Self-pay | Admitting: Nurse Practitioner

## 2020-02-21 DIAGNOSIS — I2510 Atherosclerotic heart disease of native coronary artery without angina pectoris: Secondary | ICD-10-CM

## 2020-02-21 DIAGNOSIS — E782 Mixed hyperlipidemia: Secondary | ICD-10-CM

## 2020-02-21 DIAGNOSIS — E1149 Type 2 diabetes mellitus with other diabetic neurological complication: Secondary | ICD-10-CM | POA: Diagnosis not present

## 2020-02-21 DIAGNOSIS — I482 Chronic atrial fibrillation, unspecified: Secondary | ICD-10-CM

## 2020-02-21 NOTE — Telephone Encounter (Signed)
Patient pharmacy requesting refill on medication. Patient medication pend and sent to provider due to upcoming appointment. 02/27/2020.

## 2020-02-22 ENCOUNTER — Encounter: Payer: Self-pay | Admitting: Cardiology

## 2020-02-22 ENCOUNTER — Other Ambulatory Visit: Payer: Self-pay

## 2020-02-22 ENCOUNTER — Ambulatory Visit: Payer: Medicare Other | Admitting: Cardiology

## 2020-02-22 VITALS — BP 120/72 | HR 67 | Ht 69.0 in | Wt 169.0 lb

## 2020-02-22 DIAGNOSIS — I10 Essential (primary) hypertension: Secondary | ICD-10-CM | POA: Diagnosis not present

## 2020-02-22 DIAGNOSIS — I2510 Atherosclerotic heart disease of native coronary artery without angina pectoris: Secondary | ICD-10-CM

## 2020-02-22 DIAGNOSIS — E1149 Type 2 diabetes mellitus with other diabetic neurological complication: Secondary | ICD-10-CM | POA: Diagnosis not present

## 2020-02-22 DIAGNOSIS — I482 Chronic atrial fibrillation, unspecified: Secondary | ICD-10-CM

## 2020-02-22 DIAGNOSIS — E782 Mixed hyperlipidemia: Secondary | ICD-10-CM

## 2020-02-22 DIAGNOSIS — Z7901 Long term (current) use of anticoagulants: Secondary | ICD-10-CM

## 2020-02-22 LAB — BASIC METABOLIC PANEL
BUN: 22 mg/dL (ref 7–25)
CO2: 24 mmol/L (ref 20–32)
Calcium: 8.9 mg/dL (ref 8.6–10.3)
Chloride: 104 mmol/L (ref 98–110)
Creat: 1.06 mg/dL (ref 0.70–1.11)
Glucose, Bld: 124 mg/dL — ABNORMAL HIGH (ref 65–99)
Potassium: 4.6 mmol/L (ref 3.5–5.3)
Sodium: 139 mmol/L (ref 135–146)

## 2020-02-22 LAB — HEMOGLOBIN A1C
Hgb A1c MFr Bld: 6.5 % of total Hgb — ABNORMAL HIGH (ref <5.7)
Mean Plasma Glucose: 140 (calc)
eAG (mmol/L): 7.7 (calc)

## 2020-02-22 MED ORDER — RIVAROXABAN 20 MG PO TABS: 20.0000 mg | ORAL_TABLET | Freq: Every day | ORAL | 1 refills | Status: DC

## 2020-02-22 MED ORDER — ATORVASTATIN CALCIUM 20 MG PO TABS: ORAL_TABLET | ORAL | 1 refills | Status: DC

## 2020-02-22 MED ORDER — DIGOXIN 125 MCG PO TABS: ORAL_TABLET | ORAL | 1 refills | Status: DC

## 2020-02-22 NOTE — Patient Instructions (Addendum)
Medication Instructions:  Your physician recommends that you continue on your current medications as directed. Please refer to the Current Medication list given to you today.  1.  All requested medications sent to pharmacy.    Labwork: -None  Testing/Procedures: Your physician has requested that you have an echocardiogram. Echocardiography is a painless test that uses sound waves to create images of your heart. It provides your doctor with information about the size and shape of your heart and how well your heart's chambers and valves are working. This procedure takes approximately one hour. There are no restrictions for this procedure.   Echocardiogram An echocardiogram is a procedure that uses painless sound waves (ultrasound) to produce an image of the heart. Images from an echocardiogram can provide important information about:  Signs of coronary artery disease (CAD).  Aneurysm detection. An aneurysm is a weak or damaged part of an artery wall that bulges out from the normal force of blood pumping through the body.  Heart size and shape. Changes in the size or shape of the heart can be associated with certain conditions, including heart failure, aneurysm, and CAD.  Heart muscle function.  Heart valve function.  Signs of a past heart attack.  Fluid buildup around the heart.  Thickening of the heart muscle.  A tumor or infectious growth around the heart valves. Tell a health care provider about:  Any allergies you have.  All medicines you are taking, including vitamins, herbs, eye drops, creams, and over-the-counter medicines.  Any blood disorders you have.  Any surgeries you have had.  Any medical conditions you have.  Whether you are pregnant or may be pregnant. What are the risks? Generally, this is a safe procedure. However, problems may occur, including:  Allergic reaction to dye (contrast) that may be used during the procedure. What happens before the  procedure? No specific preparation is needed. You may eat and drink normally. What happens during the procedure?   An IV tube may be inserted into one of your veins.  You may receive contrast through this tube. A contrast is an injection that improves the quality of the pictures from your heart.  A gel will be applied to your chest.  A wand-like tool (transducer) will be moved over your chest. The gel will help to transmit the sound waves from the transducer.  The sound waves will harmlessly bounce off of your heart to allow the heart images to be captured in real-time motion. The images will be recorded on a computer. The procedure may vary among health care providers and hospitals. What happens after the procedure?  You may return to your normal, everyday life, including diet, activities, and medicines, unless your health care provider tells you not to do that. Summary  An echocardiogram is a procedure that uses painless sound waves (ultrasound) to produce an image of the heart.  Images from an echocardiogram can provide important information about the size and shape of your heart, heart muscle function, heart valve function, and fluid buildup around your heart.  You do not need to do anything to prepare before this procedure. You may eat and drink normally.  After the echocardiogram is completed, you may return to your normal, everyday life, unless your health care provider tells you not to do that. This information is not intended to replace advice given to you by your health care provider. Make sure you discuss any questions you have with your health care provider. Document Revised: 02/08/2019 Document Reviewed: 11/20/2016  Elsevier Patient Education  2020 [place]: Your physician wants you to follow-up in: 6 months with Dr. Fraser Din.  You will receive a reminder letter in the mail two months in advance. If you don't receive a letter, please call our office to  schedule the follow-up appointment.    Any Other Special Instructions Will Be Listed Below (If Applicable).     If you need a refill on your cardiac medications before your next appointment, please call your pharmacy.
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
## 2020-02-26 ENCOUNTER — Other Ambulatory Visit: Payer: Self-pay | Admitting: Cardiology

## 2020-02-27 ENCOUNTER — Encounter: Payer: Medicare Other | Admitting: Internal Medicine

## 2020-02-27 ENCOUNTER — Other Ambulatory Visit: Payer: Self-pay

## 2020-02-29 ENCOUNTER — Ambulatory Visit (HOSPITAL_BASED_OUTPATIENT_CLINIC_OR_DEPARTMENT_OTHER): Admission: RE | Admit: 2020-02-29 | Payer: Medicare Other | Source: Ambulatory Visit

## 2020-03-04 DIAGNOSIS — C61 Malignant neoplasm of prostate: Secondary | ICD-10-CM | POA: Diagnosis not present

## 2020-03-04 DIAGNOSIS — N200 Calculus of kidney: Secondary | ICD-10-CM | POA: Diagnosis not present

## 2020-03-05 ENCOUNTER — Other Ambulatory Visit: Payer: Self-pay

## 2020-03-05 ENCOUNTER — Encounter: Payer: Self-pay | Admitting: Internal Medicine

## 2020-03-05 ENCOUNTER — Non-Acute Institutional Stay: Payer: Medicare Other | Admitting: Internal Medicine

## 2020-03-05 VITALS — BP 126/72 | HR 69 | Temp 97.4°F | Ht 69.0 in | Wt 171.0 lb

## 2020-03-05 DIAGNOSIS — E1149 Type 2 diabetes mellitus with other diabetic neurological complication: Secondary | ICD-10-CM | POA: Diagnosis not present

## 2020-03-05 DIAGNOSIS — I2510 Atherosclerotic heart disease of native coronary artery without angina pectoris: Secondary | ICD-10-CM

## 2020-03-05 DIAGNOSIS — E782 Mixed hyperlipidemia: Secondary | ICD-10-CM

## 2020-03-05 DIAGNOSIS — I10 Essential (primary) hypertension: Secondary | ICD-10-CM

## 2020-03-05 DIAGNOSIS — I482 Chronic atrial fibrillation, unspecified: Secondary | ICD-10-CM

## 2020-03-05 DIAGNOSIS — C61 Malignant neoplasm of prostate: Secondary | ICD-10-CM

## 2020-03-06 ENCOUNTER — Other Ambulatory Visit: Payer: Self-pay

## 2020-03-06 ENCOUNTER — Ambulatory Visit (HOSPITAL_BASED_OUTPATIENT_CLINIC_OR_DEPARTMENT_OTHER)
Admission: RE | Admit: 2020-03-06 | Discharge: 2020-03-06 | Disposition: A | Discharge status: Home / Self Care | Payer: Medicare Other | Source: Ambulatory Visit | Attending: Cardiology | Admitting: Cardiology

## 2020-03-06 DIAGNOSIS — E782 Mixed hyperlipidemia: Secondary | ICD-10-CM | POA: Insufficient documentation

## 2020-03-06 DIAGNOSIS — I2510 Atherosclerotic heart disease of native coronary artery without angina pectoris: Secondary | ICD-10-CM | POA: Insufficient documentation

## 2020-03-06 DIAGNOSIS — Z7901 Long term (current) use of anticoagulants: Secondary | ICD-10-CM | POA: Diagnosis not present

## 2020-03-06 DIAGNOSIS — E1149 Type 2 diabetes mellitus with other diabetic neurological complication: Secondary | ICD-10-CM | POA: Diagnosis not present

## 2020-03-06 DIAGNOSIS — I482 Chronic atrial fibrillation, unspecified: Secondary | ICD-10-CM | POA: Insufficient documentation

## 2020-03-06 DIAGNOSIS — I10 Essential (primary) hypertension: Secondary | ICD-10-CM | POA: Insufficient documentation

## 2020-03-06 NOTE — Progress Notes (Signed)
  Echocardiogram 2D Echocardiogram has been performed.  Ryan Cochran 03/06/2020, 1:38 PM
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
## 2020-03-17 ENCOUNTER — Encounter: Payer: Self-pay | Admitting: Radiation Oncology

## 2020-03-17 NOTE — Progress Notes (Signed)
GU Location of Tumor / Histology: prostatic adenocarcinoma  If Prostate Cancer, Gleason Score is (4 + 5) and PSA is (11.3). Prostate volume: 63 cc.    Biopsies of prostate (if applicable) revealed:    Past/Anticipated interventions by urology, if any: finasteride, tamsulosin, prostate biospy, CT scan (negative), bone scan (negative), referral for consideration of radiotherapy, Eligard  Past/Anticipated interventions by medical oncology, if any: no  Weight changes, if any: denies  Bowel/Bladder complaints, if any: IPSS 11. SHIM 1. Denies dysuria or hematuria. Reports occasional urinary leakage. Denies any bowel complaints.    Nausea/Vomiting, if any: denies  Pain issues, if any:  denies  SAFETY ISSUES:  Prior radiation? denies  Pacemaker/ICD? denies  Possible current pregnancy? no, male patient  Is the patient on methotrexate? denies  Current Complaints / other details:  86 year old male. Widowed. Wife passed January 2021. Scheduled to follow up at [facility] on 03/28/2020.

## 2020-03-18 ENCOUNTER — Telehealth: Payer: Self-pay | Admitting: Radiation Oncology

## 2020-03-18 ENCOUNTER — Ambulatory Visit: Payer: Medicare Other | Admitting: Radiation Oncology

## 2020-03-18 ENCOUNTER — Ambulatory Visit: Payer: Medicare Other

## 2020-03-18 ENCOUNTER — Encounter: Payer: Self-pay | Admitting: Radiation Oncology

## 2020-03-18 ENCOUNTER — Ambulatory Visit
Admission: RE | Admit: 2020-03-18 | Discharge: 2020-03-18 | Disposition: A | Discharge status: Home / Self Care | Payer: Medicare Other | Source: Ambulatory Visit | Attending: Radiation Oncology | Admitting: Radiation Oncology

## 2020-03-18 ENCOUNTER — Other Ambulatory Visit: Payer: Self-pay

## 2020-03-18 VITALS — Ht 69.5 in | Wt 165.0 lb

## 2020-03-18 DIAGNOSIS — R972 Elevated prostate specific antigen [PSA]: Secondary | ICD-10-CM | POA: Diagnosis not present

## 2020-03-18 DIAGNOSIS — N400 Enlarged prostate without lower urinary tract symptoms: Secondary | ICD-10-CM | POA: Diagnosis not present

## 2020-03-18 DIAGNOSIS — Z79899 Other long term (current) drug therapy: Secondary | ICD-10-CM | POA: Diagnosis not present

## 2020-03-18 DIAGNOSIS — C61 Malignant neoplasm of prostate: Secondary | ICD-10-CM | POA: Insufficient documentation

## 2020-03-18 HISTORY — DX: Malignant neoplasm of prostate: C61

## 2020-03-18 NOTE — Progress Notes (Signed)
See progress note under physician encounter. 
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
## 2020-03-21 ENCOUNTER — Encounter: Payer: Self-pay | Admitting: Medical Oncology

## 2020-03-21 NOTE — Progress Notes (Signed)
Left message to introduce myself as the prostate nurse navigator and briefly discussed my role. He has chosen ADT with radiation to treat his prostate cancer. He did receive ADT 5/4 and will be scheduled for gold markers/SpaceOar in late June. I gave him my contact information and asked him to call me with questions or concerns.

## 2020-04-16 ENCOUNTER — Ambulatory Visit: Payer: Medicare Other | Admitting: Cardiology

## 2020-04-16 ENCOUNTER — Other Ambulatory Visit: Payer: Self-pay | Admitting: Urology

## 2020-04-16 DIAGNOSIS — C61 Malignant neoplasm of prostate: Secondary | ICD-10-CM

## 2020-05-06 ENCOUNTER — Telehealth: Payer: Self-pay | Admitting: *Deleted

## 2020-05-06 DIAGNOSIS — C61 Malignant neoplasm of prostate: Secondary | ICD-10-CM | POA: Diagnosis not present

## 2020-05-06 NOTE — Telephone Encounter (Signed)
CALLED PATIENT TO REMIND OF APPTS. FOR 05-09-20, LVM FOR A RETURN CALL

## 2020-05-07 ENCOUNTER — Telehealth: Payer: Self-pay | Admitting: *Deleted

## 2020-05-07 NOTE — Telephone Encounter (Signed)
CALLED PATIENT TO REMIND OF SIM APPT. AND MRI FOR 05-09-20- ARRIVAL TIME FOR SIM 1:15 PM @ [facility] AND HIS MRI - ARRIVAL TIME- 2:30 PM @ [facility], SPOKE WITH PATIENT AND HE IS AWARE OF THESE APPTS.

## 2020-05-09 ENCOUNTER — Ambulatory Visit (HOSPITAL_COMMUNITY)
Admission: RE | Admit: 2020-05-09 | Discharge: 2020-05-09 | Disposition: A | Discharge status: Home / Self Care | Payer: Medicare Other | Source: Ambulatory Visit | Attending: Urology | Admitting: Urology

## 2020-05-09 ENCOUNTER — Ambulatory Visit
Admission: RE | Admit: 2020-05-09 | Discharge: 2020-05-09 | Disposition: A | Discharge status: Home / Self Care | Payer: Medicare Other | Source: Ambulatory Visit | Attending: Radiation Oncology | Admitting: Radiation Oncology

## 2020-05-09 ENCOUNTER — Encounter: Payer: Self-pay | Admitting: Medical Oncology

## 2020-05-09 ENCOUNTER — Other Ambulatory Visit: Payer: Self-pay

## 2020-05-09 DIAGNOSIS — Z510 Encounter for antineoplastic radiation therapy: Secondary | ICD-10-CM | POA: Insufficient documentation

## 2020-05-09 DIAGNOSIS — C61 Malignant neoplasm of prostate: Secondary | ICD-10-CM | POA: Insufficient documentation

## 2020-05-09 NOTE — Progress Notes (Signed)
  Radiation Oncology         [phone]) [phone] ________________________________  Name: Ryan Cochran. MRN: [id number]  Date: 05/09/2020  DOB: Oct 04, 1933  SIMULATION AND TREATMENT PLANNING NOTE  No diagnosis found.  DIAGNOSIS:  86 y.o. gentleman with Stage T1c adenocarcinoma of the prostate with Gleason score of 4+5, and PSA of 11.3 (22.6 adjusted for finasteride).  NARRATIVE:  The patient was brought to the [facility].  Identity was confirmed.  All relevant records and images related to the planned course of therapy were reviewed.  The patient freely provided informed written consent to proceed with treatment after reviewing the details related to the planned course of therapy. The consent form was witnessed and verified by the simulation staff.  Then, the patient was set-up in a stable reproducible supine position for radiation therapy.  A vacuum lock pillow device was custom fabricated to position his legs in a reproducible immobilized position.  Then, I performed a urethrogram under sterile conditions to identify the prostatic bed.  CT images were obtained.  Surface markings were placed.  The CT images were loaded into the planning software.  Then the prostate bed target, pelvic lymph node target and avoidance structures including the rectum, bladder, bowel and hips were contoured.  Treatment planning then occurred.  The radiation prescription was entered and confirmed.  A total of one complex treatment devices were fabricated. I have requested : Intensity Modulated Radiotherapy (IMRT) is medically necessary for this case for the following reason:  Rectal sparing.[name]  PLAN:  The patient will receive 45 Gy in 25 fractions of 1.8 Gy, followed by a boost to the prostate to a total dose of 75 Gy with 15 additional fractions of 2 Gy.   ________________________________  Sheral Apley Tammi Klippel, M.D.

## 2020-05-14 DIAGNOSIS — C61 Malignant neoplasm of prostate: Secondary | ICD-10-CM | POA: Diagnosis not present

## 2020-05-14 DIAGNOSIS — Z510 Encounter for antineoplastic radiation therapy: Secondary | ICD-10-CM | POA: Diagnosis not present

## 2020-05-20 ENCOUNTER — Other Ambulatory Visit: Payer: Self-pay

## 2020-05-20 ENCOUNTER — Ambulatory Visit
Admission: RE | Admit: 2020-05-20 | Discharge: 2020-05-20 | Disposition: A | Discharge status: Home / Self Care | Payer: Medicare Other | Source: Ambulatory Visit | Attending: Radiation Oncology | Admitting: Radiation Oncology

## 2020-05-20 DIAGNOSIS — Z510 Encounter for antineoplastic radiation therapy: Secondary | ICD-10-CM | POA: Diagnosis not present

## 2020-05-20 DIAGNOSIS — C61 Malignant neoplasm of prostate: Secondary | ICD-10-CM | POA: Diagnosis not present

## 2020-05-21 ENCOUNTER — Other Ambulatory Visit: Payer: Self-pay

## 2020-05-21 ENCOUNTER — Ambulatory Visit
Admission: RE | Admit: 2020-05-21 | Discharge: 2020-05-21 | Disposition: A | Discharge status: Home / Self Care | Payer: Medicare Other | Source: Ambulatory Visit | Attending: Radiation Oncology | Admitting: Radiation Oncology

## 2020-05-21 DIAGNOSIS — C61 Malignant neoplasm of prostate: Secondary | ICD-10-CM | POA: Diagnosis not present

## 2020-05-21 DIAGNOSIS — Z510 Encounter for antineoplastic radiation therapy: Secondary | ICD-10-CM | POA: Diagnosis not present

## 2020-05-22 ENCOUNTER — Ambulatory Visit
Admission: RE | Admit: 2020-05-22 | Discharge: 2020-05-22 | Disposition: A | Discharge status: Home / Self Care | Payer: Medicare Other | Source: Ambulatory Visit | Attending: Radiation Oncology | Admitting: Radiation Oncology

## 2020-05-22 ENCOUNTER — Other Ambulatory Visit: Payer: Self-pay

## 2020-05-22 DIAGNOSIS — C61 Malignant neoplasm of prostate: Secondary | ICD-10-CM | POA: Diagnosis not present

## 2020-05-22 DIAGNOSIS — Z510 Encounter for antineoplastic radiation therapy: Secondary | ICD-10-CM | POA: Diagnosis not present

## 2020-05-23 ENCOUNTER — Encounter: Payer: Self-pay | Admitting: Medical Oncology

## 2020-05-23 ENCOUNTER — Ambulatory Visit
Admission: RE | Admit: 2020-05-23 | Discharge: 2020-05-23 | Disposition: A | Discharge status: Home / Self Care | Payer: Medicare Other | Source: Ambulatory Visit | Attending: Radiation Oncology | Admitting: Radiation Oncology

## 2020-05-23 ENCOUNTER — Other Ambulatory Visit: Payer: Self-pay

## 2020-05-23 DIAGNOSIS — Z510 Encounter for antineoplastic radiation therapy: Secondary | ICD-10-CM | POA: Diagnosis not present

## 2020-05-23 DIAGNOSIS — C61 Malignant neoplasm of prostate: Secondary | ICD-10-CM | POA: Diagnosis not present

## 2020-05-26 ENCOUNTER — Other Ambulatory Visit: Payer: Self-pay

## 2020-05-26 ENCOUNTER — Ambulatory Visit
Admission: RE | Admit: 2020-05-26 | Discharge: 2020-05-26 | Disposition: A | Discharge status: Home / Self Care | Payer: Medicare Other | Source: Ambulatory Visit | Attending: Radiation Oncology | Admitting: Radiation Oncology

## 2020-05-26 DIAGNOSIS — Z510 Encounter for antineoplastic radiation therapy: Secondary | ICD-10-CM | POA: Diagnosis not present

## 2020-05-26 DIAGNOSIS — C61 Malignant neoplasm of prostate: Secondary | ICD-10-CM | POA: Diagnosis not present

## 2020-05-27 ENCOUNTER — Ambulatory Visit
Admission: RE | Admit: 2020-05-27 | Discharge: 2020-05-27 | Disposition: A | Discharge status: Home / Self Care | Payer: Medicare Other | Source: Ambulatory Visit | Attending: Radiation Oncology | Admitting: Radiation Oncology

## 2020-05-27 ENCOUNTER — Other Ambulatory Visit: Payer: Self-pay

## 2020-05-27 DIAGNOSIS — Z510 Encounter for antineoplastic radiation therapy: Secondary | ICD-10-CM | POA: Diagnosis not present

## 2020-05-27 DIAGNOSIS — C61 Malignant neoplasm of prostate: Secondary | ICD-10-CM | POA: Diagnosis not present

## 2020-05-27 NOTE — Progress Notes (Signed)
Patient started radiation 7/20 and states treatments are going well. After weekly follow up, received education with Sam. I encouraged him to call with questions or concerns. He voiced understanding.

## 2020-05-28 ENCOUNTER — Other Ambulatory Visit: Payer: Self-pay

## 2020-05-28 ENCOUNTER — Ambulatory Visit
Admission: RE | Admit: 2020-05-28 | Discharge: 2020-05-28 | Disposition: A | Discharge status: Home / Self Care | Payer: Medicare Other | Source: Ambulatory Visit | Attending: Radiation Oncology | Admitting: Radiation Oncology

## 2020-05-28 DIAGNOSIS — Z510 Encounter for antineoplastic radiation therapy: Secondary | ICD-10-CM | POA: Diagnosis not present

## 2020-05-28 DIAGNOSIS — C61 Malignant neoplasm of prostate: Secondary | ICD-10-CM | POA: Diagnosis not present

## 2020-05-29 ENCOUNTER — Ambulatory Visit
Admission: RE | Admit: 2020-05-29 | Discharge: 2020-05-29 | Disposition: A | Discharge status: Home / Self Care | Payer: Medicare Other | Source: Ambulatory Visit | Attending: Radiation Oncology | Admitting: Radiation Oncology

## 2020-05-29 ENCOUNTER — Other Ambulatory Visit: Payer: Self-pay

## 2020-05-29 DIAGNOSIS — Z510 Encounter for antineoplastic radiation therapy: Secondary | ICD-10-CM | POA: Diagnosis not present

## 2020-05-29 DIAGNOSIS — C61 Malignant neoplasm of prostate: Secondary | ICD-10-CM | POA: Diagnosis not present

## 2020-05-30 ENCOUNTER — Other Ambulatory Visit: Payer: Self-pay

## 2020-05-30 ENCOUNTER — Encounter: Payer: Self-pay | Admitting: Radiation Oncology

## 2020-05-30 ENCOUNTER — Ambulatory Visit
Admission: RE | Admit: 2020-05-30 | Discharge: 2020-05-30 | Disposition: A | Discharge status: Home / Self Care | Payer: Medicare Other | Source: Ambulatory Visit | Attending: Radiation Oncology | Admitting: Radiation Oncology

## 2020-05-30 DIAGNOSIS — C61 Malignant neoplasm of prostate: Secondary | ICD-10-CM | POA: Diagnosis not present

## 2020-05-30 DIAGNOSIS — Z510 Encounter for antineoplastic radiation therapy: Secondary | ICD-10-CM | POA: Diagnosis not present

## 2020-05-30 NOTE — Progress Notes (Signed)
  Radiation Oncology         [phone] ________________________________  Name: Ryan Cochran. MRN: [id number]  Date: 05/30/2020  DOB: Nov 13, 1932  VIRTUAL SIMULATION NOTE  NARRATIVE:  The patient underwent simulation today for ongoing radiation therapy.  The existing CT study set was employed for the purpose of virtual treatment planning.  The target and avoidance structures were reviewed and in some cases modified.  Treatment planning then occurred.  The radiation boost prescription was entered and confirmed.  I have requested : Isodose Plan.   PLAN:  This modified radiation beam arrangement is intended to continue the current radiation dose to an additional 30 Gy in 15 fractions for a total cumulative dose of 2 Gy.  ------------------------------------------------  Sheral Apley. Tammi Klippel, M.D.

## 2020-06-02 ENCOUNTER — Ambulatory Visit
Admission: RE | Admit: 2020-06-02 | Discharge: 2020-06-02 | Disposition: A | Discharge status: Home / Self Care | Payer: Medicare Other | Source: Ambulatory Visit | Attending: Radiation Oncology | Admitting: Radiation Oncology

## 2020-06-02 ENCOUNTER — Other Ambulatory Visit: Payer: Self-pay

## 2020-06-02 DIAGNOSIS — Z510 Encounter for antineoplastic radiation therapy: Secondary | ICD-10-CM | POA: Insufficient documentation

## 2020-06-02 DIAGNOSIS — C61 Malignant neoplasm of prostate: Secondary | ICD-10-CM | POA: Diagnosis not present

## 2020-06-03 ENCOUNTER — Ambulatory Visit
Admission: RE | Admit: 2020-06-03 | Discharge: 2020-06-03 | Disposition: A | Discharge status: Home / Self Care | Payer: Medicare Other | Source: Ambulatory Visit | Attending: Radiation Oncology | Admitting: Radiation Oncology

## 2020-06-03 ENCOUNTER — Other Ambulatory Visit: Payer: Self-pay

## 2020-06-03 DIAGNOSIS — Z510 Encounter for antineoplastic radiation therapy: Secondary | ICD-10-CM | POA: Diagnosis not present

## 2020-06-03 DIAGNOSIS — C61 Malignant neoplasm of prostate: Secondary | ICD-10-CM | POA: Diagnosis not present

## 2020-06-04 ENCOUNTER — Ambulatory Visit
Admission: RE | Admit: 2020-06-04 | Discharge: 2020-06-04 | Disposition: A | Discharge status: Home / Self Care | Payer: Medicare Other | Source: Ambulatory Visit | Attending: Radiation Oncology | Admitting: Radiation Oncology

## 2020-06-04 ENCOUNTER — Other Ambulatory Visit: Payer: Self-pay

## 2020-06-04 DIAGNOSIS — C61 Malignant neoplasm of prostate: Secondary | ICD-10-CM | POA: Diagnosis not present

## 2020-06-04 DIAGNOSIS — Z510 Encounter for antineoplastic radiation therapy: Secondary | ICD-10-CM | POA: Diagnosis not present

## 2020-06-05 ENCOUNTER — Other Ambulatory Visit: Payer: Self-pay

## 2020-06-05 ENCOUNTER — Ambulatory Visit
Admission: RE | Admit: 2020-06-05 | Discharge: 2020-06-05 | Disposition: A | Discharge status: Home / Self Care | Payer: Medicare Other | Source: Ambulatory Visit | Attending: Radiation Oncology | Admitting: Radiation Oncology

## 2020-06-05 DIAGNOSIS — Z510 Encounter for antineoplastic radiation therapy: Secondary | ICD-10-CM | POA: Diagnosis not present

## 2020-06-05 DIAGNOSIS — C61 Malignant neoplasm of prostate: Secondary | ICD-10-CM | POA: Diagnosis not present

## 2020-06-06 ENCOUNTER — Ambulatory Visit
Admission: RE | Admit: 2020-06-06 | Discharge: 2020-06-06 | Disposition: A | Discharge status: Home / Self Care | Payer: Medicare Other | Source: Ambulatory Visit | Attending: Radiation Oncology | Admitting: Radiation Oncology

## 2020-06-06 DIAGNOSIS — Z510 Encounter for antineoplastic radiation therapy: Secondary | ICD-10-CM | POA: Diagnosis not present

## 2020-06-06 DIAGNOSIS — C61 Malignant neoplasm of prostate: Secondary | ICD-10-CM | POA: Diagnosis not present

## 2020-06-09 ENCOUNTER — Ambulatory Visit
Admission: RE | Admit: 2020-06-09 | Discharge: 2020-06-09 | Disposition: A | Discharge status: Home / Self Care | Payer: Medicare Other | Source: Ambulatory Visit | Attending: Radiation Oncology | Admitting: Radiation Oncology

## 2020-06-09 DIAGNOSIS — C61 Malignant neoplasm of prostate: Secondary | ICD-10-CM | POA: Diagnosis not present

## 2020-06-09 DIAGNOSIS — Z510 Encounter for antineoplastic radiation therapy: Secondary | ICD-10-CM | POA: Diagnosis not present

## 2020-06-10 ENCOUNTER — Ambulatory Visit
Admission: RE | Admit: 2020-06-10 | Discharge: 2020-06-10 | Disposition: A | Discharge status: Home / Self Care | Payer: Medicare Other | Source: Ambulatory Visit | Attending: Radiation Oncology | Admitting: Radiation Oncology

## 2020-06-10 ENCOUNTER — Other Ambulatory Visit: Payer: Self-pay

## 2020-06-10 DIAGNOSIS — Z510 Encounter for antineoplastic radiation therapy: Secondary | ICD-10-CM | POA: Diagnosis not present

## 2020-06-10 DIAGNOSIS — C61 Malignant neoplasm of prostate: Secondary | ICD-10-CM | POA: Diagnosis not present

## 2020-06-11 ENCOUNTER — Other Ambulatory Visit: Payer: Self-pay

## 2020-06-11 ENCOUNTER — Ambulatory Visit
Admission: RE | Admit: 2020-06-11 | Discharge: 2020-06-11 | Disposition: A | Discharge status: Home / Self Care | Payer: Medicare Other | Source: Ambulatory Visit | Attending: Radiation Oncology | Admitting: Radiation Oncology

## 2020-06-11 DIAGNOSIS — C61 Malignant neoplasm of prostate: Secondary | ICD-10-CM | POA: Diagnosis not present

## 2020-06-11 DIAGNOSIS — Z510 Encounter for antineoplastic radiation therapy: Secondary | ICD-10-CM | POA: Diagnosis not present

## 2020-06-12 ENCOUNTER — Ambulatory Visit
Admission: RE | Admit: 2020-06-12 | Discharge: 2020-06-12 | Disposition: A | Discharge status: Home / Self Care | Payer: Medicare Other | Source: Ambulatory Visit | Attending: Radiation Oncology | Admitting: Radiation Oncology

## 2020-06-12 ENCOUNTER — Other Ambulatory Visit: Payer: Self-pay

## 2020-06-12 DIAGNOSIS — Z510 Encounter for antineoplastic radiation therapy: Secondary | ICD-10-CM | POA: Diagnosis not present

## 2020-06-12 DIAGNOSIS — C61 Malignant neoplasm of prostate: Secondary | ICD-10-CM | POA: Diagnosis not present

## 2020-06-13 ENCOUNTER — Other Ambulatory Visit: Payer: Self-pay

## 2020-06-13 ENCOUNTER — Ambulatory Visit
Admission: RE | Admit: 2020-06-13 | Discharge: 2020-06-13 | Disposition: A | Discharge status: Home / Self Care | Payer: Medicare Other | Source: Ambulatory Visit | Attending: Radiation Oncology | Admitting: Radiation Oncology

## 2020-06-13 DIAGNOSIS — C61 Malignant neoplasm of prostate: Secondary | ICD-10-CM | POA: Diagnosis not present

## 2020-06-13 DIAGNOSIS — Z510 Encounter for antineoplastic radiation therapy: Secondary | ICD-10-CM | POA: Diagnosis not present

## 2020-06-16 ENCOUNTER — Other Ambulatory Visit: Payer: Self-pay

## 2020-06-16 ENCOUNTER — Ambulatory Visit
Admission: RE | Admit: 2020-06-16 | Discharge: 2020-06-16 | Disposition: A | Discharge status: Home / Self Care | Payer: Medicare Other | Source: Ambulatory Visit | Attending: Radiation Oncology | Admitting: Radiation Oncology

## 2020-06-16 DIAGNOSIS — Z510 Encounter for antineoplastic radiation therapy: Secondary | ICD-10-CM | POA: Diagnosis not present

## 2020-06-16 DIAGNOSIS — C61 Malignant neoplasm of prostate: Secondary | ICD-10-CM | POA: Diagnosis not present

## 2020-06-17 ENCOUNTER — Other Ambulatory Visit: Payer: Self-pay

## 2020-06-17 ENCOUNTER — Ambulatory Visit
Admission: RE | Admit: 2020-06-17 | Discharge: 2020-06-17 | Disposition: A | Discharge status: Home / Self Care | Payer: Medicare Other | Source: Ambulatory Visit | Attending: Radiation Oncology | Admitting: Radiation Oncology

## 2020-06-17 DIAGNOSIS — C61 Malignant neoplasm of prostate: Secondary | ICD-10-CM | POA: Diagnosis not present

## 2020-06-17 DIAGNOSIS — Z510 Encounter for antineoplastic radiation therapy: Secondary | ICD-10-CM | POA: Diagnosis not present

## 2020-06-18 ENCOUNTER — Ambulatory Visit
Admission: RE | Admit: 2020-06-18 | Discharge: 2020-06-18 | Disposition: A | Discharge status: Home / Self Care | Payer: Medicare Other | Source: Ambulatory Visit | Attending: Radiation Oncology | Admitting: Radiation Oncology

## 2020-06-18 ENCOUNTER — Other Ambulatory Visit: Payer: Self-pay

## 2020-06-18 DIAGNOSIS — C61 Malignant neoplasm of prostate: Secondary | ICD-10-CM | POA: Diagnosis not present

## 2020-06-18 DIAGNOSIS — Z510 Encounter for antineoplastic radiation therapy: Secondary | ICD-10-CM | POA: Diagnosis not present

## 2020-06-19 ENCOUNTER — Ambulatory Visit
Admission: RE | Admit: 2020-06-19 | Discharge: 2020-06-19 | Disposition: A | Discharge status: Home / Self Care | Payer: Medicare Other | Source: Ambulatory Visit | Attending: Radiation Oncology | Admitting: Radiation Oncology

## 2020-06-19 ENCOUNTER — Other Ambulatory Visit: Payer: Self-pay

## 2020-06-19 DIAGNOSIS — C61 Malignant neoplasm of prostate: Secondary | ICD-10-CM | POA: Diagnosis not present

## 2020-06-19 DIAGNOSIS — Z510 Encounter for antineoplastic radiation therapy: Secondary | ICD-10-CM | POA: Diagnosis not present

## 2020-06-20 ENCOUNTER — Ambulatory Visit
Admission: RE | Admit: 2020-06-20 | Discharge: 2020-06-20 | Disposition: A | Discharge status: Home / Self Care | Payer: Medicare Other | Source: Ambulatory Visit | Attending: Radiation Oncology | Admitting: Radiation Oncology

## 2020-06-20 ENCOUNTER — Other Ambulatory Visit: Payer: Self-pay

## 2020-06-20 DIAGNOSIS — Z510 Encounter for antineoplastic radiation therapy: Secondary | ICD-10-CM | POA: Diagnosis not present

## 2020-06-20 DIAGNOSIS — C61 Malignant neoplasm of prostate: Secondary | ICD-10-CM | POA: Diagnosis not present

## 2020-06-23 ENCOUNTER — Encounter: Payer: Self-pay | Admitting: Medical Oncology

## 2020-06-23 ENCOUNTER — Ambulatory Visit
Admission: RE | Admit: 2020-06-23 | Discharge: 2020-06-23 | Disposition: A | Discharge status: Home / Self Care | Payer: Medicare Other | Source: Ambulatory Visit | Attending: Radiation Oncology | Admitting: Radiation Oncology

## 2020-06-23 ENCOUNTER — Other Ambulatory Visit: Payer: Self-pay

## 2020-06-23 DIAGNOSIS — C61 Malignant neoplasm of prostate: Secondary | ICD-10-CM | POA: Diagnosis not present

## 2020-06-23 DIAGNOSIS — Z510 Encounter for antineoplastic radiation therapy: Secondary | ICD-10-CM | POA: Diagnosis not present

## 2020-06-24 ENCOUNTER — Other Ambulatory Visit: Payer: Self-pay

## 2020-06-24 ENCOUNTER — Ambulatory Visit
Admission: RE | Admit: 2020-06-24 | Discharge: 2020-06-24 | Disposition: A | Discharge status: Home / Self Care | Payer: Medicare Other | Source: Ambulatory Visit | Attending: Radiation Oncology | Admitting: Radiation Oncology

## 2020-06-24 DIAGNOSIS — C61 Malignant neoplasm of prostate: Secondary | ICD-10-CM | POA: Diagnosis not present

## 2020-06-24 DIAGNOSIS — Z510 Encounter for antineoplastic radiation therapy: Secondary | ICD-10-CM | POA: Diagnosis not present

## 2020-06-25 ENCOUNTER — Other Ambulatory Visit: Payer: Self-pay

## 2020-06-25 ENCOUNTER — Ambulatory Visit
Admission: RE | Admit: 2020-06-25 | Discharge: 2020-06-25 | Disposition: A | Discharge status: Home / Self Care | Payer: Medicare Other | Source: Ambulatory Visit | Attending: Radiation Oncology | Admitting: Radiation Oncology

## 2020-06-25 DIAGNOSIS — Z510 Encounter for antineoplastic radiation therapy: Secondary | ICD-10-CM | POA: Diagnosis not present

## 2020-06-25 DIAGNOSIS — C61 Malignant neoplasm of prostate: Secondary | ICD-10-CM | POA: Diagnosis not present

## 2020-06-26 ENCOUNTER — Ambulatory Visit
Admission: RE | Admit: 2020-06-26 | Discharge: 2020-06-26 | Disposition: A | Discharge status: Home / Self Care | Payer: Medicare Other | Source: Ambulatory Visit | Attending: Radiation Oncology | Admitting: Radiation Oncology

## 2020-06-26 ENCOUNTER — Other Ambulatory Visit: Payer: Self-pay

## 2020-06-26 DIAGNOSIS — C61 Malignant neoplasm of prostate: Secondary | ICD-10-CM | POA: Diagnosis not present

## 2020-06-26 DIAGNOSIS — Z510 Encounter for antineoplastic radiation therapy: Secondary | ICD-10-CM | POA: Diagnosis not present

## 2020-06-27 ENCOUNTER — Ambulatory Visit
Admission: RE | Admit: 2020-06-27 | Discharge: 2020-06-27 | Disposition: A | Discharge status: Home / Self Care | Payer: Medicare Other | Source: Ambulatory Visit | Attending: Radiation Oncology | Admitting: Radiation Oncology

## 2020-06-27 DIAGNOSIS — Z510 Encounter for antineoplastic radiation therapy: Secondary | ICD-10-CM | POA: Diagnosis not present

## 2020-06-27 DIAGNOSIS — C61 Malignant neoplasm of prostate: Secondary | ICD-10-CM | POA: Diagnosis not present

## 2020-06-30 ENCOUNTER — Ambulatory Visit
Admission: RE | Admit: 2020-06-30 | Discharge: 2020-06-30 | Disposition: A | Discharge status: Home / Self Care | Payer: Medicare Other | Source: Ambulatory Visit | Attending: Radiation Oncology | Admitting: Radiation Oncology

## 2020-06-30 DIAGNOSIS — Z510 Encounter for antineoplastic radiation therapy: Secondary | ICD-10-CM | POA: Diagnosis not present

## 2020-06-30 DIAGNOSIS — C61 Malignant neoplasm of prostate: Secondary | ICD-10-CM | POA: Diagnosis not present

## 2020-07-01 ENCOUNTER — Ambulatory Visit
Admission: RE | Admit: 2020-07-01 | Discharge: 2020-07-01 | Disposition: A | Discharge status: Home / Self Care | Payer: Medicare Other | Source: Ambulatory Visit | Attending: Radiation Oncology | Admitting: Radiation Oncology

## 2020-07-01 ENCOUNTER — Other Ambulatory Visit: Payer: Self-pay

## 2020-07-01 DIAGNOSIS — C61 Malignant neoplasm of prostate: Secondary | ICD-10-CM | POA: Diagnosis not present

## 2020-07-01 DIAGNOSIS — Z510 Encounter for antineoplastic radiation therapy: Secondary | ICD-10-CM | POA: Diagnosis not present

## 2020-07-02 ENCOUNTER — Ambulatory Visit
Admission: RE | Admit: 2020-07-02 | Discharge: 2020-07-02 | Disposition: A | Discharge status: Home / Self Care | Payer: Medicare Other | Source: Ambulatory Visit | Attending: Radiation Oncology | Admitting: Radiation Oncology

## 2020-07-02 DIAGNOSIS — Z510 Encounter for antineoplastic radiation therapy: Secondary | ICD-10-CM | POA: Diagnosis not present

## 2020-07-02 DIAGNOSIS — C61 Malignant neoplasm of prostate: Secondary | ICD-10-CM | POA: Diagnosis not present

## 2020-07-02 DIAGNOSIS — I10 Essential (primary) hypertension: Secondary | ICD-10-CM | POA: Diagnosis not present

## 2020-07-02 DIAGNOSIS — I482 Chronic atrial fibrillation, unspecified: Secondary | ICD-10-CM | POA: Diagnosis not present

## 2020-07-02 DIAGNOSIS — E1149 Type 2 diabetes mellitus with other diabetic neurological complication: Secondary | ICD-10-CM | POA: Diagnosis not present

## 2020-07-02 DIAGNOSIS — E782 Mixed hyperlipidemia: Secondary | ICD-10-CM | POA: Diagnosis not present

## 2020-07-03 ENCOUNTER — Ambulatory Visit
Admission: RE | Admit: 2020-07-03 | Discharge: 2020-07-03 | Disposition: A | Discharge status: Home / Self Care | Payer: Medicare Other | Source: Ambulatory Visit | Attending: Radiation Oncology | Admitting: Radiation Oncology

## 2020-07-03 ENCOUNTER — Other Ambulatory Visit: Payer: Self-pay

## 2020-07-03 DIAGNOSIS — C61 Malignant neoplasm of prostate: Secondary | ICD-10-CM | POA: Diagnosis not present

## 2020-07-03 DIAGNOSIS — I482 Chronic atrial fibrillation, unspecified: Secondary | ICD-10-CM

## 2020-07-03 DIAGNOSIS — E1149 Type 2 diabetes mellitus with other diabetic neurological complication: Secondary | ICD-10-CM

## 2020-07-03 DIAGNOSIS — Z510 Encounter for antineoplastic radiation therapy: Secondary | ICD-10-CM | POA: Diagnosis not present

## 2020-07-03 DIAGNOSIS — I10 Essential (primary) hypertension: Secondary | ICD-10-CM

## 2020-07-03 DIAGNOSIS — E782 Mixed hyperlipidemia: Secondary | ICD-10-CM

## 2020-07-04 ENCOUNTER — Other Ambulatory Visit: Payer: Self-pay

## 2020-07-04 ENCOUNTER — Ambulatory Visit
Admission: RE | Admit: 2020-07-04 | Discharge: 2020-07-04 | Disposition: A | Discharge status: Home / Self Care | Payer: Medicare Other | Source: Ambulatory Visit | Attending: Radiation Oncology | Admitting: Radiation Oncology

## 2020-07-04 DIAGNOSIS — Z510 Encounter for antineoplastic radiation therapy: Secondary | ICD-10-CM | POA: Diagnosis not present

## 2020-07-04 DIAGNOSIS — C61 Malignant neoplasm of prostate: Secondary | ICD-10-CM | POA: Diagnosis not present

## 2020-07-04 LAB — HEMOGLOBIN A1C
Hgb A1c MFr Bld: 6.6 % of total Hgb — ABNORMAL HIGH (ref <5.7)
Mean Plasma Glucose: 143 (calc)
eAG (mmol/L): 7.9 (calc)

## 2020-07-04 LAB — COMPLETE METABOLIC PANEL WITH GFR
AG Ratio: 1.6 (calc) (ref 1.0–2.5)
ALT: 15 U/L (ref 9–46)
AST: 16 U/L (ref 10–35)
Albumin: 3.4 g/dL — ABNORMAL LOW (ref 3.6–5.1)
Alkaline phosphatase (APISO): 63 U/L (ref 35–144)
BUN/Creatinine Ratio: 23 (calc) — ABNORMAL HIGH (ref 6–22)
BUN: 26 mg/dL — ABNORMAL HIGH (ref 7–25)
CO2: 24 mmol/L (ref 20–32)
Calcium: 8.5 mg/dL — ABNORMAL LOW (ref 8.6–10.3)
Chloride: 108 mmol/L (ref 98–110)
Creat: 1.13 mg/dL — ABNORMAL HIGH (ref 0.70–1.11)
GFR, Est African American: 68 mL/min/{1.73_m2} (ref >=60)
GFR, Est Non African American: 59 mL/min/{1.73_m2} — ABNORMAL LOW (ref >=60)
Globulin: 2.1 g/dL (calc) (ref 1.9–3.7)
Glucose, Bld: 131 mg/dL — ABNORMAL HIGH (ref 65–99)
Potassium: 4.1 mmol/L (ref 3.5–5.3)
Sodium: 141 mmol/L (ref 135–146)
Total Bilirubin: 0.5 mg/dL (ref 0.2–1.2)
Total Protein: 5.5 g/dL — ABNORMAL LOW (ref 6.1–8.1)

## 2020-07-04 LAB — LIPID PANEL
Cholesterol: 75 mg/dL (ref <200)
HDL: 27 mg/dL — ABNORMAL LOW (ref >=40)
LDL Cholesterol (Calc): 32 mg/dL (calc)
Non-HDL Cholesterol (Calc): 48 mg/dL (calc) (ref <130)
Total CHOL/HDL Ratio: 2.8 (calc) (ref <5.0)
Triglycerides: 82 mg/dL (ref <150)

## 2020-07-04 LAB — CBC WITH DIFFERENTIAL/PLATELET
Absolute Monocytes: 733 cells/uL (ref 200–950)
Basophils Absolute: 19 cells/uL (ref 0–200)
Basophils Relative: 0.4 %
Eosinophils Absolute: 423 cells/uL (ref 15–500)
Eosinophils Relative: 9 %
HCT: 36.5 % — ABNORMAL LOW (ref 38.5–50.0)
Hemoglobin: 12.4 g/dL — ABNORMAL LOW (ref 13.2–17.1)
Lymphs Abs: 555 cells/uL — ABNORMAL LOW (ref 850–3900)
MCH: 32.5 pg (ref 27.0–33.0)
MCHC: 34 g/dL (ref 32.0–36.0)
MCV: 95.5 fL (ref 80.0–100.0)
MPV: 10.4 fL (ref 7.5–12.5)
Monocytes Relative: 15.6 %
Neutro Abs: 2970 cells/uL (ref 1500–7800)
Neutrophils Relative %: 63.2 %
Platelets: 154 10*3/uL (ref 140–400)
RBC: 3.82 10*6/uL — ABNORMAL LOW (ref 4.20–5.80)
RDW: 14.4 % (ref 11.0–15.0)
Total Lymphocyte: 11.8 %
WBC: 4.7 10*3/uL (ref 3.8–10.8)

## 2020-07-08 ENCOUNTER — Ambulatory Visit
Admission: RE | Admit: 2020-07-08 | Discharge: 2020-07-08 | Disposition: A | Discharge status: Home / Self Care | Payer: Medicare Other | Source: Ambulatory Visit | Attending: Radiation Oncology | Admitting: Radiation Oncology

## 2020-07-08 ENCOUNTER — Other Ambulatory Visit: Payer: Self-pay

## 2020-07-08 DIAGNOSIS — Z510 Encounter for antineoplastic radiation therapy: Secondary | ICD-10-CM | POA: Diagnosis not present

## 2020-07-08 DIAGNOSIS — C61 Malignant neoplasm of prostate: Secondary | ICD-10-CM | POA: Diagnosis not present

## 2020-07-09 ENCOUNTER — Non-Acute Institutional Stay: Payer: Medicare Other | Admitting: Internal Medicine

## 2020-07-09 ENCOUNTER — Other Ambulatory Visit: Payer: Self-pay

## 2020-07-09 ENCOUNTER — Encounter: Payer: Self-pay | Admitting: Internal Medicine

## 2020-07-09 ENCOUNTER — Ambulatory Visit
Admission: RE | Admit: 2020-07-09 | Discharge: 2020-07-09 | Disposition: A | Discharge status: Home / Self Care | Payer: Medicare Other | Source: Ambulatory Visit | Attending: Radiation Oncology | Admitting: Radiation Oncology

## 2020-07-09 VITALS — BP 136/66 | HR 83 | Temp 96.9°F | Ht 69.5 in | Wt 171.8 lb

## 2020-07-09 DIAGNOSIS — Z510 Encounter for antineoplastic radiation therapy: Secondary | ICD-10-CM | POA: Diagnosis not present

## 2020-07-09 DIAGNOSIS — I10 Essential (primary) hypertension: Secondary | ICD-10-CM | POA: Diagnosis not present

## 2020-07-09 DIAGNOSIS — C61 Malignant neoplasm of prostate: Secondary | ICD-10-CM | POA: Diagnosis not present

## 2020-07-09 DIAGNOSIS — I482 Chronic atrial fibrillation, unspecified: Secondary | ICD-10-CM | POA: Diagnosis not present

## 2020-07-09 DIAGNOSIS — I2510 Atherosclerotic heart disease of native coronary artery without angina pectoris: Secondary | ICD-10-CM

## 2020-07-09 DIAGNOSIS — E782 Mixed hyperlipidemia: Secondary | ICD-10-CM | POA: Diagnosis not present

## 2020-07-09 DIAGNOSIS — E1149 Type 2 diabetes mellitus with other diabetic neurological complication: Secondary | ICD-10-CM | POA: Diagnosis not present
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
## 2020-07-10 ENCOUNTER — Ambulatory Visit
Admission: RE | Admit: 2020-07-10 | Discharge: 2020-07-10 | Disposition: A | Discharge status: Home / Self Care | Payer: Medicare Other | Source: Ambulatory Visit | Attending: Radiation Oncology | Admitting: Radiation Oncology

## 2020-07-10 ENCOUNTER — Other Ambulatory Visit: Payer: Self-pay

## 2020-07-10 DIAGNOSIS — C61 Malignant neoplasm of prostate: Secondary | ICD-10-CM | POA: Diagnosis not present

## 2020-07-10 DIAGNOSIS — Z510 Encounter for antineoplastic radiation therapy: Secondary | ICD-10-CM | POA: Diagnosis not present

## 2020-07-11 ENCOUNTER — Ambulatory Visit
Admission: RE | Admit: 2020-07-11 | Discharge: 2020-07-11 | Disposition: A | Discharge status: Home / Self Care | Payer: Medicare Other | Source: Ambulatory Visit | Attending: Radiation Oncology | Admitting: Radiation Oncology

## 2020-07-11 DIAGNOSIS — C61 Malignant neoplasm of prostate: Secondary | ICD-10-CM | POA: Diagnosis not present

## 2020-07-11 DIAGNOSIS — Z510 Encounter for antineoplastic radiation therapy: Secondary | ICD-10-CM | POA: Diagnosis not present

## 2020-07-14 ENCOUNTER — Ambulatory Visit
Admission: RE | Admit: 2020-07-14 | Discharge: 2020-07-14 | Disposition: A | Discharge status: Home / Self Care | Payer: Medicare Other | Source: Ambulatory Visit | Attending: Radiation Oncology | Admitting: Radiation Oncology

## 2020-07-14 DIAGNOSIS — Z510 Encounter for antineoplastic radiation therapy: Secondary | ICD-10-CM | POA: Diagnosis not present

## 2020-07-14 DIAGNOSIS — E119 Type 2 diabetes mellitus without complications: Secondary | ICD-10-CM | POA: Diagnosis not present

## 2020-07-14 DIAGNOSIS — C61 Malignant neoplasm of prostate: Secondary | ICD-10-CM | POA: Diagnosis not present

## 2020-07-14 DIAGNOSIS — H524 Presbyopia: Secondary | ICD-10-CM | POA: Diagnosis not present

## 2020-07-15 ENCOUNTER — Encounter: Payer: Self-pay | Admitting: Urology

## 2020-07-15 ENCOUNTER — Other Ambulatory Visit: Payer: Self-pay

## 2020-07-15 ENCOUNTER — Ambulatory Visit
Admission: RE | Admit: 2020-07-15 | Discharge: 2020-07-15 | Disposition: A | Discharge status: Home / Self Care | Payer: Medicare Other | Source: Ambulatory Visit | Attending: Radiation Oncology | Admitting: Radiation Oncology

## 2020-07-15 ENCOUNTER — Encounter: Payer: Self-pay | Admitting: Medical Oncology

## 2020-07-15 DIAGNOSIS — C61 Malignant neoplasm of prostate: Secondary | ICD-10-CM | POA: Diagnosis not present

## 2020-07-15 DIAGNOSIS — Z510 Encounter for antineoplastic radiation therapy: Secondary | ICD-10-CM | POA: Diagnosis not present

## 2020-08-20 ENCOUNTER — Other Ambulatory Visit: Payer: Self-pay

## 2020-08-20 ENCOUNTER — Encounter: Payer: Self-pay | Admitting: Urology

## 2020-08-20 ENCOUNTER — Ambulatory Visit
Admission: RE | Admit: 2020-08-20 | Discharge: 2020-08-20 | Disposition: A | Discharge status: Home / Self Care | Payer: Medicare Other | Source: Ambulatory Visit | Attending: Urology | Admitting: Urology

## 2020-08-20 DIAGNOSIS — C61 Malignant neoplasm of prostate: Secondary | ICD-10-CM

## 2020-08-20 NOTE — Progress Notes (Signed)
Patients meaningful use is complete. Patient IPPS is 15. Patient reports  that his next appointment with the urologist is on   November 9th. Patient reports that he empties his bladder most 1/2 the time. Patient reports some urgency. Patient reports nocturia x2. Patient denies any issues with his bowels.

## 2020-08-20 NOTE — Progress Notes (Signed)
Radiation Oncology         [phone]) [phone] ________________________________  Name: Ryan Cochran. MRN: [id number]  Date: 08/20/2020  DOB: [birth year]  Post Treatment Note  CC: [name], MD  Kathie Rhodes, MD  Diagnosis:   86 y.o. gentleman with Stage T1c adenocarcinoma of the prostate with Gleason score of 4+5, and PSA of 11.3 (22.6 adjusted for finasteride).  Interval Since Last Radiation:  5 weeks  05/20/20 - 07/15/20: concurrent with LT-ADT (6 month Eligard received 03/04/20) 1. The prostate, seminal vesicles, and pelvic lymph nodes were initially treated to 45 Gy in 25 fractions of 1.8 Gy  2. The prostate only was boosted to 75 Gy with 15 additional fractions of 2.0 Gy   Narrative:  I spoke with the patient to conduct his routine scheduled 1 month follow up visit via telephone to spare the patient unnecessary potential exposure in the healthcare setting during the current COVID-19 pandemic.  The patient was notified in advance and gave permission to proceed with this visit format.   He tolerated radiation treatment relatively well with only minor urinary irritation and modest fatigue. He continued to tolerate ADT well throughout treatment. He reported increased nocturia 5-6x/night, increased frequency, urgency, weak stream and intermittency despite taking Flomax daily. He also had some mild occasional dysuria but denied bowel issues.                               On review of systems, the patient states that he is doing very well in general.  He continues with mild residual urgency and nocturia x2 but reports significant improvement over the past week and continues taking Flomax daily as prescribed.  He specifically denies dysuria, gross hematuria, straining to void, incomplete bladder emptying or incontinence.  He reports a healthy appetite and is maintaining his weight.  He denies any abdominal pain, nausea, vomiting, diarrhea or constipation.  Overall, he is quite pleased with his  progress to date.  He has a scheduled follow-up visit with Dr. Tresa Moore at [facility] on 09/09/2020.  ALLERGIES:  has No Known Allergies.  Meds: Current Outpatient Medications  Medication Sig Dispense Refill  . acetaminophen (TYLENOL) 500 MG tablet 1-2 tablet every 12 hours for pain 30 tablet 0  . atorvastatin (LIPITOR) 20 MG tablet TAKE 1 TABLET(20 MG) BY MOUTH DAILY 90 tablet 1  . calcium-vitamin D (OSCAL WITH D) 250-125 MG-UNIT tablet Take 1 tablet by mouth daily.    . digoxin (LANOXIN) 0.125 MG tablet TAKE 1 TABLET BY MOUTH DAILY GENERIC EQUIVALENT FOR LANOXIN 90 tablet 1  . finasteride (PROSCAR) 5 MG tablet Take 5 mg by mouth daily.    [name] olmesartan (BENICAR) 40 MG tablet TAKE 1 TABLET BY MOUTH DAILY 90 tablet 3  . rivaroxaban (XARELTO) 20 MG TABS tablet Take 1 tablet (20 mg total) by mouth daily. 90 tablet 1  . tamsulosin (FLOMAX) 0.4 MG CAPS capsule Take 0.4 mg by mouth daily.    [name] tolterodine (DETROL LA) 4 MG 24 hr capsule Take 4 mg by mouth daily.     No current facility-administered medications for this encounter.    Physical Findings:  vitals were not taken for this visit.   /Unable to assess due to telephone follow up visit format.  Lab Findings: Lab Results  Component Value Date   WBC 4.7 07/02/2020   HGB 12.4 (L) 07/02/2020   HCT 36.5 (L) 07/02/2020  MCV 95.5 07/02/2020   PLT 154 07/02/2020     Radiographic Findings: No results found.  Impression/Plan: 1. 86 y.o. gentleman with Stage T1c adenocarcinoma of the prostate with Gleason score of 4+5, and PSA of 11.3 (22.6 adjusted for finasteride). He will continue to follow up with urology for ongoing PSA determinations and has an appointment scheduled with Dr. Tresa Moore on 09/09/2020 with a PSA to be drawn on 09/02/2020.  He will be due for his next ADT injection at that time as well.  He reports tolerating the ADT well and anticipates completing a full 2-year course of treatment under the care and direction of Dr.  Tresa Moore.  He understands what to expect with regards to PSA monitoring going forward. I will look forward to following his response to treatment via correspondence with urology, and would be happy to continue to participate in his care if clinically indicated. I talked to the patient about what to expect in the future, including his risk for erectile dysfunction and rectal bleeding. I encouraged him to call or return to the office if he has any questions regarding his previous radiation or possible radiation side effects. He was comfortable with this plan and will follow up as needed.    Ryan Johns, PA-C

## 2020-08-20 NOTE — Progress Notes (Signed)
  Radiation Oncology         [phone]) [phone] ________________________________  Name: Ryan Cochran. MRN: [id number]  Date: 07/15/2020  DOB: January 18, 1933  End of Treatment Note  Diagnosis:   86 y.o. gentleman with Stage T1c adenocarcinoma of the prostate with Gleason score of 4+5, and PSA of 11.3 (22.6 adjusted for finasteride).     Indication for treatment:  Curative, Definitive Radiotherapy       Radiation treatment dates:   05/20/20 - 07/15/20  Site/dose:  1. The prostate, seminal vesicles, and pelvic lymph nodes were initially treated to 45 Gy in 25 fractions of 1.8 Gy  2. The prostate only was boosted to 75 Gy with 15 additional fractions of 2.0 Gy   Beams/energy:  1. The prostate, seminal vesicles, and pelvic lymph nodes were initially treated using VMAT intensity modulated radiotherapy delivering 6 megavolt photons. Image guidance was performed with CB-CT studies prior to each fraction. He was immobilized with a body fix lower extremity mold.  2. the prostate only was boosted using VMAT intensity modulated radiotherapy delivering 6 megavolt photons. Image guidance was performed with CB-CT studies prior to each fraction. He was immobilized with a body fix lower extremity mold.  Narrative: The patient tolerated radiation treatment relatively well with only minor urinary irritation and modest fatigue. He continued to tolerate ADT well throughout treatment. He reported increased nocturia 5-6x/night, increased frequency, urgency, weak stream and intermittency despite taking Flomax daily. He also had some mild occasional dysuria but denied bowel issues.   Plan: The patient has completed radiation treatment. He will return to radiation oncology clinic for routine followup in one month. I advised him to call or return sooner if he has any questions or concerns related to his recovery or treatment. ________________________________  Sheral Apley. Tammi Klippel, M.D.

## 2020-08-21 ENCOUNTER — Other Ambulatory Visit: Payer: Self-pay | Admitting: Cardiology

## 2020-08-21 ENCOUNTER — Other Ambulatory Visit: Payer: Self-pay | Admitting: Nurse Practitioner

## 2020-08-21 DIAGNOSIS — E782 Mixed hyperlipidemia: Secondary | ICD-10-CM

## 2020-08-21 DIAGNOSIS — I482 Chronic atrial fibrillation, unspecified: Secondary | ICD-10-CM

## 2020-08-21 DIAGNOSIS — E1149 Type 2 diabetes mellitus with other diabetic neurological complication: Secondary | ICD-10-CM

## 2020-08-21 DIAGNOSIS — I2510 Atherosclerotic heart disease of native coronary artery without angina pectoris: Secondary | ICD-10-CM

## 2020-08-21 NOTE — Telephone Encounter (Signed)
Refill sent to pharmacy.   

## 2020-08-21 NOTE — Telephone Encounter (Signed)
Patient is requesting refill on medication 'Atorvastatin". Medication last filled on 02/22/2020 by outside provider Park Liter, MD. Medication pend and sent to provider [name], MD . Please Advise.

## 2020-09-02 DIAGNOSIS — C61 Malignant neoplasm of prostate: Secondary | ICD-10-CM | POA: Diagnosis not present

## 2020-09-09 DIAGNOSIS — Z5111 Encounter for antineoplastic chemotherapy: Secondary | ICD-10-CM | POA: Diagnosis not present

## 2020-09-09 DIAGNOSIS — R351 Nocturia: Secondary | ICD-10-CM | POA: Diagnosis not present

## 2020-09-09 DIAGNOSIS — C61 Malignant neoplasm of prostate: Secondary | ICD-10-CM | POA: Diagnosis not present

## 2020-09-23 DIAGNOSIS — C61 Malignant neoplasm of prostate: Secondary | ICD-10-CM | POA: Insufficient documentation

## 2020-09-23 DIAGNOSIS — L259 Unspecified contact dermatitis, unspecified cause: Secondary | ICD-10-CM | POA: Insufficient documentation

## 2020-09-23 DIAGNOSIS — N400 Enlarged prostate without lower urinary tract symptoms: Secondary | ICD-10-CM | POA: Insufficient documentation

## 2020-09-23 DIAGNOSIS — G629 Polyneuropathy, unspecified: Secondary | ICD-10-CM | POA: Insufficient documentation

## 2020-09-23 DIAGNOSIS — E785 Hyperlipidemia, unspecified: Secondary | ICD-10-CM | POA: Insufficient documentation

## 2020-09-23 DIAGNOSIS — B351 Tinea unguium: Secondary | ICD-10-CM | POA: Insufficient documentation

## 2020-09-24 ENCOUNTER — Other Ambulatory Visit: Payer: Self-pay

## 2020-09-24 ENCOUNTER — Encounter: Payer: Self-pay | Admitting: Cardiology

## 2020-09-24 ENCOUNTER — Ambulatory Visit: Payer: Medicare Other | Admitting: Cardiology

## 2020-09-24 VITALS — BP 120/60 | HR 66 | Ht 69.5 in | Wt 165.0 lb

## 2020-09-24 DIAGNOSIS — C61 Malignant neoplasm of prostate: Secondary | ICD-10-CM | POA: Diagnosis not present

## 2020-09-24 DIAGNOSIS — I2510 Atherosclerotic heart disease of native coronary artery without angina pectoris: Secondary | ICD-10-CM

## 2020-09-24 DIAGNOSIS — I10 Essential (primary) hypertension: Secondary | ICD-10-CM | POA: Diagnosis not present

## 2020-09-24 DIAGNOSIS — I482 Chronic atrial fibrillation, unspecified: Secondary | ICD-10-CM | POA: Diagnosis not present

## 2020-09-24 DIAGNOSIS — E1149 Type 2 diabetes mellitus with other diabetic neurological complication: Secondary | ICD-10-CM

## 2020-09-24 DIAGNOSIS — E782 Mixed hyperlipidemia: Secondary | ICD-10-CM

## 2020-09-24 MED ORDER — ATORVASTATIN CALCIUM 10 MG PO TABS: ORAL_TABLET | ORAL | 1 refills | Status: DC

## 2020-09-24 NOTE — Patient Instructions (Signed)
Medication Instructions:  Your physician has recommended you make the following change in your medication:   DECREASE: Lipitor 10 mg daily   *If you need a refill on your cardiac medications before your next appointment, please call your pharmacy*   Lab Work: None If you have labs (blood work) drawn today and your tests are completely normal, you will receive your results only by: [name] MyChart Message (if you have MyChart) OR . A paper copy in the mail If you have any lab test that is abnormal or we need to change your treatment, we will call you to review the results.   Testing/Procedures: None   Follow-Up: At [facility], you and your health needs are our priority.  As part of our continuing mission to provide you with exceptional heart care, we have created designated Provider Care Teams.  These Care Teams include your primary Cardiologist (physician) and Advanced Practice Providers (APPs -  Physician Assistants and Nurse Practitioners) who all work together to provide you with the care you need, when you need it.  We recommend signing up for the patient portal called "MyChart".  Sign up information is provided on this After Visit Summary.  MyChart is used to connect with patients for Virtual Visits (Telemedicine).  Patients are able to view lab/test results, encounter notes, upcoming appointments, etc.  Non-urgent messages can be sent to your provider as well.   To learn more about what you can do with MyChart, go to [email or web address].    Your next appointment:   6 month(s)  The format for your next appointment:   In Person  Provider:   [name], MD   Other Instructions
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
## 2020-09-24 NOTE — Addendum Note (Signed)
Addended by: Senaida Ores on: 09/24/2020 11:45 AM   Modules accepted: Orders

## 2020-11-05 ENCOUNTER — Telehealth: Payer: Self-pay

## 2020-11-05 NOTE — Telephone Encounter (Signed)
Patient was a walk-into the clinic with a note stating he has been having diarrhea problems for a numbr of weeks now and would like you to refer/recommend and GI. To Dr. Chales Abrahams

## 2020-11-06 ENCOUNTER — Other Ambulatory Visit: Payer: Self-pay | Admitting: Internal Medicine

## 2020-11-06 DIAGNOSIS — R197 Diarrhea, unspecified: Secondary | ICD-10-CM

## 2020-11-06 NOTE — Telephone Encounter (Signed)
Please let him know that I am making the referral but if his diarrhea gets worse he needs to come and see [place] in the clinic. Thanks

## 2020-11-07 NOTE — Telephone Encounter (Signed)
DWP. He states the diarrhea has stopped since placing a call to [place] so he may decline the apnt to GI when they call.

## 2020-11-13 ENCOUNTER — Other Ambulatory Visit: Payer: Self-pay

## 2020-11-13 DIAGNOSIS — I10 Essential (primary) hypertension: Secondary | ICD-10-CM | POA: Diagnosis not present

## 2020-11-13 DIAGNOSIS — E782 Mixed hyperlipidemia: Secondary | ICD-10-CM

## 2020-11-13 DIAGNOSIS — E1149 Type 2 diabetes mellitus with other diabetic neurological complication: Secondary | ICD-10-CM

## 2020-11-14 ENCOUNTER — Encounter: Payer: Self-pay | Admitting: *Deleted

## 2020-11-14 ENCOUNTER — Other Ambulatory Visit: Payer: Medicare Other

## 2020-11-14 ENCOUNTER — Ambulatory Visit: Payer: Medicare Other | Admitting: Internal Medicine

## 2020-11-14 VITALS — BP 124/68 | HR 70 | Ht 69.5 in | Wt 160.0 lb

## 2020-11-14 DIAGNOSIS — R194 Change in bowel habit: Secondary | ICD-10-CM

## 2020-11-14 DIAGNOSIS — R197 Diarrhea, unspecified: Secondary | ICD-10-CM

## 2020-11-14 LAB — CBC WITH DIFFERENTIAL/PLATELET
Absolute Monocytes: 593 cells/uL (ref 200–950)
Basophils Absolute: 30 cells/uL (ref 0–200)
Basophils Relative: 0.7 %
Eosinophils Absolute: 323 cells/uL (ref 15–500)
Eosinophils Relative: 7.5 %
HCT: 39 % (ref 38.5–50.0)
Hemoglobin: 13.2 g/dL (ref 13.2–17.1)
Lymphs Abs: 765 cells/uL — ABNORMAL LOW (ref 850–3900)
MCH: 32.6 pg (ref 27.0–33.0)
MCHC: 33.8 g/dL (ref 32.0–36.0)
MCV: 96.3 fL (ref 80.0–100.0)
MPV: 11.8 fL (ref 7.5–12.5)
Monocytes Relative: 13.8 %
Neutro Abs: 2589 cells/uL (ref 1500–7800)
Neutrophils Relative %: 60.2 %
Platelets: 164 10*3/uL (ref 140–400)
RBC: 4.05 10*6/uL — ABNORMAL LOW (ref 4.20–5.80)
RDW: 12.7 % (ref 11.0–15.0)
Total Lymphocyte: 17.8 %
WBC: 4.3 10*3/uL (ref 3.8–10.8)

## 2020-11-14 LAB — COMPLETE METABOLIC PANEL WITH GFR
AG Ratio: 1.6 (calc) (ref 1.0–2.5)
ALT: 20 U/L (ref 9–46)
AST: 17 U/L (ref 10–35)
Albumin: 3.4 g/dL — ABNORMAL LOW (ref 3.6–5.1)
Alkaline phosphatase (APISO): 63 U/L (ref 35–144)
BUN: 20 mg/dL (ref 7–25)
CO2: 27 mmol/L (ref 20–32)
Calcium: 8.4 mg/dL — ABNORMAL LOW (ref 8.6–10.3)
Chloride: 108 mmol/L (ref 98–110)
Creat: 0.92 mg/dL (ref 0.70–1.11)
GFR, Est African American: 88 mL/min/{1.73_m2} (ref >=60)
GFR, Est Non African American: 76 mL/min/{1.73_m2} (ref >=60)
Globulin: 2.1 g/dL (calc) (ref 1.9–3.7)
Glucose, Bld: 88 mg/dL (ref 65–99)
Potassium: 4.3 mmol/L (ref 3.5–5.3)
Sodium: 141 mmol/L (ref 135–146)
Total Bilirubin: 0.5 mg/dL (ref 0.2–1.2)
Total Protein: 5.5 g/dL — ABNORMAL LOW (ref 6.1–8.1)

## 2020-11-14 LAB — LIPID PANEL
Cholesterol: 78 mg/dL (ref <200)
HDL: 24 mg/dL — ABNORMAL LOW (ref >=40)
LDL Cholesterol (Calc): 38 mg/dL (calc)
Non-HDL Cholesterol (Calc): 54 mg/dL (calc) (ref <130)
Total CHOL/HDL Ratio: 3.3 (calc) (ref <5.0)
Triglycerides: 81 mg/dL (ref <150)

## 2020-11-14 LAB — HEMOGLOBIN A1C
Hgb A1c MFr Bld: 5.8 % of total Hgb — ABNORMAL HIGH (ref <5.7)
Mean Plasma Glucose: 120 mg/dL
eAG (mmol/L): 6.6 mmol/L

## 2020-11-14 LAB — TSH: TSH: 3.89 mIU/L (ref 0.40–4.50)
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
## 2020-11-14 NOTE — Patient Instructions (Signed)
Your provider has requested that you go to the basement level for lab work before leaving today. Press "B" on the elevator. The lab is located at the first door on the left as you exit the elevator.  If you are age 85 or older, your body mass index should be between 23-30. Your Body mass index is 23.29 kg/m. If this is out of the aforementioned range listed, please consider follow up with your Primary Care Provider.  If you are age 29 or younger, your body mass index should be between 19-25. Your Body mass index is 23.29 kg/m. If this is out of the aformentioned range listed, please consider follow up with your Primary Care Provider.   Due to recent changes in healthcare laws, you may see the results of your imaging and laboratory studies on MyChart before your provider has had a chance to review them.  We understand that in some cases there may be results that are confusing or concerning to you. Not all laboratory results come back in the same time frame and the provider may be waiting for multiple results in order to interpret others.  Please give [place] 48 hours in order for your provider to thoroughly review all the results before contacting the office for clarification of your results.

## 2020-11-19 ENCOUNTER — Other Ambulatory Visit: Payer: Self-pay

## 2020-11-19 ENCOUNTER — Encounter: Payer: Self-pay | Admitting: Internal Medicine

## 2020-11-19 ENCOUNTER — Non-Acute Institutional Stay: Payer: Medicare Other | Admitting: Internal Medicine

## 2020-11-19 ENCOUNTER — Other Ambulatory Visit: Payer: Medicare Other

## 2020-11-19 VITALS — BP 118/66 | HR 95 | Temp 96.5°F | Ht 69.5 in | Wt 159.2 lb

## 2020-11-19 DIAGNOSIS — E1149 Type 2 diabetes mellitus with other diabetic neurological complication: Secondary | ICD-10-CM | POA: Diagnosis not present

## 2020-11-19 DIAGNOSIS — R197 Diarrhea, unspecified: Secondary | ICD-10-CM

## 2020-11-19 DIAGNOSIS — R194 Change in bowel habit: Secondary | ICD-10-CM

## 2020-11-19 DIAGNOSIS — I482 Chronic atrial fibrillation, unspecified: Secondary | ICD-10-CM | POA: Diagnosis not present

## 2020-11-19 DIAGNOSIS — I10 Essential (primary) hypertension: Secondary | ICD-10-CM | POA: Diagnosis not present

## 2020-11-19 DIAGNOSIS — E782 Mixed hyperlipidemia: Secondary | ICD-10-CM

## 2020-11-19 DIAGNOSIS — I2510 Atherosclerotic heart disease of native coronary artery without angina pectoris: Secondary | ICD-10-CM

## 2020-11-19 DIAGNOSIS — C61 Malignant neoplasm of prostate: Secondary | ICD-10-CM
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
## 2020-11-22 LAB — GI PROFILE, STOOL, PCR

## 2020-11-24 LAB — FECAL LACTOFERRIN, QUANT
Fecal Lactoferrin: NEGATIVE
MICRO NUMBER:: 11433301
SPECIMEN QUALITY:: ADEQUATE

## 2020-11-24 LAB — OVA AND PARASITE EXAMINATION
CONCENTRATE RESULT:: NONE SEEN
MICRO NUMBER:: 11433315
SPECIMEN QUALITY:: ADEQUATE
TRICHROME RESULT:: NONE SEEN

## 2020-11-24 LAB — PANCREATIC ELASTASE, FECAL: Pancreatic Elastase-1, Stool: 283 mcg/g

## 2020-12-19 ENCOUNTER — Telehealth: Payer: Self-pay | Admitting: Internal Medicine

## 2020-12-19 ENCOUNTER — Other Ambulatory Visit: Payer: Self-pay

## 2020-12-19 MED ORDER — RIFAXIMIN 550 MG PO TABS: 550.0000 mg | ORAL_TABLET | Freq: Three times a day (TID) | ORAL | 0 refills | Status: DC

## 2020-12-19 NOTE — Telephone Encounter (Signed)
Spoke with pt and he is aware, samples left up front for him to pickup.

## 2020-12-19 NOTE — Telephone Encounter (Signed)
Would try rifaximin 550 mg tid x 14 days, likely will have to get samples and can ask drug reps for help here if needed After that if not better I would consider colonoscopy Fecal WBCs, infectious panel, O&P and elastase were normal earlier this year

## 2020-12-19 NOTE — Telephone Encounter (Signed)
Spoke with pt and let him know his stool studies were normal. Pt states the diarrhea had gotten better but now it has increased. Reports he has about 3 liquid stools in the morning and then does not have another one until night time. He has continued to lose wt. Reports this morning when he got out of the shower he weighed 149. Pt states Dr. Hilarie Fredrickson had mentioned a probiotic at one time. Wants to know what Dr. Hilarie Fredrickson recommends. Please advise.

## 2020-12-19 NOTE — Telephone Encounter (Signed)
Patient called states he has not heard anything regarding his last stool sample test. Is not doing well still losing a lot of weight and is requesting to speak with a nurse for advise.

## 2021-01-05 ENCOUNTER — Other Ambulatory Visit: Payer: Self-pay | Admitting: Cardiology

## 2021-01-05 DIAGNOSIS — I482 Chronic atrial fibrillation, unspecified: Secondary | ICD-10-CM

## 2021-01-05 DIAGNOSIS — I2510 Atherosclerotic heart disease of native coronary artery without angina pectoris: Secondary | ICD-10-CM

## 2021-01-05 DIAGNOSIS — E782 Mixed hyperlipidemia: Secondary | ICD-10-CM

## 2021-01-05 DIAGNOSIS — E1149 Type 2 diabetes mellitus with other diabetic neurological complication: Secondary | ICD-10-CM

## 2021-01-08 ENCOUNTER — Telehealth: Payer: Self-pay | Admitting: Internal Medicine

## 2021-01-08 NOTE — Telephone Encounter (Signed)
Left message for pt to call back  °

## 2021-01-08 NOTE — Telephone Encounter (Signed)
Inbound call from patient stating rifaximin medication did not help at all and is requesting a call back from a nurse to discuss next steps please.

## 2021-01-09 NOTE — Telephone Encounter (Signed)
Would have him return to see me or APP and we can discuss colonoscopy Will have to get permission to hold anticoag JMP

## 2021-01-09 NOTE — Telephone Encounter (Signed)
Pt aware and scheduled to see Dr. Hilarie Fredrickson 02/16/21 at 10:120am. Pt wanted to know what he could do for the diarrhea prior to appt. Discussed with pt that he could try Imodium. Pt aware.

## 2021-01-09 NOTE — Telephone Encounter (Signed)
Pt called and states that the xifaxan he took did not help him at all, he did not notice any change in his symptoms. Please advise.

## 2021-01-13 ENCOUNTER — Encounter: Payer: Self-pay | Admitting: Nurse Practitioner

## 2021-01-13 ENCOUNTER — Telehealth: Payer: Self-pay

## 2021-01-13 ENCOUNTER — Other Ambulatory Visit: Payer: Self-pay

## 2021-01-13 ENCOUNTER — Ambulatory Visit (INDEPENDENT_AMBULATORY_CARE_PROVIDER_SITE_OTHER): Payer: Medicare Other | Admitting: Nurse Practitioner

## 2021-01-13 DIAGNOSIS — Z0000 Encounter for general adult medical examination without abnormal findings: Secondary | ICD-10-CM
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
## 2021-01-13 NOTE — Patient Instructions (Signed)
Ryan Cochran , Thank you for taking time to come for your Medicare Wellness Visit. I appreciate your ongoing commitment to your health goals. Please review the following plan we discussed and let me know if I can assist you in the future.   Screening recommendations/referrals: Colonoscopy agedout Recommended yearly ophthalmology/optometry visit for glaucoma screening and checkup Recommended yearly dental visit for hygiene and checkup  Vaccinations: Influenza vaccine up to date Pneumococcal vaccine up to date Tdap vaccine RECOMMENDED to get at your local pharmacy Shingles vaccine RECOMMENDED to get at your local pharmacy  Advanced directives: on file.   Conditions/risks identified: advanced age, malnutrition  Next appointment: 1 year for AWV  Preventive Care 39 Years and Older, Male Preventive care refers to lifestyle choices and visits with your health care provider that can promote health and wellness. What does preventive care include?  A yearly physical exam. This is also called an annual well check.  Dental exams once or twice a year.  Routine eye exams. Ask your health care provider how often you should have your eyes checked.  Personal lifestyle choices, including:  Daily care of your teeth and gums.  Regular physical activity.  Eating a healthy diet.  Avoiding tobacco and drug use.  Limiting alcohol use.  Practicing safe sex.  Taking low doses of aspirin every day.  Taking vitamin and mineral supplements as recommended by your health care provider. What happens during an annual well check? The services and screenings done by your health care provider during your annual well check will depend on your age, overall health, lifestyle risk factors, and family history of disease. Counseling  Your health care provider may ask you questions about your:  Alcohol use.  Tobacco use.  Drug use.  Emotional well-being.  Home and relationship well-being.  Sexual  activity.  Eating habits.  History of falls.  Memory and ability to understand (cognition).  Work and work Statistician. Screening  You may have the following tests or measurements:  Height, weight, and BMI.  Blood pressure.  Lipid and cholesterol levels. These may be checked every 5 years, or more frequently if you are over 57 years old.  Skin check.  Lung cancer screening. You may have this screening every year starting at age 81 if you have a 30-pack-year history of smoking and currently smoke or have quit within the past 15 years.  Fecal occult blood test (FOBT) of the stool. You may have this test every year starting at age 48.  Flexible sigmoidoscopy or colonoscopy. You may have a sigmoidoscopy every 5 years or a colonoscopy every 10 years starting at age 34.  Prostate cancer screening. Recommendations will vary depending on your family history and other risks.  Hepatitis C blood test.  Hepatitis B blood test.  Sexually transmitted disease (STD) testing.  Diabetes screening. This is done by checking your blood sugar (glucose) after you have not eaten for a while (fasting). You may have this done every 1-3 years.  Abdominal aortic aneurysm (AAA) screening. You may need this if you are a current or former smoker.  Osteoporosis. You may be screened starting at age 71 if you are at high risk. Talk with your health care provider about your test results, treatment options, and if necessary, the need for more tests. Vaccines  Your health care provider may recommend certain vaccines, such as:  Influenza vaccine. This is recommended every year.  Tetanus, diphtheria, and acellular pertussis (Tdap, Td) vaccine. You may need a Td booster  every 10 years.  Zoster vaccine. You may need this after age 22.  Pneumococcal 13-valent conjugate (PCV13) vaccine. One dose is recommended after age 14.  Pneumococcal polysaccharide (PPSV23) vaccine. One dose is recommended after age  75. Talk to your health care provider about which screenings and vaccines you need and how often you need them. This information is not intended to replace advice given to you by your health care provider. Make sure you discuss any questions you have with your health care provider. Document Released: 11/14/2015 Document Revised: 07/07/2016 Document Reviewed: 08/19/2015 Elsevier Interactive Patient Education  2017 [place] Prevention in the Home Falls can cause injuries. They can happen to people of all ages. There are many things you can do to make your home safe and to help prevent falls. What can I do on the outside of my home?  Regularly fix the edges of walkways and driveways and fix any cracks.  Remove anything that might make you trip as you walk through a door, such as a raised step or threshold.  Trim any bushes or trees on the path to your home.  Use bright outdoor lighting.  Clear any walking paths of anything that might make someone trip, such as rocks or tools.  Regularly check to see if handrails are loose or broken. Make sure that both sides of any steps have handrails.  Any raised decks and porches should have guardrails on the edges.  Have any leaves, snow, or ice cleared regularly.  Use sand or salt on walking paths during winter.  Clean up any spills in your garage right away. This includes oil or grease spills. What can I do in the bathroom?  Use night lights.  Install grab bars by the toilet and in the tub and shower. Do not use towel bars as grab bars.  Use non-skid mats or decals in the tub or shower.  If you need to sit down in the shower, use a plastic, non-slip stool.  Keep the floor dry. Clean up any water that spills on the floor as soon as it happens.  Remove soap buildup in the tub or shower regularly.  Attach bath mats securely with double-sided non-slip rug tape.  Do not have throw rugs and other things on the floor that can make  you trip. What can I do in the bedroom?  Use night lights.  Make sure that you have a light by your bed that is easy to reach.  Do not use any sheets or blankets that are too big for your bed. They should not hang down onto the floor.  Have a firm chair that has side arms. You can use this for support while you get dressed.  Do not have throw rugs and other things on the floor that can make you trip. What can I do in the kitchen?  Clean up any spills right away.  Avoid walking on wet floors.  Keep items that you use a lot in easy-to-reach places.  If you need to reach something above you, use a strong step stool that has a grab bar.  Keep electrical cords out of the way.  Do not use floor polish or wax that makes floors slippery. If you must use wax, use non-skid floor wax.  Do not have throw rugs and other things on the floor that can make you trip. What can I do with my stairs?  Do not leave any items on the stairs.  Make  sure that there are handrails on both sides of the stairs and use them. Fix handrails that are broken or loose. Make sure that handrails are as long as the stairways.  Check any carpeting to make sure that it is firmly attached to the stairs. Fix any carpet that is loose or worn.  Avoid having throw rugs at the top or bottom of the stairs. If you do have throw rugs, attach them to the floor with carpet tape.  Make sure that you have a light switch at the top of the stairs and the bottom of the stairs. If you do not have them, ask someone to add them for you. What else can I do to help prevent falls?  Wear shoes that:  Do not have high heels.  Have rubber bottoms.  Are comfortable and fit you well.  Are closed at the toe. Do not wear sandals.  If you use a stepladder:  Make sure that it is fully opened. Do not climb a closed stepladder.  Make sure that both sides of the stepladder are locked into place.  Ask someone to hold it for you, if  possible.  Clearly mark and make sure that you can see:  Any grab bars or handrails.  First and last steps.  Where the edge of each step is.  Use tools that help you move around (mobility aids) if they are needed. These include:  Canes.  Walkers.  Scooters.  Crutches.  Turn on the lights when you go into a dark area. Replace any light bulbs as soon as they burn out.  Set up your furniture so you have a clear path. Avoid moving your furniture around.  If any of your floors are uneven, fix them.  If there are any pets around you, be aware of where they are.  Review your medicines with your doctor. Some medicines can make you feel dizzy. This can increase your chance of falling. Ask your doctor what other things that you can do to help prevent falls. This information is not intended to replace advice given to you by your health care provider. Make sure you discuss any questions you have with your health care provider. Document Released: 08/14/2009 Document Revised: 03/25/2016 Document Reviewed: 11/22/2014 Elsevier Interactive Patient Education  2017 [employer].

## 2021-01-13 NOTE — Telephone Encounter (Signed)
Ryan Cochran, Ryan Cochran are scheduled for a virtual visit with your provider today.    Just as we do with appointments in the office, we must obtain your consent to participate.  Your consent will be active for this visit and any virtual visit you may have with one of our providers in the next 365 days.    If you have a MyChart account, I can also send a copy of this consent to you electronically.  All virtual visits are billed to your insurance company just like a traditional visit in the office.  As this is a virtual visit, video technology does not allow for your provider to perform a traditional examination.  This may limit your provider's ability to fully assess your condition.  If your provider identifies any concerns that need to be evaluated in person or the need to arrange testing such as labs, EKG, etc, we will make arrangements to do so.    Although advances in technology are sophisticated, we cannot ensure that it will always work on either your end or our end.  If the connection with a video visit is poor, we may have to switch to a telephone visit.  With either a video or telephone visit, we are not always able to ensure that we have a secure connection.   I need to obtain your verbal consent now.   Are you willing to proceed with your visit today?   Ryan Cochran. has provided verbal consent on 01/13/2021 for a virtual visit (video or telephone).   Carroll Kinds, [facility] 01/13/2021  9:29 AM

## 2021-01-13 NOTE — Progress Notes (Signed)
This service is provided via telemedicine  No vital signs collected/recorded due to the encounter was a telemedicine visit.   Location of patient (ex: home, work):  Home  Patient consents to a telephone visit: Yes, see encounter dated 01/13/2021  Location of the provider (ex: office, home): [place]  Name of any referring provider: Veleta Miners, MD  Names of all persons participating in the telemedicine service and their role in the encounter:  Sherrie Mustache, Nurse Practitioner, Carroll Kinds, CMA, and patient.   Time spent on call: 12 minutes with medical assistant

## 2021-02-16 ENCOUNTER — Ambulatory Visit: Payer: Medicare Other | Admitting: Internal Medicine

## 2021-02-16 ENCOUNTER — Telehealth: Payer: Self-pay | Admitting: *Deleted

## 2021-02-16 ENCOUNTER — Encounter: Payer: Self-pay | Admitting: Internal Medicine

## 2021-02-16 VITALS — BP 82/48 | HR 76 | Ht 69.5 in | Wt 145.6 lb

## 2021-02-16 DIAGNOSIS — R634 Abnormal weight loss: Secondary | ICD-10-CM | POA: Diagnosis not present

## 2021-02-16 DIAGNOSIS — R197 Diarrhea, unspecified: Secondary | ICD-10-CM | POA: Diagnosis not present

## 2021-02-16 DIAGNOSIS — Z9049 Acquired absence of other specified parts of digestive tract: Secondary | ICD-10-CM

## 2021-02-16 DIAGNOSIS — Z8719 Personal history of other diseases of the digestive system: Secondary | ICD-10-CM | POA: Diagnosis not present

## 2021-02-16 DIAGNOSIS — Z8546 Personal history of malignant neoplasm of prostate: Secondary | ICD-10-CM

## 2021-02-16 MED ORDER — PANCRELIPASE (LIP-PROT-AMYL) 36000-114000 UNITS PO CPEP: ORAL_CAPSULE | ORAL | 11 refills | Status: DC

## 2021-02-16 NOTE — Progress Notes (Signed)
Subjective:    Patient ID: Ryan Cochran., male    DOB: July 22, 1933, 86 y.o.   MRN: [id number]  HPI Ryan Cochran is an 87 year old male with a history of now chronic diarrhea, prostate cancer status post XRT, atrial fibrillation on Xarelto, hypertension, hyperlipidemia, colonic diverticulosis, prior bowel repair in 1956 after abdominal trauma resulting in peritonitis who is here for follow-up.  He is here alone today.  I saw him in consult in January 2022 for diarrhea.  After his last visit he submitted stool studies including a GI profile, fecal lactoferrin, pancreatic elastase and ova and parasite exam.  These were negative/normal.  His pancreatic elastase was 283.  After infectious profile was negative we treated him with rifaximin which he took for 2 weeks.  This did not change his diarrhea at all.   He reports that he has continued to have issues with diarrhea.  He has been using loperamide 4 to 6 mg/day.  This helps some of the time.  He is still having issues with diarrhea and loose stools.  He typically has 2 events per day in the morning and in the afternoon but during the events he has multiple episodes of loose stools.  He does notice gas and bloating which is worse in the evening.  He does not have abdominal pain.  He is not often waking at night with diarrhea.  He has noticed a decrease in appetite in the evening.  He has a good appetite for breakfast and lunch.  He has lost nearly 20 pounds.  He continues Xarelto.  In review his symptoms started in November 2021.  He has never had a colonoscopy and reports he has always been afraid of them particularly since his major bowel surgery in the 1950s.  This occurred after blunt injury during a softball game.  Review of Systems As per HPI, otherwise negative  Current Medications, Allergies, Past Medical History, Past Surgical History, Family History and Social History were reviewed in [facility] record.      Objective:   Physical Exam BP (!) 82/48   Pulse 76   Ht 5' 9.5" (1.765 m)   Wt 145 lb 9.6 oz (66 kg)   BMI 21.19 kg/m  Gen: awake, alert, NAD HEENT: anicteric Ext: no c/c/e Neuro: nonfocal  CBC    Component Value Date/Time   WBC 4.3 11/13/2020 0810   RBC 4.05 (L) 11/13/2020 0810   HGB 13.2 11/13/2020 0810   HCT 39.0 11/13/2020 0810   PLT 164 11/13/2020 0810   MCV 96.3 11/13/2020 0810   MCH 32.6 11/13/2020 0810   MCHC 33.8 11/13/2020 0810   RDW 12.7 11/13/2020 0810   LYMPHSABS 765 (L) 11/13/2020 0810   EOSABS 323 11/13/2020 0810   BASOSABS 30 11/13/2020 0810   CMP     Component Value Date/Time   NA 141 11/13/2020 0810   NA 143 08/03/2016 0000   K 4.3 11/13/2020 0810   CL 108 11/13/2020 0810   CO2 27 11/13/2020 0810   GLUCOSE 88 11/13/2020 0810   BUN 20 11/13/2020 0810   BUN 22 (A) 08/03/2016 0000   CREATININE 0.92 11/13/2020 0810   CALCIUM 8.4 (L) 11/13/2020 0810   PROT 5.5 (L) 11/13/2020 0810   ALBUMIN 3.8 06/20/2017 0740   AST 17 11/13/2020 0810   ALT 20 11/13/2020 0810   ALKPHOS 53 06/20/2017 0740   BILITOT 0.5 11/13/2020 0810   GFRNONAA 76 11/13/2020 0810   GFRAA 88 11/13/2020 0810  Assessment & Plan:   87 year old male with a history of now chronic diarrhea, prostate cancer status post XRT, atrial fibrillation on Xarelto, hypertension, hyperlipidemia, colonic diverticulosis, prior bowel repair in 1956 after abdominal trauma resulting in peritonitis who is here for follow-up.   1.  Ongoing diarrhea/weight loss --I recommended colonoscopy but he is very concerned about complications pacifically perforation.  He has a history of abdominal trauma, peritonitis and bowel resection in the 1950s.  We discussed the differential for his diarrhea which includes microscopic colitis, colitis, malignancy.  He would prefer a less invasive approach before colonoscopy if possible.  He did have cross-sectional imaging of the abdomen pelvis in April 2021 which was  reviewed today.  On the CT the stomach was unremarkable.  Duodenum was normal.  Small bowel was normal.  No colonic mass.  Colonic wall was not thickened.  There was diverticular changes in the left colon without diverticulitis. -- We will proceed with virtual colonoscopy; this because of his concern of colonic complications with colonoscopy in the setting of his prior peritonitis and bowel resection and at his current age.  I did state that this would not be able to pick up a microscopic colitis. -- He can slightly increase his loperamide up to 12 mg in any 24-hour. -- If virtual colonoscopy is positive we would proceed with colonoscopy; if it is negative we would consider flexible sigmoidoscopy for biopsy to exclude microscopic colitis -- His fecal elastase was technically normal however given his weight loss I am going to see if he would be willing to try Creon 36,000 units 2 capsules with meals, 1 with snacks.  30 minutes total spent today including patient facing time, coordination of care, reviewing medical history/procedures/pertinent radiology studies, and documentation of the encounter.

## 2021-02-16 NOTE — Telephone Encounter (Signed)
-----   Message from Jerene Bears, MD sent at 02/16/2021 12:10 PM EDT ----- On writing his note I would like to try him on Creon: -- His fecal elastase was technically normal but with his weight loss I think this is reasonable --36,000 units; 2 with meals 1 with snacks. --Please see if we can give him enough samples for a 1 week trial --When he is using this he should avoid the loperamide so that we can see if this is working --Explain to him that this is pancreatic enzyme replacement and that if it is going to be helpful it should be obviously helpful in controlling the diarrhea as well as the gas and bloating. --Proceed with the other work-up as scheduled Thanks JMP

## 2021-02-16 NOTE — Patient Instructions (Signed)
You have been scheduled for a CT virtual colonoscopy at [place] ( [address] location). Your appointment is scheduled for 03/17/21 at 9:00 am, 8:50 am arrival. Please go to [place] at least 72 hours prior to your exam to get your prep and instructions. If you need to reschedule your appointment or have specific questions regarding this test, please contact [facility] at [phone].  Please purchase the following medications over the counter and take as directed: Loperamide- You may take up to 6 tablets daily as needed for diarrhea.  If you are age 44 or older, your body mass index should be between 23-30. Your Body mass index is 21.19 kg/m. If this is out of the aforementioned range listed, please consider follow up with your Primary Care Provider.  Due to recent changes in healthcare laws, you may see the results of your imaging and laboratory studies on MyChart before your provider has had a chance to review them.  We understand that in some cases there may be results that are confusing or concerning to you. Not all laboratory results come back in the same time frame and the provider may be waiting for multiple results in order to interpret others.  Please give [place] 48 hours in order for your provider to thoroughly review all the results before contacting the office for clarification of your results.

## 2021-02-16 NOTE — Telephone Encounter (Signed)
I have spoken to patient to advise him of Dr Vena Rua recommendation to try creon 36,000 units, 2 capsules with meals, 1 with snacks x 1 week to see if this helps with his diarrhea, gas and bloating. He is to hold loperamide while on the creon and we will continue previous work up as scheduled. Patient verbalizes understanding and indicates he will come tomorrow to pick up samples.

## 2021-02-28 ENCOUNTER — Other Ambulatory Visit: Payer: Self-pay | Admitting: Cardiology

## 2021-03-02 NOTE — Telephone Encounter (Signed)
Olmesartan 40 mg # 90 x 3 refills and Digoxin 0.125 mg # 90 x 3 refills sent to pharmacy

## 2021-03-02 NOTE — Telephone Encounter (Signed)
31m, 66kg, scr 1.13 11/13/20, ccr 43.8, lovw/krasowski 09/24/20. Pt requestig refill of xarelto 20mg  when they only qualify for 15mg  will route to pharmd pool

## 2021-03-02 NOTE — Telephone Encounter (Signed)
Pt states he started on creon about a week 1/2 ago and it has not helped. Reports he has noticed no change. He finished the creon on Friday and wants to know if he is just supposed to resume the Imodium or if Dr. Hilarie Fredrickson has other recommendations. Please advise.

## 2021-03-02 NOTE — Telephone Encounter (Signed)
Patient called in. States he have been taking the Creon and it is not helping with diarrhea and gas. Wants to know what else can he take to help. Best contact number [phone]

## 2021-03-02 NOTE — Telephone Encounter (Signed)
SCr was 0.92 on 11/13/20, CrCl is 54. Pt is on correct dose of Xarelto 20mg  daily, ok to refill

## 2021-03-04 MED ORDER — DIPHENOXYLATE-ATROPINE 2.5-0.025 MG PO TABS: 1.0000 | ORAL_TABLET | Freq: Four times a day (QID) | ORAL | 1 refills | Status: DC | PRN

## 2021-03-04 NOTE — Telephone Encounter (Signed)
Pt aware and script sent to pharmacy. 

## 2021-03-04 NOTE — Addendum Note (Signed)
Addended by: Rosanne Sack R on: 03/04/2021 01:58 PM   Modules accepted: Orders

## 2021-03-04 NOTE — Telephone Encounter (Signed)
Lets try lomotil 1 tab QIDPRN for the diarrhea Virtual colon is scheduled for 5/17 and thereafter we can decide how to proceed further

## 2021-03-05 ENCOUNTER — Telehealth: Payer: Self-pay | Admitting: Cardiology

## 2021-03-05 DIAGNOSIS — C61 Malignant neoplasm of prostate: Secondary | ICD-10-CM | POA: Diagnosis not present

## 2021-03-05 NOTE — Telephone Encounter (Signed)
Left message for patient to return call.

## 2021-03-05 NOTE — Telephone Encounter (Signed)
Pt c/o medication issue:  1. Name of Medication: Digoxin**  2. How are you currently taking this medication (dosage and times per day)? 1 time a day  3. Are you having a reaction (difficulty breathing--STAT)?not often, get lightleaded sometimg    4. What is your medication issue? uncontralable diarrhea for 5 months. When he last saw his primary doctor, his BP was 82/48- Pt wants to know if he need to be seen before his scheduled appt on 03-26-21?

## 2021-03-06 ENCOUNTER — Telehealth: Payer: Self-pay | Admitting: Cardiology

## 2021-03-06 ENCOUNTER — Other Ambulatory Visit: Payer: Self-pay

## 2021-03-06 ENCOUNTER — Encounter (HOSPITAL_COMMUNITY): Payer: Self-pay

## 2021-03-06 ENCOUNTER — Emergency Department (HOSPITAL_COMMUNITY): Payer: Medicare Other

## 2021-03-06 ENCOUNTER — Inpatient Hospital Stay (HOSPITAL_COMMUNITY)
Admission: EM | Admit: 2021-03-06 | Discharge: 2021-03-12 | DRG: 682 | Disposition: A | Discharge status: Home / Self Care | Payer: Medicare Other | Attending: Internal Medicine | Admitting: Internal Medicine

## 2021-03-06 DIAGNOSIS — E860 Dehydration: Secondary | ICD-10-CM | POA: Diagnosis not present

## 2021-03-06 DIAGNOSIS — R634 Abnormal weight loss: Secondary | ICD-10-CM | POA: Diagnosis present

## 2021-03-06 DIAGNOSIS — C61 Malignant neoplasm of prostate: Secondary | ICD-10-CM | POA: Diagnosis not present

## 2021-03-06 DIAGNOSIS — I351 Nonrheumatic aortic (valve) insufficiency: Secondary | ICD-10-CM | POA: Diagnosis not present

## 2021-03-06 DIAGNOSIS — I10 Essential (primary) hypertension: Secondary | ICD-10-CM | POA: Diagnosis not present

## 2021-03-06 DIAGNOSIS — Z6820 Body mass index (BMI) 20.0-20.9, adult: Secondary | ICD-10-CM

## 2021-03-06 DIAGNOSIS — E872 Acidosis, unspecified: Secondary | ICD-10-CM | POA: Diagnosis present

## 2021-03-06 DIAGNOSIS — I959 Hypotension, unspecified: Secondary | ICD-10-CM | POA: Diagnosis not present

## 2021-03-06 DIAGNOSIS — Y842 Radiological procedure and radiotherapy as the cause of abnormal reaction of the patient, or of later complication, without mention of misadventure at the time of the procedure: Secondary | ICD-10-CM | POA: Diagnosis present

## 2021-03-06 DIAGNOSIS — E876 Hypokalemia: Secondary | ICD-10-CM | POA: Diagnosis present

## 2021-03-06 DIAGNOSIS — R197 Diarrhea, unspecified: Secondary | ICD-10-CM | POA: Diagnosis present

## 2021-03-06 DIAGNOSIS — E782 Mixed hyperlipidemia: Secondary | ICD-10-CM | POA: Diagnosis not present

## 2021-03-06 DIAGNOSIS — R0602 Shortness of breath: Secondary | ICD-10-CM | POA: Diagnosis not present

## 2021-03-06 DIAGNOSIS — Z20822 Contact with and (suspected) exposure to covid-19: Secondary | ICD-10-CM | POA: Diagnosis present

## 2021-03-06 DIAGNOSIS — I2510 Atherosclerotic heart disease of native coronary artery without angina pectoris: Secondary | ICD-10-CM | POA: Diagnosis present

## 2021-03-06 DIAGNOSIS — I482 Chronic atrial fibrillation, unspecified: Secondary | ICD-10-CM | POA: Diagnosis present

## 2021-03-06 DIAGNOSIS — D6959 Other secondary thrombocytopenia: Secondary | ICD-10-CM | POA: Diagnosis present

## 2021-03-06 DIAGNOSIS — R29818 Other symptoms and signs involving the nervous system: Secondary | ICD-10-CM | POA: Diagnosis not present

## 2021-03-06 DIAGNOSIS — Z800 Family history of malignant neoplasm of digestive organs: Secondary | ICD-10-CM

## 2021-03-06 DIAGNOSIS — D649 Anemia, unspecified: Secondary | ICD-10-CM | POA: Diagnosis not present

## 2021-03-06 DIAGNOSIS — E1149 Type 2 diabetes mellitus with other diabetic neurological complication: Secondary | ICD-10-CM | POA: Diagnosis present

## 2021-03-06 DIAGNOSIS — D638 Anemia in other chronic diseases classified elsewhere: Secondary | ICD-10-CM | POA: Diagnosis present

## 2021-03-06 DIAGNOSIS — R571 Hypovolemic shock: Secondary | ICD-10-CM | POA: Diagnosis present

## 2021-03-06 DIAGNOSIS — N179 Acute kidney failure, unspecified: Secondary | ICD-10-CM | POA: Diagnosis not present

## 2021-03-06 DIAGNOSIS — Z7901 Long term (current) use of anticoagulants: Secondary | ICD-10-CM

## 2021-03-06 DIAGNOSIS — Z803 Family history of malignant neoplasm of breast: Secondary | ICD-10-CM

## 2021-03-06 DIAGNOSIS — Z8249 Family history of ischemic heart disease and other diseases of the circulatory system: Secondary | ICD-10-CM

## 2021-03-06 DIAGNOSIS — K520 Gastroenteritis and colitis due to radiation: Secondary | ICD-10-CM | POA: Diagnosis not present

## 2021-03-06 DIAGNOSIS — E874 Mixed disorder of acid-base balance: Secondary | ICD-10-CM | POA: Diagnosis not present

## 2021-03-06 DIAGNOSIS — K627 Radiation proctitis: Secondary | ICD-10-CM | POA: Diagnosis present

## 2021-03-06 DIAGNOSIS — R4701 Aphasia: Secondary | ICD-10-CM | POA: Diagnosis not present

## 2021-03-06 DIAGNOSIS — R4781 Slurred speech: Secondary | ICD-10-CM | POA: Diagnosis not present

## 2021-03-06 DIAGNOSIS — L8990 Pressure ulcer of unspecified site, unspecified stage: Secondary | ICD-10-CM | POA: Insufficient documentation

## 2021-03-06 DIAGNOSIS — R410 Disorientation, unspecified: Secondary | ICD-10-CM | POA: Diagnosis not present

## 2021-03-06 DIAGNOSIS — I4891 Unspecified atrial fibrillation: Secondary | ICD-10-CM | POA: Diagnosis not present

## 2021-03-06 DIAGNOSIS — Z87891 Personal history of nicotine dependence: Secondary | ICD-10-CM

## 2021-03-06 DIAGNOSIS — D509 Iron deficiency anemia, unspecified: Secondary | ICD-10-CM | POA: Diagnosis not present

## 2021-03-06 DIAGNOSIS — E861 Hypovolemia: Secondary | ICD-10-CM | POA: Diagnosis not present

## 2021-03-06 DIAGNOSIS — R29810 Facial weakness: Secondary | ICD-10-CM | POA: Diagnosis not present

## 2021-03-06 DIAGNOSIS — I361 Nonrheumatic tricuspid (valve) insufficiency: Secondary | ICD-10-CM | POA: Diagnosis not present

## 2021-03-06 DIAGNOSIS — E873 Alkalosis: Secondary | ICD-10-CM

## 2021-03-06 DIAGNOSIS — I9589 Other hypotension: Secondary | ICD-10-CM | POA: Diagnosis not present

## 2021-03-06 HISTORY — DX: Acidosis, unspecified: E87.20

## 2021-03-06 HISTORY — DX: Acute kidney failure, unspecified: N17.9

## 2021-03-06 HISTORY — DX: Acidosis: E87.2

## 2021-03-06 HISTORY — DX: Dehydration: E86.0

## 2021-03-06 HISTORY — DX: Hypovolemic shock: R57.1

## 2021-03-06 HISTORY — DX: Anemia, unspecified: D64.9

## 2021-03-06 HISTORY — DX: Hypomagnesemia: E83.42

## 2021-03-06 HISTORY — DX: Diarrhea, unspecified: R19.7

## 2021-03-06 LAB — RESP PANEL BY RT-PCR (FLU A&B, COVID) ARPGX2
Influenza A by PCR: NEGATIVE
Influenza B by PCR: NEGATIVE
SARS Coronavirus 2 by RT PCR: NEGATIVE

## 2021-03-06 LAB — LACTIC ACID, PLASMA
Lactic Acid, Venous: 2.2 mmol/L (ref 0.5–1.9)
Lactic Acid, Venous: 1.5 mmol/L (ref 0.5–1.9)

## 2021-03-06 LAB — CBC
HCT: 31.5 % — ABNORMAL LOW (ref 39.0–52.0)
HCT: 28.2 % — ABNORMAL LOW (ref 39.0–52.0)
Hemoglobin: 10.6 g/dL — ABNORMAL LOW (ref 13.0–17.0)
Hemoglobin: 9.5 g/dL — ABNORMAL LOW (ref 13.0–17.0)
MCH: 32.7 pg (ref 26.0–34.0)
MCH: 32.6 pg (ref 26.0–34.0)
MCHC: 33.7 g/dL (ref 30.0–36.0)
MCHC: 33.7 g/dL (ref 30.0–36.0)
MCV: 97.2 fL (ref 80.0–100.0)
MCV: 96.9 fL (ref 80.0–100.0)
Platelets: 164 10*3/uL (ref 150–400)
Platelets: 149 10*3/uL — ABNORMAL LOW (ref 150–400)
RBC: 3.24 MIL/uL — ABNORMAL LOW (ref 4.22–5.81)
RBC: 2.91 MIL/uL — ABNORMAL LOW (ref 4.22–5.81)
RDW: 14.3 % (ref 11.5–15.5)
RDW: 14 % (ref 11.5–15.5)
WBC: 6.5 10*3/uL (ref 4.0–10.5)
WBC: 5 10*3/uL (ref 4.0–10.5)
nRBC: 0 % (ref 0.0–0.2)
nRBC: 0 % (ref 0.0–0.2)

## 2021-03-06 LAB — COMPREHENSIVE METABOLIC PANEL
ALT: 22 U/L (ref 0–44)
AST: 20 U/L (ref 15–41)
Albumin: 3 g/dL — ABNORMAL LOW (ref 3.5–5.0)
Alkaline Phosphatase: 47 U/L (ref 38–126)
Anion gap: 9 (ref 5–15)
BUN: 71 mg/dL — ABNORMAL HIGH (ref 8–23)
CO2: 15 mmol/L — ABNORMAL LOW (ref 22–32)
Calcium: 6.6 mg/dL — ABNORMAL LOW (ref 8.9–10.3)
Chloride: 113 mmol/L — ABNORMAL HIGH (ref 98–111)
Creatinine, Ser: 1.87 mg/dL — ABNORMAL HIGH (ref 0.61–1.24)
GFR, Estimated: 35 mL/min — ABNORMAL LOW (ref >60)
Glucose, Bld: 112 mg/dL — ABNORMAL HIGH (ref 70–99)
Potassium: 4.4 mmol/L (ref 3.5–5.1)
Sodium: 137 mmol/L (ref 135–145)
Total Bilirubin: 0.8 mg/dL (ref 0.3–1.2)
Total Protein: 5.9 g/dL — ABNORMAL LOW (ref 6.5–8.1)

## 2021-03-06 LAB — I-STAT CHEM 8, ED
BUN: 68 mg/dL — ABNORMAL HIGH (ref 8–23)
Calcium, Ion: 0.83 mmol/L — CL (ref 1.15–1.40)
Chloride: 111 mmol/L (ref 98–111)
Creatinine, Ser: 1.8 mg/dL — ABNORMAL HIGH (ref 0.61–1.24)
Glucose, Bld: 110 mg/dL — ABNORMAL HIGH (ref 70–99)
HCT: 32 % — ABNORMAL LOW (ref 39.0–52.0)
Hemoglobin: 10.9 g/dL — ABNORMAL LOW (ref 13.0–17.0)
Potassium: 4.4 mmol/L (ref 3.5–5.1)
Sodium: 141 mmol/L (ref 135–145)
TCO2: 13 mmol/L — ABNORMAL LOW (ref 22–32)

## 2021-03-06 LAB — DIFFERENTIAL
Abs Immature Granulocytes: 0.02 10*3/uL (ref 0.00–0.07)
Basophils Absolute: 0 10*3/uL (ref 0.0–0.1)
Basophils Relative: 0 %
Eosinophils Absolute: 0.1 10*3/uL (ref 0.0–0.5)
Eosinophils Relative: 2 %
Immature Granulocytes: 0 %
Lymphocytes Relative: 16 %
Lymphs Abs: 1 10*3/uL (ref 0.7–4.0)
Monocytes Absolute: 1.1 10*3/uL — ABNORMAL HIGH (ref 0.1–1.0)
Monocytes Relative: 17 %
Neutro Abs: 4.3 10*3/uL (ref 1.7–7.7)
Neutrophils Relative %: 65 %

## 2021-03-06 LAB — BASIC METABOLIC PANEL
Anion gap: 7 (ref 5–15)
BUN: 65 mg/dL — ABNORMAL HIGH (ref 8–23)
CO2: 14 mmol/L — ABNORMAL LOW (ref 22–32)
Calcium: 6.2 mg/dL — CL (ref 8.9–10.3)
Chloride: 115 mmol/L — ABNORMAL HIGH (ref 98–111)
Creatinine, Ser: 1.53 mg/dL — ABNORMAL HIGH (ref 0.61–1.24)
GFR, Estimated: 44 mL/min — ABNORMAL LOW (ref >60)
Glucose, Bld: 93 mg/dL (ref 70–99)
Potassium: 4 mmol/L (ref 3.5–5.1)
Sodium: 136 mmol/L (ref 135–145)

## 2021-03-06 LAB — PHOSPHORUS
Phosphorus: 3.3 mg/dL (ref 2.5–4.6)
Phosphorus: 2.9 mg/dL (ref 2.5–4.6)

## 2021-03-06 LAB — BLOOD GAS, ARTERIAL
Acid-base deficit: 13.3 mmol/L — ABNORMAL HIGH (ref 0.0–2.0)
Bicarbonate: 9.1 mmol/L — ABNORMAL LOW (ref 20.0–28.0)
Drawn by: 516122
FIO2: 21
O2 Saturation: 99.1 %
Patient temperature: 37
pCO2 arterial: <19 mmHg — CL (ref 32.0–48.0)
pH, Arterial: 7.571 — ABNORMAL HIGH (ref 7.350–7.450)
pO2, Arterial: 166 mmHg — ABNORMAL HIGH (ref 83.0–108.0)

## 2021-03-06 LAB — MAGNESIUM
Magnesium: 0.6 mg/dL — CL (ref 1.7–2.4)
Magnesium: 1.1 mg/dL — ABNORMAL LOW (ref 1.7–2.4)
Magnesium: 1.7 mg/dL (ref 1.7–2.4)

## 2021-03-06 LAB — TROPONIN I (HIGH SENSITIVITY)
Troponin I (High Sensitivity): 16 ng/L (ref <18)
Troponin I (High Sensitivity): 19 ng/L — ABNORMAL HIGH (ref <18)

## 2021-03-06 LAB — BRAIN NATRIURETIC PEPTIDE: B Natriuretic Peptide: 107.6 pg/mL — ABNORMAL HIGH (ref 0.0–100.0)

## 2021-03-06 LAB — DIGOXIN LEVEL
Digoxin Level: <0.2 ng/mL — ABNORMAL LOW (ref 0.8–2.0)
Digoxin Level: 0.3 ng/mL — ABNORMAL LOW (ref 0.8–2.0)

## 2021-03-06 LAB — CBG MONITORING, ED: Glucose-Capillary: 108 mg/dL — ABNORMAL HIGH (ref 70–99)

## 2021-03-06 LAB — APTT: aPTT: 38 seconds — ABNORMAL HIGH (ref 24–36)

## 2021-03-06 LAB — SALICYLATE LEVEL: Salicylate Lvl: <7 mg/dL — ABNORMAL LOW (ref 7.0–30.0)

## 2021-03-06 LAB — PROTIME-INR
INR: 2.1 — ABNORMAL HIGH (ref 0.8–1.2)
Prothrombin Time: 23.1 seconds — ABNORMAL HIGH (ref 11.4–15.2)

## 2021-03-06 LAB — AMMONIA: Ammonia: 20 umol/L (ref 9–35)

## 2021-03-06 MED ORDER — SODIUM CHLORIDE 0.9 % IV SOLN
4.0000 g | Freq: Once | INTRAVENOUS | Status: AC
  Administered 2021-03-06: 4 g via INTRAVENOUS
  Filled 2021-03-06: qty 40

## 2021-03-06 MED ORDER — MAGNESIUM SULFATE 2 GM/50ML IV SOLN
2.0000 g | Freq: Once | INTRAVENOUS | Status: AC
  Administered 2021-03-06: 2 g via INTRAVENOUS
  Filled 2021-03-06: qty 50

## 2021-03-06 MED ORDER — SODIUM CHLORIDE 0.9% FLUSH
3.0000 mL | Freq: Once | INTRAVENOUS | Status: AC
  Administered 2021-03-06: 3 mL via INTRAVENOUS

## 2021-03-06 MED ORDER — SODIUM CHLORIDE 0.9 % IV BOLUS
1000.0000 mL | Freq: Once | INTRAVENOUS | Status: AC
  Administered 2021-03-06: 1000 mL via INTRAVENOUS

## 2021-03-06 MED ORDER — DIGOXIN 0.25 MG/ML IJ SOLN
0.1250 mg | Freq: Every day | INTRAMUSCULAR | Status: DC
  Administered 2021-03-07: 0.125 mg via INTRAVENOUS
  Filled 2021-03-06 (×2): qty 2

## 2021-03-06 MED ORDER — RIVAROXABAN 20 MG PO TABS: 20.0000 mg | ORAL_TABLET | Freq: Every day | ORAL | Status: DC

## 2021-03-06 MED ORDER — DIPHENOXYLATE-ATROPINE 2.5-0.025 MG PO TABS: 1.0000 | ORAL_TABLET | Freq: Four times a day (QID) | ORAL | Status: DC | PRN

## 2021-03-06 MED ORDER — RIVAROXABAN 20 MG PO TABS
20.0000 mg | ORAL_TABLET | Freq: Every day | ORAL | Status: DC
  Administered 2021-03-07 – 2021-03-11 (×5): 20 mg via ORAL
  Filled 2021-03-06 (×5): qty 1

## 2021-03-06 MED ORDER — TAMSULOSIN HCL 0.4 MG PO CAPS
0.4000 mg | ORAL_CAPSULE | Freq: Every day | ORAL | Status: DC
  Administered 2021-03-07 – 2021-03-12 (×6): 0.4 mg via ORAL
  Filled 2021-03-06 (×6): qty 1

## 2021-03-06 MED ORDER — ONDANSETRON HCL 4 MG/2ML IJ SOLN
4.0000 mg | Freq: Four times a day (QID) | INTRAMUSCULAR | Status: DC | PRN
Start: 2021-03-06 — End: 2021-03-12
  Administered 2021-03-08: 4 mg via INTRAVENOUS
  Filled 2021-03-06: qty 2

## 2021-03-06 MED ORDER — FINASTERIDE 5 MG PO TABS
5.0000 mg | ORAL_TABLET | Freq: Every day | ORAL | Status: DC
  Administered 2021-03-07 – 2021-03-11 (×5): 5 mg via ORAL
  Filled 2021-03-06 (×6): qty 1

## 2021-03-06 MED ORDER — ATORVASTATIN CALCIUM 10 MG PO TABS
10.0000 mg | ORAL_TABLET | Freq: Every day | ORAL | Status: DC
  Administered 2021-03-07 – 2021-03-11 (×5): 10 mg via ORAL
  Filled 2021-03-06 (×5): qty 1

## 2021-03-06 MED ORDER — MAGNESIUM SULFATE 2 GM/50ML IV SOLN
2.0000 g | Freq: Once | INTRAVENOUS | Status: AC
  Administered 2021-03-07: 2 g via INTRAVENOUS
  Filled 2021-03-06: qty 50

## 2021-03-06 MED ORDER — FAMOTIDINE IN NACL 20-0.9 MG/50ML-% IV SOLN
20.0000 mg | INTRAVENOUS | Status: DC
  Administered 2021-03-07 (×2): 20 mg via INTRAVENOUS
  Filled 2021-03-06 (×2): qty 50

## 2021-03-06 MED ORDER — SODIUM CHLORIDE 0.9 % IV SOLN: INTRAVENOUS | Status: DC
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
## 2021-03-06 NOTE — Telephone Encounter (Signed)
Called Ryan Cochran back informed her that the patient yes patient is still supposed to take atorvastatin 10 mg daily.

## 2021-03-06 NOTE — Code Documentation (Signed)
Stroke Response Nurse Documentation Code Documentation  Ryan Dise. is a 87 y.o. male arriving to [facility] ED via [facility] on 03/06/2021 with past medical hx of AF, CAD, DM,BPH, prostate CA . Code stroke was activated by EMS. Patient from [facility] where he was LKW at 67 and now complaining of aphasia . On Xarelto (rivaroxaban) daily. Stroke team at the bedside on patient arrival. Labs drawn and patient cleared for CT by Dr. Colin Broach. Patient to CT with team. NIHSS 2, see documentation for details and code stroke times. Patient with disoriented and Expressive aphasia  on exam. The following imaging was completed:  CT. Patient is not a candidate for tPA due to xarelto  Patient with tachypnea, and cyanotic nail beds.  O2 sats 100% on RA.  BP initially 120/65  While in CT BP dropped to 74/54  HR 74.  NS bolus started.  Returned to ED for further work up   [employer]:  Neuro checks and VS q 2 hours. Bedside handoff with ED RN Ryan Cochran.    Raliegh Ip  Stroke Response RN

## 2021-03-06 NOTE — Telephone Encounter (Signed)
Left message for patient to return call.

## 2021-03-06 NOTE — Progress Notes (Signed)
ABG obtained. Sample has problems running on ISTAT. Sample was sent down to lab. Lab confirmed results with RT. Results in chart.

## 2021-03-06 NOTE — ED Triage Notes (Signed)
[facility] from [facility] with ems, call was initially for SOB. Pt hands were cyanotic, unable to get an SPO2, placed on 4L [place] by EMS. When they got pt in the truck he started having expressive aphasia, no weakness or facial droop. Code stroke called. Pt takes xarelto for a fib.   EMS vitals: 124/74 HR80s a fib RR 20-30 CBG 106
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
## 2021-03-06 NOTE — Telephone Encounter (Signed)
New message:    Juliann Pulse from [facility] calling to ask a question concerning Atorvastatin. [phone].

## 2021-03-07 ENCOUNTER — Inpatient Hospital Stay (HOSPITAL_COMMUNITY): Payer: Medicare Other

## 2021-03-07 DIAGNOSIS — I482 Chronic atrial fibrillation, unspecified: Secondary | ICD-10-CM

## 2021-03-07 DIAGNOSIS — I9589 Other hypotension: Secondary | ICD-10-CM

## 2021-03-07 DIAGNOSIS — R4701 Aphasia: Secondary | ICD-10-CM

## 2021-03-07 DIAGNOSIS — E861 Hypovolemia: Secondary | ICD-10-CM

## 2021-03-07 DIAGNOSIS — E876 Hypokalemia: Secondary | ICD-10-CM

## 2021-03-07 DIAGNOSIS — N179 Acute kidney failure, unspecified: Secondary | ICD-10-CM | POA: Diagnosis not present

## 2021-03-07 HISTORY — DX: Hypocalcemia: E83.51

## 2021-03-07 LAB — CBC
HCT: 27.6 % — ABNORMAL LOW (ref 39.0–52.0)
HCT: 28 % — ABNORMAL LOW (ref 39.0–52.0)
HCT: 28.6 % — ABNORMAL LOW (ref 39.0–52.0)
Hemoglobin: 9.2 g/dL — ABNORMAL LOW (ref 13.0–17.0)
Hemoglobin: 9.6 g/dL — ABNORMAL LOW (ref 13.0–17.0)
Hemoglobin: 8.8 g/dL — ABNORMAL LOW (ref 13.0–17.0)
MCH: 32.6 pg (ref 26.0–34.0)
MCH: 32.9 pg (ref 26.0–34.0)
MCH: 32.8 pg (ref 26.0–34.0)
MCHC: 33.3 g/dL (ref 30.0–36.0)
MCHC: 34.3 g/dL (ref 30.0–36.0)
MCHC: 30.8 g/dL (ref 30.0–36.0)
MCV: 97.9 fL (ref 80.0–100.0)
MCV: 95.9 fL (ref 80.0–100.0)
MCV: 106.7 fL — ABNORMAL HIGH (ref 80.0–100.0)
Platelets: 140 10*3/uL — ABNORMAL LOW (ref 150–400)
Platelets: 131 10*3/uL — ABNORMAL LOW (ref 150–400)
Platelets: 123 10*3/uL — ABNORMAL LOW (ref 150–400)
RBC: 2.82 MIL/uL — ABNORMAL LOW (ref 4.22–5.81)
RBC: 2.92 MIL/uL — ABNORMAL LOW (ref 4.22–5.81)
RBC: 2.68 MIL/uL — ABNORMAL LOW (ref 4.22–5.81)
RDW: 13.9 % (ref 11.5–15.5)
RDW: 14 % (ref 11.5–15.5)
RDW: 14.5 % (ref 11.5–15.5)
WBC: 3.9 10*3/uL — ABNORMAL LOW (ref 4.0–10.5)
WBC: 3.8 10*3/uL — ABNORMAL LOW (ref 4.0–10.5)
WBC: 3.6 10*3/uL — ABNORMAL LOW (ref 4.0–10.5)
nRBC: 0 % (ref 0.0–0.2)
nRBC: 0 % (ref 0.0–0.2)
nRBC: 0 % (ref 0.0–0.2)

## 2021-03-07 LAB — BASIC METABOLIC PANEL
Anion gap: 7 (ref 5–15)
Anion gap: 3 — ABNORMAL LOW (ref 5–15)
BUN: 54 mg/dL — ABNORMAL HIGH (ref 8–23)
BUN: 43 mg/dL — ABNORMAL HIGH (ref 8–23)
CO2: 16 mmol/L — ABNORMAL LOW (ref 22–32)
CO2: 19 mmol/L — ABNORMAL LOW (ref 22–32)
Calcium: 7.4 mg/dL — ABNORMAL LOW (ref 8.9–10.3)
Calcium: 7.5 mg/dL — ABNORMAL LOW (ref 8.9–10.3)
Chloride: 113 mmol/L — ABNORMAL HIGH (ref 98–111)
Chloride: 116 mmol/L — ABNORMAL HIGH (ref 98–111)
Creatinine, Ser: 1.21 mg/dL (ref 0.61–1.24)
Creatinine, Ser: 1.11 mg/dL (ref 0.61–1.24)
GFR, Estimated: 58 mL/min — ABNORMAL LOW (ref >60)
GFR, Estimated: >60 mL/min (ref >60)
Glucose, Bld: 97 mg/dL (ref 70–99)
Glucose, Bld: 120 mg/dL — ABNORMAL HIGH (ref 70–99)
Potassium: 4 mmol/L (ref 3.5–5.1)
Potassium: 4 mmol/L (ref 3.5–5.1)
Sodium: 136 mmol/L (ref 135–145)
Sodium: 138 mmol/L (ref 135–145)

## 2021-03-07 LAB — I-STAT ARTERIAL BLOOD GAS, ED
Acid-base deficit: 11 mmol/L — ABNORMAL HIGH (ref 0.0–2.0)
Bicarbonate: 11.9 mmol/L — ABNORMAL LOW (ref 20.0–28.0)
Calcium, Ion: 1.14 mmol/L — ABNORMAL LOW (ref 1.15–1.40)
HCT: 24 % — ABNORMAL LOW (ref 39.0–52.0)
Hemoglobin: 8.2 g/dL — ABNORMAL LOW (ref 13.0–17.0)
O2 Saturation: 98 %
Patient temperature: 97.9
Potassium: 4.1 mmol/L (ref 3.5–5.1)
Sodium: 140 mmol/L (ref 135–145)
TCO2: 12 mmol/L — ABNORMAL LOW (ref 22–32)
pCO2 arterial: 18.2 mmHg — CL (ref 32.0–48.0)
pH, Arterial: 7.423 (ref 7.350–7.450)
pO2, Arterial: 98 mmHg (ref 83.0–108.0)

## 2021-03-07 LAB — PHOSPHORUS: Phosphorus: 3 mg/dL (ref 2.5–4.6)

## 2021-03-07 LAB — MAGNESIUM
Magnesium: 2 mg/dL (ref 1.7–2.4)
Magnesium: 2 mg/dL (ref 1.7–2.4)
Magnesium: 1.8 mg/dL (ref 1.7–2.4)

## 2021-03-07 LAB — GLUCOSE, CAPILLARY
Glucose-Capillary: 109 mg/dL — ABNORMAL HIGH (ref 70–99)
Glucose-Capillary: 124 mg/dL — ABNORMAL HIGH (ref 70–99)
Glucose-Capillary: 77 mg/dL (ref 70–99)
Glucose-Capillary: 87 mg/dL (ref 70–99)
Glucose-Capillary: 89 mg/dL (ref 70–99)

## 2021-03-07 LAB — MRSA PCR SCREENING: MRSA by PCR: NEGATIVE

## 2021-03-07 LAB — C DIFFICILE QUICK SCREEN W PCR REFLEX
C Diff antigen: NEGATIVE
C Diff interpretation: NOT DETECTED
C Diff toxin: NEGATIVE

## 2021-03-07 LAB — LACTIC ACID, PLASMA: Lactic Acid, Venous: 1.2 mmol/L (ref 0.5–1.9)

## 2021-03-07 LAB — CBG MONITORING, ED: Glucose-Capillary: 92 mg/dL (ref 70–99)

## 2021-03-07 MED ORDER — STERILE WATER FOR INJECTION IV SOLN
INTRAVENOUS | Status: DC
  Filled 2021-03-07: qty 0

## 2021-03-07 MED ORDER — MAGNESIUM SULFATE 2 GM/50ML IV SOLN
2.0000 g | Freq: Once | INTRAVENOUS | Status: AC
  Administered 2021-03-07: 2 g via INTRAVENOUS
  Filled 2021-03-07: qty 50

## 2021-03-07 MED ORDER — POTASSIUM CHLORIDE CRYS ER 20 MEQ PO TBCR
20.0000 meq | EXTENDED_RELEASE_TABLET | Freq: Every day | ORAL | Status: DC
  Administered 2021-03-07 – 2021-03-11 (×5): 20 meq via ORAL
  Filled 2021-03-07 (×5): qty 1

## 2021-03-07 MED ORDER — ENSURE ENLIVE PO LIQD
237.0000 mL | Freq: Two times a day (BID) | ORAL | Status: DC
  Administered 2021-03-07 – 2021-03-09 (×4): 237 mL via ORAL

## 2021-03-07 MED ORDER — CALCIUM GLUCONATE-NACL 1-0.675 GM/50ML-% IV SOLN
1.0000 g | Freq: Once | INTRAVENOUS | Status: AC
  Administered 2021-03-07: 1000 mg via INTRAVENOUS
  Filled 2021-03-07: qty 50

## 2021-03-07 MED ORDER — LACTATED RINGERS IV SOLN: INTRAVENOUS | Status: DC

## 2021-03-07 MED ORDER — DIGOXIN 125 MCG PO TABS
0.1250 mg | ORAL_TABLET | Freq: Every day | ORAL | Status: DC
  Administered 2021-03-07 – 2021-03-12 (×6): 0.125 mg via ORAL
  Filled 2021-03-07 (×6): qty 1

## 2021-03-07 MED ORDER — MIDODRINE HCL 5 MG PO TABS
5.0000 mg | ORAL_TABLET | Freq: Three times a day (TID) | ORAL | Status: DC
  Administered 2021-03-07 – 2021-03-09 (×5): 5 mg via ORAL
  Filled 2021-03-07 (×5): qty 1

## 2021-03-07 MED ORDER — CHLORHEXIDINE GLUCONATE CLOTH 2 % EX PADS
6.0000 | MEDICATED_PAD | Freq: Every day | CUTANEOUS | Status: DC
  Administered 2021-03-07 – 2021-03-12 (×6): 6 via TOPICAL

## 2021-03-07 NOTE — Progress Notes (Signed)
Carotid artery duplex completed. Refer to "CV Proc" under chart review to view preliminary results.  03/07/2021 3:51 PM Kelby Aline., MHA, RVT, RDCS, RDMS

## 2021-03-07 NOTE — Progress Notes (Signed)
Patient having frequent dizzy spells at home and has had some falls (or assisted himself to the floor) because of it

## 2021-03-07 NOTE — Progress Notes (Signed)
STROKE TEAM PROGRESS NOTE   INTERVAL HISTORY: 87 y.o. Caucasian male with PMH of A. fib on Xarelto, diabetes, prostate cancer living in assisted living facility presented to ED for code stroke.  Per daughter and son-in-law, patient lives in assisted living facility but pretty functional, talking walking and takes medication by himself EMS received a phone call from facility on day of admission stating that patient has SOB.  On arrival, patient was found to have cyanosis, tachypnea but awake alert and conversant.,  While on the stretcher loading up into the EMS truck, patient was found to have acute onset expressive aphasia, gibberish words, however this seems comes and goes.  No arm or leg weakness or facial droop.  BP 120/80, glucose 106.  Code stroke activated.  In bridge, patient was seen with spontaneous speech, but with difficulty naming and repeating and answers questions with word salad but still intermittent.  No other focal deficit.  CT no acute abnormality, but small hypodensity right lateral middle cerebral peduncle likely indeterminate age.  Initial NIHSS 3. xarelto on hold    No one is at the bedside.     Vitals:   03/07/21 0900 03/07/21 1000 03/07/21 1133 03/07/21 1200  BP: 116/66 (!) 102/49  (!) 94/50  Pulse: 69 65  72  Resp: (!) 25 17  17   Temp:   98.3 F (36.8 C)   TempSrc:   Oral   SpO2: 91% 96%    Weight:      Height:       CBC:  Recent Labs  Lab 03/06/21 1457 03/06/21 2113 03/07/21 0500 03/07/21 0832  WBC 6.5   < > 3.9* 3.8*  NEUTROABS 4.3  --   --   --   HGB 10.6*   < > 9.2* 9.6*  HCT 31.5*   < > 27.6* 28.0*  MCV 97.2   < > 97.9 95.9  PLT 164   < > 140* 131*   < > = values in this interval not displayed.   Basic Metabolic Panel:  Recent Labs  Lab 03/06/21 2113 03/06/21 2215 03/07/21 0238 03/07/21 0421 03/07/21 0500  NA 136  --   --  140 136  K 4.0  --   --  4.1 4.0  CL 115*  --   --   --  113*  CO2 14*  --   --   --  16*  GLUCOSE 93  --   --    --  97  BUN 65*  --   --   --  54*  CREATININE 1.53*  --   --   --  1.21  CALCIUM 6.2*  --   --   --  7.4*  MG 1.1* 1.7 2.0  --  2.0  PHOS 3.3 2.9  --   --   --     Lipid Panel: No results for input(s): CHOL, TRIG, HDL, CHOLHDL, VLDL, LDLCALC in the last 168 hours.    HgbA1c: No results for input(s): HGBA1C in the last 168 hours. Urine Drug Screen: No results for input(s): LABOPIA, COCAINSCRNUR, LABBENZ, AMPHETMU, THCU, LABBARB in the last 168 hours.  Alcohol Level No results for input(s): ETH in the last 168 hours.  IMAGING past 24 hours DG Chest Port 1 View  Result Date: 03/06/2021 CLINICAL DATA:  Shortness of breath EXAM: PORTABLE CHEST 1 VIEW COMPARISON:  None. FINDINGS: The heart size and mediastinal contours are within normal limits. Both lungs are clear. No pleural effusion or  pneumothorax. The visualized skeletal structures are unremarkable. IMPRESSION: No acute process in the chest. Electronically Signed   By: Macy Mis M.D.   On: 03/06/2021 15:53   CT HEAD CODE STROKE WO CONTRAST  Result Date: 03/06/2021 CLINICAL DATA:  Code stroke.  Acute neuro deficit. EXAM: CT HEAD WITHOUT CONTRAST TECHNIQUE: Contiguous axial images were obtained from the base of the skull through the vertex without intravenous contrast. COMPARISON:  None. FINDINGS: Brain: Mild atrophy. Negative for hydrocephalus. Negative for hemorrhage or mass. Small hypodensity right middle cerebral peduncle laterally most likely infarct of indeterminate age. Vascular: Negative for hyperdense vessel Skull: Negative Sinuses/Orbits: Mucosal edema paranasal sinuses. Bilateral cataract extraction Other: None ASPECTS ([place] Stroke Program Early CT Score) - Ganglionic level infarction (caudate, lentiform nuclei, internal capsule, insula, M1-M3 cortex): 7 - Supraganglionic infarction (M4-M6 cortex): 3 Total score (0-10 with 10 being normal): 10 IMPRESSION: 1. Small hypodensity right lateral middle cerebral peduncle. Likely infarct  of indeterminate age. 2. No other acute infarct or hemorrhage. 3. ASPECTS is 10 4. Code stroke imaging results were communicated on 03/06/2021 at 3:03 pm to provider Erlinda Hong via text page Electronically Signed   By: Franchot Gallo M.D.   On: 03/06/2021 15:03    PHYSICAL EXAM  General - well nourished, well developed, tachypnea and SIB.    Ophthalmologic - fundi not visualized due to noncooperation.    Cardiovascular - irregularly irregular heart rate and rhythm.  Neuro - awake, alert, eyes open, orientated to place, but word salad with other questions. Following all simple commands.  Intermittent word salad at with naming and repetition. No gaze palsy, tracking bilaterally, blinking to visual threat bilaterally, PERRL. No facial droop. Tongue midline. Bilateral UEs 5/5, no drift. Bilaterally LEs 5/5, no drift. Sensation symmetrical bilaterally, b/l FTN intact grossly, gait not tested.    ASSESSMENT/PLAN [name]. is a 87 y.o. male with history outlined above presenting with  .  acute onset expressive aphasia, gibberish words. CT brain is unremarkable. Patient is back to baseline neurologically. Exam is unremarkable.  Plan to complete stroke workup with carotid doppler, echocardiogram. We will continue to follow .  Stroke   Code Stroke  CT head No acute abnormality.   Atrophy.  ASPECTS 10.      Carotid Doppler  ordered  2D Echo  Ordered      LDL 38  HgbA1c 5.8  VTE prophylaxis -      Diet   Diet Carb Modified Fluid consistency: Thin; Room service appropriate? Yes    Xarelto (rivaroxaban) daily prior to admission, now on Xarelto (rivaroxaban) daily.    Therapy recommendations:  TBD  Disposition:  TBD  Hypertension  Home meds:   benicar   Stable . Permissive hypertension (OK if < 220/120) but gradually normalize in 5-7 days . Long-term BP goal normotensive  Hyperlipidemia  Home meds:  lipitor,  resumed in hospital  LDL 38, goal < 7   Continue statin at  discharge  HgbA1c 5.8, goal < 7.0  CBGs Recent Labs    03/07/21 0052 03/07/21 0804 03/07/21 1131  GLUCAP 92 77 89      SSI  Other Stroke Risk Factors   Advanced Age >/= 61    Hx stroke/TIA   Hospital day # 2    To contact Stroke Continuity provider, please refer to [email or web address] After hours, contact General Neurology

## 2021-03-07 NOTE — Progress Notes (Signed)
[facility] Progress Note Patient Name: Ryan Cochran. DOB: 1933-08-10 MRN: [id number]   Date of Service  03/07/2021  HPI/Events of Note  ABG on room air = 7.423/18.2/98/11.9- ABG c/w compensated metabolic acidosis d/t loss of bicarb in diarrhea.   eICU Interventions  Plan: 1. NaHCO3 IV infusion to run IV at 75 mL/hour. 2. Decrease 0.9 NaCl IV infusion to 50 mL/hour. 3. Repeat ABG at 12 noon.     Intervention Category Major Interventions: Acid-Base disturbance - evaluation and management  Jerald Villalona Eugene 03/07/2021, 4:43 AM

## 2021-03-07 NOTE — Plan of Care (Signed)

## 2021-03-08 ENCOUNTER — Inpatient Hospital Stay (HOSPITAL_COMMUNITY): Payer: Medicare Other

## 2021-03-08 DIAGNOSIS — L8990 Pressure ulcer of unspecified site, unspecified stage: Secondary | ICD-10-CM

## 2021-03-08 DIAGNOSIS — I959 Hypotension, unspecified: Secondary | ICD-10-CM | POA: Diagnosis not present

## 2021-03-08 DIAGNOSIS — R571 Hypovolemic shock: Secondary | ICD-10-CM | POA: Diagnosis not present

## 2021-03-08 DIAGNOSIS — I361 Nonrheumatic tricuspid (valve) insufficiency: Secondary | ICD-10-CM | POA: Diagnosis not present

## 2021-03-08 DIAGNOSIS — I351 Nonrheumatic aortic (valve) insufficiency: Secondary | ICD-10-CM | POA: Diagnosis not present

## 2021-03-08 DIAGNOSIS — I9589 Other hypotension: Secondary | ICD-10-CM

## 2021-03-08 DIAGNOSIS — N179 Acute kidney failure, unspecified: Secondary | ICD-10-CM | POA: Diagnosis not present

## 2021-03-08 HISTORY — DX: Pressure ulcer of unspecified site, unspecified stage: L89.90

## 2021-03-08 LAB — GASTROINTESTINAL PANEL BY PCR, STOOL (REPLACES STOOL CULTURE)

## 2021-03-08 LAB — ECHOCARDIOGRAM COMPLETE
Height: 69 in
S' Lateral: 2.7 cm
Weight: 2391.55 oz

## 2021-03-08 LAB — BASIC METABOLIC PANEL
Anion gap: 8 (ref 5–15)
Anion gap: 5 (ref 5–15)
BUN: 31 mg/dL — ABNORMAL HIGH (ref 8–23)
BUN: 29 mg/dL — ABNORMAL HIGH (ref 8–23)
CO2: 14 mmol/L — ABNORMAL LOW (ref 22–32)
CO2: 21 mmol/L — ABNORMAL LOW (ref 22–32)
Calcium: 7 mg/dL — ABNORMAL LOW (ref 8.9–10.3)
Calcium: 7.3 mg/dL — ABNORMAL LOW (ref 8.9–10.3)
Chloride: 114 mmol/L — ABNORMAL HIGH (ref 98–111)
Chloride: 113 mmol/L — ABNORMAL HIGH (ref 98–111)
Creatinine, Ser: 0.75 mg/dL (ref 0.61–1.24)
Creatinine, Ser: 0.9 mg/dL (ref 0.61–1.24)
GFR, Estimated: >60 mL/min (ref >60)
GFR, Estimated: >60 mL/min (ref >60)
Glucose, Bld: 86 mg/dL (ref 70–99)
Glucose, Bld: 127 mg/dL — ABNORMAL HIGH (ref 70–99)
Potassium: 4 mmol/L (ref 3.5–5.1)
Potassium: 4.2 mmol/L (ref 3.5–5.1)
Sodium: 136 mmol/L (ref 135–145)
Sodium: 139 mmol/L (ref 135–145)

## 2021-03-08 LAB — CBC
HCT: 27.9 % — ABNORMAL LOW (ref 39.0–52.0)
HCT: 27.2 % — ABNORMAL LOW (ref 39.0–52.0)
Hemoglobin: 9.4 g/dL — ABNORMAL LOW (ref 13.0–17.0)
Hemoglobin: 8.9 g/dL — ABNORMAL LOW (ref 13.0–17.0)
MCH: 32.5 pg (ref 26.0–34.0)
MCH: 32.7 pg (ref 26.0–34.0)
MCHC: 33.7 g/dL (ref 30.0–36.0)
MCHC: 32.7 g/dL (ref 30.0–36.0)
MCV: 96.5 fL (ref 80.0–100.0)
MCV: 100 fL (ref 80.0–100.0)
Platelets: 133 10*3/uL — ABNORMAL LOW (ref 150–400)
Platelets: 133 10*3/uL — ABNORMAL LOW (ref 150–400)
RBC: 2.89 MIL/uL — ABNORMAL LOW (ref 4.22–5.81)
RBC: 2.72 MIL/uL — ABNORMAL LOW (ref 4.22–5.81)
RDW: 13.9 % (ref 11.5–15.5)
RDW: 14.1 % (ref 11.5–15.5)
WBC: 4.1 10*3/uL (ref 4.0–10.5)
WBC: 4.6 10*3/uL (ref 4.0–10.5)
nRBC: 0 % (ref 0.0–0.2)
nRBC: 0 % (ref 0.0–0.2)

## 2021-03-08 LAB — GLUCOSE, CAPILLARY
Glucose-Capillary: 103 mg/dL — ABNORMAL HIGH (ref 70–99)
Glucose-Capillary: 97 mg/dL (ref 70–99)
Glucose-Capillary: 122 mg/dL — ABNORMAL HIGH (ref 70–99)
Glucose-Capillary: 135 mg/dL — ABNORMAL HIGH (ref 70–99)
Glucose-Capillary: 121 mg/dL — ABNORMAL HIGH (ref 70–99)
Glucose-Capillary: 113 mg/dL — ABNORMAL HIGH (ref 70–99)

## 2021-03-08 LAB — CHLORIDE, URINE, RANDOM: Chloride Urine: 144 mmol/L

## 2021-03-08 LAB — CALCIUM, IONIZED
Calcium, Ionized, Serum: 3.6 mg/dL — ABNORMAL LOW (ref 4.5–5.6)
Calcium, Ionized, Serum: 4.4 mg/dL — ABNORMAL LOW (ref 4.5–5.6)

## 2021-03-08 LAB — NA AND K (SODIUM & POTASSIUM), RAND UR
Potassium Urine: 26 mmol/L
Sodium, Ur: 88 mmol/L

## 2021-03-08 LAB — MAGNESIUM: Magnesium: 1.6 mg/dL — ABNORMAL LOW (ref 1.7–2.4)

## 2021-03-08 MED ORDER — SODIUM BICARBONATE 650 MG PO TABS
1300.0000 mg | ORAL_TABLET | Freq: Two times a day (BID) | ORAL | Status: DC
  Administered 2021-03-08 – 2021-03-09 (×3): 1300 mg via ORAL
  Filled 2021-03-08 (×3): qty 2

## 2021-03-08 MED ORDER — MAGNESIUM SULFATE 2 GM/50ML IV SOLN
2.0000 g | INTRAVENOUS | Status: DC
  Administered 2021-03-08: 2 g via INTRAVENOUS
  Filled 2021-03-08: qty 50

## 2021-03-08 MED ORDER — PANCRELIPASE (LIP-PROT-AMYL) 12000-38000 UNITS PO CPEP
24000.0000 [IU] | ORAL_CAPSULE | Freq: Three times a day (TID) | ORAL | Status: DC
  Administered 2021-03-09 – 2021-03-12 (×11): 24000 [IU] via ORAL
  Filled 2021-03-08 (×13): qty 2

## 2021-03-08 MED ORDER — DIPHENOXYLATE-ATROPINE 2.5-0.025 MG PO TABS
1.0000 | ORAL_TABLET | Freq: Four times a day (QID) | ORAL | Status: DC | PRN
  Administered 2021-03-10: 1 via ORAL
  Filled 2021-03-08: qty 1

## 2021-03-08 MED ORDER — MAGNESIUM SULFATE 2 GM/50ML IV SOLN
2.0000 g | Freq: Once | INTRAVENOUS | Status: AC
  Administered 2021-03-08: 2 g via INTRAVENOUS
  Filled 2021-03-08: qty 50

## 2021-03-08 MED ORDER — SODIUM BICARBONATE 8.4 % IV SOLN
50.0000 meq | Freq: Once | INTRAVENOUS | Status: AC
  Administered 2021-03-08: 50 meq via INTRAVENOUS
  Filled 2021-03-08: qty 50

## 2021-03-08 MED FILL — Sodium Bicarbonate IV Soln 8.4%: INTRAVENOUS | Qty: 150 | Status: AC

## 2021-03-08 MED FILL — Water For IV Injection: INTRAVENOUS | Qty: 1000 | Status: AC

## 2021-03-08 NOTE — Progress Notes (Signed)
  Echocardiogram 2D Echocardiogram has been performed.  Ryan Cochran 03/08/2021, 4:49 PM

## 2021-03-08 NOTE — Plan of Care (Signed)
BMP Latest Ref Rng & Units 03/08/2021 03/08/2021 03/07/2021  Glucose 70 - 99 mg/dL 127(H) 86 120(H)  BUN 8 - 23 mg/dL 29(H) 31(H) 43(H)  Creatinine 0.61 - 1.24 mg/dL 0.90 0.75 1.11  BUN/Creat Ratio 6 - 22 (calc) - - -  Sodium 135 - 145 mmol/L 139 136 138  Potassium 3.5 - 5.1 mmol/L 4.2 4.0 4.0  Chloride 98 - 111 mmol/L 113(H) 114(H) 116(H)  CO2 22 - 32 mmol/L 21(L) 14(L) 19(L)  Calcium 8.9 - 10.3 mg/dL 7.3(L) 7.0(L) 7.5(L)     Bicarb recovered with enteral repletion. Will continue rather than restarting IV infusion. Electrolytes stable.  Will recheck in AM.  Julian Hy, DO 03/08/21 3:59 PM [facility]

## 2021-03-08 NOTE — Progress Notes (Signed)
NAME:  Ryan Cochran. MRN:  [id number] DOB:  [birth year] LOS: 2 ADMISSION DATE:  03/06/2021 DATE OF SERVICE:  03/06/2021  CHIEF COMPLAINT:  hypotension   History of Present Illness  This 87 y.o. Caucasian male presented to the [facility] Emergency Department via EMS with complaints of labored breathing.  Family members called EMS (they were visiting the patient at his abode in an assited livign facility, where he lives independently).  EMS reported that they found the patient to be tachypneic and cyanotic.  SpO2 was not detectable, and the patient was demonstrated word searching and garbled speech.  He was transported to the ER, where [facility] was called.  Head CT was reportedly negative.  Additionally, he was found to be hypotensive, for which he has been given LR boluses, which resulted in resolution of his neurologic symptoms.  At the time of clinical interview, he is alert, awake, oriented to time, person and place.  He is able to provide clinical history.  In fact, he reports that he has been struggling with large volume watery diarrhea for the past 5 months, which he associates with the beginning of radiation treatment for prostate cancer.  Been treating it with Imodium and with probiotics without relief.  He has not been tested for C. difficile or other stool pathogens.  The patient has had a 30 pound weight loss.   Procedures:     Significant Diagnostic Tests:    Interim history/subjective:  Vomiting this morning, not sure what precipitated this. Hasn't had nausea since. Less watery BMs.   Objective   BP (!) 92/51   Pulse 63   Temp 97.6 F (36.4 C) (Axillary)   Resp 18   Ht 5\' 9"  (1.753 m)   Wt 67.8 kg   SpO2 98%   BMI 22.07 kg/m     Filed Weights   03/06/21 1517 03/07/21 0807 03/08/21 0400  Weight: 65.2 kg 64.3 kg 67.8 kg    Intake/Output Summary (Last 24 hours) at 03/08/2021 0801 Last data filed at 03/08/2021 0600 Gross per 24 hour  Intake 3641.3  ml  Output 1700 ml  Net 1941.3 ml        Examination: GENERAL: Frail appearing elderly man sitting up in bed watching TV, awake and alert HEENT: [place]/AT, eyes anicteric CHEST/LUNG: Breathing comfortably on RA, clear to auscultation bilaterally HEART: S1-S2, irregular rhythm, regular rate ABDOMEN: Soft, nontender, nondistended.   EXTREMITIES: No pretibial edema, no clubbing or cyanosis SKIN:  [place], warm, dry NEUROLOGIC: awake, alert, answering questions appropriately, face symmetric, normal speech   Resolved Hospital Problem list      Assessment & Plan:   Acute on chronic metabolic acidosis with multiple electrolyte derangements  due to chronic diarrhea.   Lactic acidosis resolved. Hypomagnesemia Hypokalemia Hypocalcemia -con't LR 125cc/hr -electrolytes repleted, recheck this afternoon -upon review of outpatient notes, plan was for trial of Creon> will start 2 pills with meals -start enteral bicarb BID; 1-time IV push 1 amp bicarb this morning -start lomotil QID PRN since C. Diff negative  Chronic diarrhea due to radiation proctitis -infectious workup for diarrhea -- GI panel pending -start lomotil QID PRN -may need GI to see inpatient -adding Creon 2 pills TIDAC  Hypotension, suspect this may be partly chronic due to recent weight loss (down about 20# since January 2022) -LR infusion  -con't midodrine TID -remain in ICU  -checking urine AG  Weight loss -RD consult  CVA- R lateral middle cerebral  peduncle, unknown chronicity. Neuro symptoms resolved in ED with volume resuscitation -Appreciate neurology's management -maintain adequate perfusion pressure> seems to be mentating well -PT, OT, SLP consults  Chronic Afib -con't digoxin, pharmacy managing dosing -monitor on tele -BPs too low for Bblockers or CCB -con't xarelto  AKI due to hypovolemia -con't to monitor -renally dose meds, avoid nephrotoxic meds -strict I/Os  DM, not requiring  insulin -accuchecks, SSI PRN -goal BG 140-180  Acute anemia, new since January 2022-- suspect this may be related to chronic illness -iron studies as an outpatient; likely has had poor absorption -transfuse for Hb <7 or hemodynamically significant bleeding  Acute thrombocytopenia, likely due to acute illness -monitor -transfuse for platelets <10 unless bleeding  Best practice:  Diet: diabetic Pain/Anxiety/Delirium protocol (if indicated): N/A VAP protocol (if indicated): N/A DVT prophylaxis: Xarelto GI prophylaxis: famotidine Glucose control: SSI Mobility/Activity: Bedrest   Code Status: Full Code Family Communication:  Patient updated at bedside Disposition: ICU   Labs   CBC: Recent Labs  Lab 03/06/21 1457 03/06/21 2113 03/07/21 0421 03/07/21 0500 03/07/21 0832 03/07/21 2224  WBC 6.5 5.0  --  3.9* 3.8* 3.6*  NEUTROABS 4.3  --   --   --   --   --   HGB 10.6* 9.5* 8.2* 9.2* 9.6* 8.8*  HCT 31.5* 28.2* 24.0* 27.6* 28.0* 28.6*  MCV 97.2 96.9  --  97.9 95.9 106.7*  PLT 164 149*  --  140* 131* 123*    Basic Metabolic Panel: Recent Labs  Lab 03/06/21 1457 03/06/21 1555 03/06/21 2113 03/06/21 2215 03/07/21 0238 03/07/21 0421 03/07/21 0500 03/07/21 1447 03/08/21 0049  NA 137  --  136  --   --  140 136 138 136  K 4.4  --  4.0  --   --  4.1 4.0 4.0 4.0  CL 113*  --  115*  --   --   --  113* 116* 114*  CO2 15*  --  14*  --   --   --  16* 19* 14*  GLUCOSE 112*  --  93  --   --   --  97 120* 86  BUN 71*  --  65*  --   --   --  54* 43* 31*  CREATININE 1.87*  --  1.53*  --   --   --  1.21 1.11 0.75  CALCIUM 6.6*  --  6.2*  --   --   --  7.4* 7.5* 7.0*  MG  --    < > 1.1* 1.7 2.0  --  2.0 1.8 1.6*  PHOS  --   --  3.3 2.9  --   --   --  3.0  --    < > = values in this interval not displayed.    This patient is critically ill with multiple organ system failure which requires frequent high complexity decision making, assessment, support, evaluation, and titration of  therapies. This was completed through the application of advanced monitoring technologies and extensive interpretation of multiple databases. During this encounter critical care time was devoted to patient care services described in this note for 40 minutes.  Julian Hy, DO 03/08/21 8:01 AM [facility]

## 2021-03-08 NOTE — Progress Notes (Signed)
Late entry, patient seen and examined, care provided as documented on the date of service.   NAME:  Ryan Cochran. MRN:  [id number] DOB:  [birth year] LOS: 2 ADMISSION DATE:  03/06/2021 DATE OF SERVICE:  03/06/2021  CHIEF COMPLAINT:  hypotension   History of Present Illness  This 87 y.o. Caucasian male presented to the [facility] Emergency Department via EMS with complaints of labored breathing.  Family members called EMS (they were visiting the patient at his abode in an assited livign facility, where he lives independently).  EMS reported that they found the patient to be tachypneic and cyanotic.  SpO2 was not detectable, and the patient was demonstrated word searching and garbled speech.  He was transported to the ER, where [facility]OKE was called.  Head CT was reportedly negative.  Additionally, he was found to be hypotensive, for which he has been given LR boluses, which resulted in resolution of his neurologic symptoms.  At the time of clinical interview, he is alert, awake, oriented to time, person and place.  He is able to provide clinical history.  In fact, he reports that he has been struggling with large volume watery diarrhea for the past 5 months, which he associates with the beginning of radiation treatment for prostate cancer.  Been treating it with Imodium and with probiotics without relief.  He has not been tested for C. difficile or other stool pathogens.  The patient has had a 30 pound weight loss.   Procedures:     Significant Diagnostic Tests:    Interim history/subjective:     Objective   BP (!) 79/52   Pulse (!) 59   Temp 97.6 F (36.4 C) (Axillary)   Resp 17   Ht 5\' 9"  (1.753 m)   Wt 64.3 kg   SpO2 99%   BMI 20.93 kg/m     Filed Weights   03/06/21 1517 03/07/21 0807  Weight: 65.2 kg 64.3 kg    Intake/Output Summary (Last 24 hours) at 03/08/2021 0744 Last data filed at 03/08/2021 0600 Gross per 24 hour  Intake 3641.3 ml  Output 1700 ml   Net 1941.3 ml        Examination: GENERAL: frail appearing elderly man sitting up in bed in NAD HEENT:/AT, eyes anicteric THROAT/ORAL CAVITY: Mouth is parched.  Normal dentition. No oral thrush. No exudate. Mucous membranes are moist. No tonsillar enlargement.  CHEST/LUNG: CTAB, breathing comfortably, no tachypnea. HEART: reg rate, irreg rhythm, S1S2 ABDOMEN: soft, NT EXTREMITIES: no edema, or cyanosis SKIN:  Warm, dry NEUROLOGIC: awake, alert, moving all extremities, answering questions appropriately   Resolved Hospital Problem list      Assessment & Plan:   Acute on chronic metabolic acidosis die to diarrhea with multiple electrolyte derangements.  hypomagnesemia Hypokalemia Hypocalcemia -volume resuscitated -electrolyte repletion, recheck in the afternoon -stopping bicarb gtt, replace with LR -antidiarrheals on hold until C diff comes back  Chronic diarrhea due to radiation proctitis -infectious workup for diarrhea -antidairrheals once infectious workup returns -may need GI to see inpatient  Hypotension, suspect this may be in part chronic due to recent weight loss (down almost 20# since January 2022) -LR infusion, bolus given in afternoon -start midodrine TID -remain in ICU   Weight loss -RD consult  CVA- R lateral middle cerebral peduncle, unknown chronicity. Neuro symptoms resolved in ED with volume resuscitation -appreciate neurology's assistance -maintain adequate perfusion pressure -PT, OT, SLP  Chronic Afib -con't digoxin, pharmacy managing dosing  AKI due  to hypovolemia -volume resuscitation -strict I/Os -renally dose meds, avoid nephrotoxic meds  DM -accuchecks, SSI PRN -goal BG 140-180  Best practice:  Diet: diabetic Pain/Anxiety/Delirium protocol (if indicated): N/A VAP protocol (if indicated): N/A DVT prophylaxis: Xarelto GI prophylaxis: famotidine Glucose control: SSI Mobility/Activity: Bedrest   Code Status: Full Code Family  Communication:  Patient updated at bedside Disposition: admit to ICU   Labs   CBC: Recent Labs  Lab 03/06/21 1457 03/06/21 2113 03/07/21 0421 03/07/21 0500 03/07/21 0832 03/07/21 2224  WBC 6.5 5.0  --  3.9* 3.8* 3.6*  NEUTROABS 4.3  --   --   --   --   --   HGB 10.6* 9.5* 8.2* 9.2* 9.6* 8.8*  HCT 31.5* 28.2* 24.0* 27.6* 28.0* 28.6*  MCV 97.2 96.9  --  97.9 95.9 106.7*  PLT 164 149*  --  140* 131* 123*    Basic Metabolic Panel: Recent Labs  Lab 03/06/21 1457 03/06/21 1555 03/06/21 2113 03/06/21 2215 03/07/21 0238 03/07/21 0421 03/07/21 0500 03/07/21 1447 03/08/21 0049  NA 137  --  136  --   --  140 136 138 136  K 4.4  --  4.0  --   --  4.1 4.0 4.0 4.0  CL 113*  --  115*  --   --   --  113* 116* 114*  CO2 15*  --  14*  --   --   --  16* 19* 14*  GLUCOSE 112*  --  93  --   --   --  97 120* 86  BUN 71*  --  65*  --   --   --  54* 43* 31*  CREATININE 1.87*  --  1.53*  --   --   --  1.21 1.11 0.75  CALCIUM 6.6*  --  6.2*  --   --   --  7.4* 7.5* 7.0*  MG  --    < > 1.1* 1.7 2.0  --  2.0 1.8 1.6*  PHOS  --   --  3.3 2.9  --   --   --  3.0  --    < > = values in this interval not displayed.    This patient is critically ill with multiple organ system failure which requires frequent high complexity decision making, assessment, support, evaluation, and titration of therapies. This was completed through the application of advanced monitoring technologies and extensive interpretation of multiple databases. During this encounter critical care time was devoted to patient care services described in this note for 40 minutes.  Julian Hy, DO 03/08/21 7:56 AM [place] [facility]

## 2021-03-09 DIAGNOSIS — R571 Hypovolemic shock: Secondary | ICD-10-CM | POA: Diagnosis not present

## 2021-03-09 LAB — CBC
HCT: 27.6 % — ABNORMAL LOW (ref 39.0–52.0)
Hemoglobin: 9.3 g/dL — ABNORMAL LOW (ref 13.0–17.0)
MCH: 32.9 pg (ref 26.0–34.0)
MCHC: 33.7 g/dL (ref 30.0–36.0)
MCV: 97.5 fL (ref 80.0–100.0)
Platelets: 141 10*3/uL — ABNORMAL LOW (ref 150–400)
RBC: 2.83 MIL/uL — ABNORMAL LOW (ref 4.22–5.81)
RDW: 13.9 % (ref 11.5–15.5)
WBC: 4.7 10*3/uL (ref 4.0–10.5)
nRBC: 0 % (ref 0.0–0.2)

## 2021-03-09 LAB — GLUCOSE, CAPILLARY
Glucose-Capillary: 85 mg/dL (ref 70–99)
Glucose-Capillary: 85 mg/dL (ref 70–99)
Glucose-Capillary: 102 mg/dL — ABNORMAL HIGH (ref 70–99)
Glucose-Capillary: 99 mg/dL (ref 70–99)
Glucose-Capillary: 116 mg/dL — ABNORMAL HIGH (ref 70–99)

## 2021-03-09 LAB — BASIC METABOLIC PANEL
Anion gap: 4 — ABNORMAL LOW (ref 5–15)
BUN: 31 mg/dL — ABNORMAL HIGH (ref 8–23)
CO2: 22 mmol/L (ref 22–32)
Calcium: 7.1 mg/dL — ABNORMAL LOW (ref 8.9–10.3)
Chloride: 112 mmol/L — ABNORMAL HIGH (ref 98–111)
Creatinine, Ser: 0.91 mg/dL (ref 0.61–1.24)
GFR, Estimated: >60 mL/min (ref >60)
Glucose, Bld: 97 mg/dL (ref 70–99)
Potassium: 4.1 mmol/L (ref 3.5–5.1)
Sodium: 138 mmol/L (ref 135–145)

## 2021-03-09 LAB — MAGNESIUM: Magnesium: 1.9 mg/dL (ref 1.7–2.4)

## 2021-03-09 MED ORDER — ENSURE ENLIVE PO LIQD
237.0000 mL | Freq: Three times a day (TID) | ORAL | Status: DC
  Administered 2021-03-09 – 2021-03-12 (×7): 237 mL via ORAL

## 2021-03-09 MED ORDER — MIDODRINE HCL 5 MG PO TABS
10.0000 mg | ORAL_TABLET | Freq: Three times a day (TID) | ORAL | Status: DC
  Administered 2021-03-09 – 2021-03-12 (×10): 10 mg via ORAL
  Filled 2021-03-09 (×10): qty 2

## 2021-03-09 MED ORDER — ADULT MULTIVITAMIN W/MINERALS CH
1.0000 | ORAL_TABLET | Freq: Every day | ORAL | Status: DC
  Administered 2021-03-09 – 2021-03-12 (×4): 1 via ORAL
  Filled 2021-03-09 (×4): qty 1

## 2021-03-09 NOTE — Progress Notes (Signed)
NAME:  Ryan Cochran. MRN:  [id number] DOB:  May 07, 1933 LOS: 3 ADMISSION DATE:  03/06/2021 DATE OF SERVICE:  03/06/2021  CHIEF COMPLAINT:  hypotension   History of Present Illness  This 87 y.o. Caucasian male presented to the [facility] Emergency Department via EMS with complaints of labored breathing.  Family members called EMS (they were visiting the patient at his abode in an assited livign facility, where he lives independently).  EMS reported that they found the patient to be tachypneic and cyanotic.  SpO2 was not detectable, and the patient was demonstrated word searching and garbled speech.  He was transported to the ER, where [facility] STROKE was called.  Head CT was reportedly negative.  Additionally, he was found to be hypotensive, for which he has been given LR boluses, which resulted in resolution of his neurologic symptoms.  At the time of clinical interview, he is alert, awake, oriented to time, person and place.  He is able to provide clinical history.  In fact, he reports that he has been struggling with large volume watery diarrhea for the past 5 months, which he associates with the beginning of radiation treatment for prostate cancer.  Been treating it with Imodium and with probiotics without relief.  He has not been tested for C. difficile or other stool pathogens.  The patient has had a 30 pound weight loss.   Procedures:     Significant Diagnostic Tests:   5/6 CT head>> no acute findings 5/9 echocardiogram>> LVEF 60 to 65% with moderate biatrial dilation    MICRO: 5/6 GI panel>> negative 5/6 C. Difficile>> negative 5/7 MRSA>> negative   Interim history/subjective:    Feeling well this morning, sitting up with no more vomiting, continued chronic diarrhea, continued borderline low blood pressure.  Echocardiogram without significant findings  Objective   BP (!) 98/44   Pulse 89   Temp 98.1 F (36.7 C) (Oral)   Resp 16   Ht 5\' 9"  (1.753 m)   Wt  67.8 kg   SpO2 91%   BMI 22.07 kg/m     Filed Weights   03/06/21 1517 03/07/21 0807 03/08/21 0400  Weight: 65.2 kg 64.3 kg 67.8 kg    Intake/Output Summary (Last 24 hours) at 03/09/2021 0806 Last data filed at 03/09/2021 0500 Gross per 24 hour  Intake 3138.59 ml  Output 500 ml  Net 2638.59 ml        General: Elderly male, awake eating and conversational in no distress  HEENT: MM pink/moist, clear anicteric, pupils equal Neuro: Awake, oriented x3, following commands, moving all extremities, appropriate conversation CV: s1s2 rrr, no m/r/g PULM: Clear bilaterally, good oxygen saturations on room air without tachypnea or accessory muscle use GI: soft, bsx4 active  Extremities: warm/dry, no edema  Skin: no rashes or lesions    Resolved Hospital Problem list   Lactic acidosis   Assessment & Plan:   Hypotension with acute on chronic metabolic acidosis with multiple electrolyte derangements  due to chronic diarrhea likely due to radiation proctitis GI panel negative, C. difficile negative P: -Continue electrolyte replacement and LR, now 7 L positive -Increase midodrine to 10 mg 3 times daily, suspect hypotension is somewhat chronic -Creon initiated -Continue p.o. bicarb -Lomotil initiated yesterday -Once blood pressure improves can transfer out of ICU, consider IV fluid bolus -Inpatient GI consult sees Dr. Hilarie Fredrickson outpatient     Weight loss -RD consult   CVA- R lateral middle cerebral peduncle, unknown chronicity.  Neuro symptoms resolved in ED with volume resuscitation Neurology consulted, and plan to complete stroke work-up P: -Appreciate neurology recommendations, echo completed, carotid Doppler results pending -Started on statin, on Xarelto -PT, OT, SLP consults -Continue to support blood pressure   Chronic Afib Rate controlled P -Continue digoxin, holding beta-blockers and calcium channel blockers -Echocardiogram with biatrial dilation but preserved  EF -Continue telemetry -On Xarelto -TSH 3 months ago within normal limits   Acute kidney injury  due to hypovolemia, improving P: -Continue to monitor renal indices and electrolytes -renally dose meds, avoid nephrotoxic meds -strict I/Os  DM, not requiring insulin -accuchecks, SSI PRN -goal BG 140-180  Acute anemia, new since January 2022- Suspected secondary to chronic illness and malabsorption  P: -Transfuse for hemoglobin less than 7 or signs of bleeding    Acute thrombocytopenia, likely due to acute illness Platelets stable yesterday at 133 P: -monitor -transfuse for platelets <10 unless bleeding  Best practice:  Diet: diabetic Pain/Anxiety/Delirium protocol (if indicated): N/A VAP protocol (if indicated): N/A DVT prophylaxis: Xarelto GI prophylaxis: famotidine Glucose control: SSI Mobility/Activity: Bedrest   Code Status: Full Code Family Communication:  Patient updated at bedside Disposition: ICU   Labs   CBC: Recent Labs  Lab 03/06/21 1457 03/06/21 2113 03/07/21 0500 03/07/21 0832 03/07/21 2224 03/08/21 0950 03/08/21 2203  WBC 6.5   < > 3.9* 3.8* 3.6* 4.1 4.6  NEUTROABS 4.3  --   --   --   --   --   --   HGB 10.6*   < > 9.2* 9.6* 8.8* 9.4* 8.9*  HCT 31.5*   < > 27.6* 28.0* 28.6* 27.9* 27.2*  MCV 97.2   < > 97.9 95.9 106.7* 96.5 100.0  PLT 164   < > 140* 131* 123* 133* 133*   < > = values in this interval not displayed.    Basic Metabolic Panel: Recent Labs  Lab 03/06/21 2113 03/06/21 2215 03/07/21 0238 03/07/21 0421 03/07/21 0500 03/07/21 1447 03/08/21 0049 03/08/21 1218 03/09/21 0201  NA 136  --   --    < > 136 138 136 139 138  K 4.0  --   --    < > 4.0 4.0 4.0 4.2 4.1  CL 115*  --   --   --  113* 116* 114* 113* 112*  CO2 14*  --   --   --  16* 19* 14* 21* 22  GLUCOSE 93  --   --   --  97 120* 86 127* 97  BUN 65*  --   --   --  54* 43* 31* 29* 31*  CREATININE 1.53*  --   --   --  1.21 1.11 0.75 0.90 0.91  CALCIUM 6.2*  --   --    --  7.4* 7.5* 7.0* 7.3* 7.1*  MG 1.1* 1.7 2.0  --  2.0 1.8 1.6*  --  1.9  PHOS 3.3 2.9  --   --   --  3.0  --   --   --    < > = values in this interval not displayed.    Otilio Carpen Raniya Golembeski, PA-C [place] [facility] See Amion for pager If no response to pager , please call [phone] until 7pm After 7:00 pm call Elink  [phone]?[place]

## 2021-03-09 NOTE — Evaluation (Signed)
Physical Therapy Evaluation Patient Details Name: Ryan Cochran. MRN: [id number] DOB: 10/28/1933 Today's Date: 03/09/2021   History of Present Illness  87 y.o. male presenting to ED via [place] from [place] on 5/6 with SOB, cyanosis of fingers and toes, tachypnea and diarrhea. Noted acute onset of expressive aphasia without weakness or facial droop en route. Code stroke called. Patient not a candidate for tPA 2/2 xarelto. CT no acute abnormality, but small hypodensity right lateral middle cerebral peduncle likely indeterminate age. Per neuro consult, "Etiology for patient symptoms likely encephalopathy" While in CT, patient hypotensive with SBP in 70's to 80's. IV bolus given. Patient diagnosed with primary respiratory alkalosis, hypovolemic shock, and AKI. PMHx significant for A-fib, CAD, DM, BPH, and prostate CA undergoing radiation.  Clinical Impression   Pt admitted secondary to problem above with deficits below. PTA patient was living alone in ILF (drives, walks 1/2 mile several times per day with no device). Pt currently requires min-guard assist for safety, however this was his first time ambulating this date. Anticipate good progress and return to ILF. Will continue to follow acutely to maximize functional mobility independence and safety.       Follow Up Recommendations No PT follow up;Other (comment) (Discussed utilizing wellness center classes at [facility])    Equipment Recommendations  None recommended by PT    Recommendations for Other Services       Precautions / Restrictions Precautions Precautions: Fall Precaution Comments: High fall risk Restrictions Weight Bearing Restrictions: No      Mobility  Bed Mobility Overal bed mobility: Needs Assistance Bed Mobility: Supine to Sit     Supine to sit: Min guard     General bed mobility comments: Min guard for safety. Patient completes task in reasonable amount of time.    Transfers Overall transfer  level: Needs assistance Equipment used: Rolling walker (2 wheeled);1 person hand held assist Transfers: Sit to/from [facility] Sit to Stand: Min guard Stand pivot transfers: Min guard       General transfer comment: no imbalance; repeated x 3 during session  Ambulation/Gait Ambulation/Gait assistance: Min guard Gait Distance (Feet): 180 Feet Assistive device: Rolling walker (2 wheeled) Gait Pattern/deviations: WFL(Within Functional Limits) Gait velocity: WNL   General Gait Details: use of RW for safety however feel pt could ambulate without on next visit  Stairs            Wheelchair Mobility    Modified Rankin (Stroke Patients Only)       Balance Overall balance assessment: Needs assistance Sitting-balance support: Feet supported;Single extremity supported Sitting balance-Leahy Scale: Good Sitting balance - Comments: Able to maintain static/dynamic sitting balance at EOB without external assist.   Standing balance support: Single extremity supported;Bilateral upper extremity supported;During functional activity Standing balance-Leahy Scale: Good Standing balance comment: Reliant on external assist or at least unilateral UE support to maintain static standing balance. Single Leg Stance - Right Leg: 3 Single Leg Stance - Left Leg: 5   Tandem Stance - Left Leg: 30 Rhomberg - Eyes Opened: 30 Rhomberg - Eyes Closed:  (incr sway and need to open eyes after 5 sec)                 Pertinent Vitals/Pain Pain Assessment: No/denies pain    Home Living Family/patient expects to be discharged to:: Private residence ([facility]) Living Arrangements: Alone   Type of Home: Independent living facility Home Access: Level entry     Home  Layout: One level Home Equipment: Walker - 2 wheels;Shower seat - built in;Grab bars - tub/shower      Prior Function Level of Independence: Independent         Comments: Independent with ADLs/IADLs;  drives; manages meds independently, plays billiards several times a week with friends.     Hand Dominance   Dominant Hand: Right    Extremity/Trunk Assessment   Upper Extremity Assessment Upper Extremity Assessment: Defer to OT evaluation    Lower Extremity Assessment Lower Extremity Assessment: RLE deficits/detail;LLE deficits/detail RLE Deficits / Details: strength WFL RLE Sensation: history of peripheral neuropathy LLE Deficits / Details: strength WFL LLE Sensation: history of peripheral neuropathy    Cervical / Trunk Assessment Cervical / Trunk Assessment: Kyphotic  Communication   Communication: No difficulties (Expressive aphasia has resolved.)  Cognition Arousal/Alertness: Awake/alert Behavior During Therapy: Impulsive (mildly with regards to lines) Overall Cognitive Status: Within Functional Limits for tasks assessed Area of Impairment: Safety/judgement                         Safety/Judgement: Decreased awareness of safety;Decreased awareness of deficits     General Comments: A&Ox4. Following commands. Initially trying to help with lines/tubes and responded to PT asking him to wait for me to help. Aware of his deficits (neuropathy)      General Comments General comments (skin integrity, edema, etc.): BP 104/50 in sitting; 94/48 in standing; denies dizziness    Exercises     Assessment/Plan    PT Assessment Patient needs continued PT services  PT Problem List Decreased balance;Decreased mobility;Decreased knowledge of use of DME;Impaired sensation       PT Treatment Interventions DME instruction;Gait training;Functional mobility training;Therapeutic activities;Therapeutic exercise;Balance training;Patient/family education    PT Goals (Current goals can be found in the Care Plan section)  Acute Rehab PT Goals Patient Stated Goal: To return home. PT Goal Formulation: With patient Time For Goal Achievement: 03/16/21 Potential to Achieve Goals:  Good    Frequency Min 3X/week   Barriers to discharge        Co-evaluation               AM-PAC PT "6 Clicks" Mobility  Outcome Measure Help needed turning from your back to your side while in a flat bed without using bedrails?: None Help needed moving from lying on your back to sitting on the side of a flat bed without using bedrails?: None Help needed moving to and from a bed to a chair (including a wheelchair)?: A Little Help needed standing up from a chair using your arms (e.g., wheelchair or bedside chair)?: A Little Help needed to walk in hospital room?: A Little Help needed climbing 3-5 steps with a railing? : A Little 6 Click Score: 20    End of Session Equipment Utilized During Treatment: Gait belt Activity Tolerance: Patient tolerated treatment well Patient left: in chair;with call bell/phone within reach;with nursing/sitter in room;with chair alarm set Nurse Communication: Mobility status PT Visit Diagnosis: Unsteadiness on feet (R26.81)    Time: 9417-4081 PT Time Calculation (min) (ACUTE ONLY): 30 min   Charges:   PT Evaluation $PT Eval Moderate Complexity: 1 Mod PT Treatments $Therapeutic Activity: 8-22 mins         Arby Barrette, PT Pager [phone]   Rexanne Mano 03/09/2021, 2:25 PM

## 2021-03-09 NOTE — TOC Initial Note (Signed)
Transition of Care (TOC) - Initial/Assessment Note    Patient Details  Name: Ryan Cochran. MRN: [id number] Date of Birth: 1933/06/01  Transition of Care [facility]) CM/SW Contact:    Bartholomew Crews, RN Phone Number: [phone] 03/09/2021, 6:07 PM  Clinical Narrative:                  Spoke with patient at the bedside. PTA home alone at [place]. Stated that [facility] is in the process of remodeling and developing a wellness center and dining area. He stated he is looking forward to participation.   Discussed PT working with him this afternoon. No recommended follow up. Patient agrees with this.   Patient typically drives himself to medical appointments. He stated he had recently been having diarrhea for the last 5 months which he was told may be the result of radiation treatment for prostate cancer. He is also followed by a GI doctor for this.   Stated that his daughter will provide transportation home at time of discharge.   TOC following for transition needs.   Expected Discharge Plan: Home/Self Care Barriers to Discharge: Continued Medical Work up   Patient Goals and CMS Choice Patient states their goals for this hospitalization and ongoing recovery are:: return to independent living at [place] Medicare.gov Compare Post Acute Care list provided to:: Patient Choice offered to / list presented to : NA  Expected Discharge Plan and Services Expected Discharge Plan: Home/Self Care In-house Referral: Clinical Social Work Discharge Planning Services: CM Consult Post Acute Care Choice: NA Living arrangements for the past 2 months: [place]                 DME Arranged: N/A DME Agency: NA       HH Arranged: NA [place] Agency: NA        Prior Living Arrangements/Services Living arrangements for the past 2 months: [place] Lives with:: Self Patient language and need for interpreter reviewed::  Yes Do you feel safe going back to the place where you live?: Yes      Need for Family Participation in Patient Care: Yes (Comment) Care giver support system in place?: Yes (comment)   Criminal Activity/Legal Involvement Pertinent to Current Situation/Hospitalization: No - Comment as needed  Activities of Daily Living Home Assistive Devices/Equipment: Hand-held shower hose,Grab bars in shower,Raised toilet seat with rails,Grab bars around toilet,Built-in shower seat ADL Screening (condition at time of admission) Patient's cognitive ability adequate to safely complete daily activities?: Yes Is the patient deaf or have difficulty hearing?: No Does the patient have difficulty seeing, even when wearing glasses/contacts?: No Does the patient have difficulty concentrating, remembering, or making decisions?: No Patient able to express need for assistance with ADLs?: No Does the patient have difficulty dressing or bathing?: No Independently performs ADLs?: Yes (appropriate for developmental age) Does the patient have difficulty walking or climbing stairs?: Yes Weakness of Legs: None Weakness of Arms/Hands: None  Permission Sought/Granted                  Emotional Assessment Appearance:: Appears stated age Attitude/Demeanor/Rapport: Engaged Affect (typically observed): Accepting   Alcohol / Substance Use: Not Applicable Psych Involvement: No (comment)  Admission diagnosis:  Hypovolemic shock ([place]) [R57.1] Patient Active Problem List   Diagnosis Date Noted  . Pressure injury of skin 03/08/2021  . Hypocalcemia 03/07/2021  . Hypovolemic shock ([place]) 03/06/2021  . Hypomagnesemia 03/06/2021  .  AKI (acute kidney injury) ([place]) 03/06/2021  . Intractable diarrhea 03/06/2021  . Severe dehydration 03/06/2021  . Lactic acidosis 03/06/2021  . Normocytic anemia 03/06/2021  . BPH (benign prostatic hyperplasia)   . Contact dermatitis   . HLD (hyperlipidemia)   . Neuropathy   .  Onychomycosis of toenail   . Prostate cancer ([place])   . Malignant neoplasm of prostate ([place]) 03/18/2020  . Left inguinal hernia 04/03/2018  . Osteoarthritis of right wrist 01/16/2018  . Allergic rhinitis 01/16/2018  . Type 2 diabetes mellitus with neurological manifestations, controlled ([place]) 06/29/2017  . Long term current use of anticoagulant therapy 02/21/2017  . HTN (hypertension) 02/15/2017  . Chronic atrial fibrillation ([place])   . CAD (coronary artery disease)   . BPH with elevated PSA   . DM neuropathy, type II diabetes mellitus ([place])   . Elevated PSA   . Mixed hyperlipidemia    PCP:  [name], MD Pharmacy:   [facility] [place], [place] AT [address] [place] [address] Phone: [phone] Fax: [phone]     Social Determinants of Health (SDOH) Interventions    Readmission Risk Interventions No flowsheet data found.

## 2021-03-09 NOTE — Progress Notes (Signed)
Initial Nutrition Assessment  DOCUMENTATION CODES:  Not applicable  INTERVENTION:  Consider checking Zinc level.  Continue current diet order.  Add Ensure Enlive po TID, each supplement provides 350 kcal and 20 grams of protein.  Add Magic cup TID with meals, each supplement provides 290 kcal and 9 grams of protein.  Add MVI with minerals daily.  NUTRITION DIAGNOSIS:  Increased nutrient needs related to acute illness as evidenced by estimated needs.  GOAL:  Patient will meet greater than or equal to 90% of their needs  MONITOR:  PO intake,Supplement acceptance,Labs,Weight trends,Skin,I & O's  REASON FOR ASSESSMENT:  Consult,Malnutrition Screening Tool Assessment of nutrition requirement/status  ASSESSMENT:  87 yo male with a PMH of A-fib, CAD, T2DM, BPH, and prostate CA undergoing radiation who presents with primary respiratory alkalosis, hypovolemic shock, and AKI. Admitted with SOB, cyanosis of fingers/toes, tachypnea, and diarrhea. 5/6 - negative for c.diff and GI panel negative 5/7 - negative for MRSA  Spoke with pt at bedside. Pt in good spirits. He reports eating well at home. Breakfast is cereal, a banana, and V8 tomato juice. Lunch is grilled cheese and tomato soup, chicken salad sandwich, [place] sandwich, hotdog, or similar with unsweetened tea or Coke Zero. Dinner is what his assisted living facility dining hall is serving that evening. Per Epic, pt ate 75% of breakfast and dinner and 25% of lunch yesterday.  He reports some diarrhea for about 3 months. He reports some nausea, but he reports some V8 tomato juice relieves it. He also reported 1 episode of emesis yesterday morning 10 minutes after finishing breakfast, reporting that it was the first time he had vomited in years.  Per MD note, pt reports a 30 lb weight loss. Pt endorsed this. Per Epic, pt has lost 15 lbs from 11/19/20 to admission, which is 9.7% in 4 months, which is significant and severe.  Recommend  Ensure TID and Magic Cup TID, as well as MVI with minerals. May consider checking Zinc levels as zinc deficiency can cause diarrhea - communicated recommendation to MD via secure chat.  Medications: reviewed; Ensure Enlive BID, Creon TID, Klor-Con 20 mEq, LR @ 100 ml/hr Labs: reviewed; Glucose 112, serum Ca 7.1  NUTRITION - FOCUSED PHYSICAL EXAM: Flowsheet Row Most Recent Value  Orbital Region Mild depletion  Upper Arm Region No depletion  Thoracic and Lumbar Region No depletion  Buccal Region Mild depletion  Temple Region No depletion  Clavicle Bone Region Mild depletion  Clavicle and Acromion Bone Region No depletion  Scapular Bone Region No depletion  Dorsal Hand No depletion  Patellar Region No depletion  Anterior Thigh Region No depletion  Posterior Calf Region No depletion  Edema (RD Assessment) None  Hair Reviewed  Eyes Reviewed  Mouth Reviewed  Skin Reviewed  Nails Reviewed     Diet Order:   Diet Order            Diet Carb Modified Fluid consistency: Thin; Room service appropriate? Yes  Diet effective now                EDUCATION NEEDS:  Education needs have been addressed  Skin:  Skin Assessment: Skin Integrity Issues: Skin Integrity Issues:: Stage I,Stage II Stage I: Pressure Injury - L heel Stage II: Pressure Injury - medial coccyx  Last BM:  03/08/21 - Type 7, medium amount  Height:  Ht Readings from Last 1 Encounters:  03/07/21 5\' 9"  (1.753 m)   Weight:  Wt Readings from Last 1 Encounters:  03/08/21 67.8 kg   Ideal Body Weight:  72.7 kg  BMI:  Body mass index is 22.07 kg/m.  Estimated Nutritional Needs:  Kcal:  0350-0938 Protein:  75-90 grams Fluid:  >1.75 L  Derrel Nip, RD, LDN Registered Dietitian After Hours/Weekend Pager # in [place]

## 2021-03-09 NOTE — Evaluation (Signed)
Occupational Therapy Evaluation Patient Details Name: Ryan Cochran. MRN: [id number] DOB: 08/11/1933 Today's Date: 03/09/2021    History of Present Illness 87 y.o. male presenting to ED via [place] from [place] on 5/6 with SOB, cyanosis of fingers and toes, tachypnea and diarrhea. Noted acute onset of expressive aphasia without weakness or facial droop en route. Code stroke called. Patient not a candidate for tPA 2/2 xarelto. CT no acute abnormality, but small hypodensity right lateral middle cerebral peduncle likely indeterminate age. Per neuro consult, "Etiology for patient symptoms likely encephalopathy" While in CT, patient hypotensive with SBP in 70's to 80's. IV bolus given. Patient diagnosed with primary respiratory alkalosis, hypovolemic shock, and AKI. PMHx significant for A-fib, CAD, DM, BPH, and prostate CA undergoing radiation.   Clinical Impression   PTA patient was living alone in an ILF apartment at [facility] and was grossly I with ADLs/IADLs without AD. Patient was driving and reports playing billiards with friends several times a week. Patient is currently functioning slightly below baseline demonstrating observed ADLs including toileting/hygiene/clothing management with Min A initially without AD, progressing to to Min guard with use of RW. Patient also limited by deficits listed below including decreased standing balance indicating increased fall risk and mild impulsivity and would benefit from continued acute OT services in prep for safe d/c home with HHOT and intermittent supervision/assist.     Follow Up Recommendations  Home health OT;Supervision - Intermittent    Equipment Recommendations  Other (comment) (Rolling walker)    Recommendations for Other Services       Precautions / Restrictions Precautions Precautions: Fall Precaution Comments: High fall risk Restrictions Weight Bearing Restrictions: No      Mobility Bed Mobility Overal bed  mobility: Needs Assistance Bed Mobility: Supine to Sit     Supine to sit: Min guard     General bed mobility comments: Min guard for safety. Patient completes task in reasonable amount of time.    Transfers Overall transfer level: Needs assistance Equipment used: Rolling walker (2 wheeled);1 person hand held assist Transfers: Sit to/from [facility] Sit to Stand: Min assist;Min guard Stand pivot transfers: Min guard       General transfer comment: Min guard for boosting and Min A to correct lateral LOB upon standing. Min gaurd for stand-pivot to [facility] with cues for safety and hand placement.    Balance Overall balance assessment: Needs assistance Sitting-balance support: Feet supported;Single extremity supported Sitting balance-Leahy Scale: Good Sitting balance - Comments: Able to maintain static/dynamic sitting balance at EOB without external assist.   Standing balance support: Single extremity supported;Bilateral upper extremity supported;During functional activity Standing balance-Leahy Scale: Poor Standing balance comment: Reliant on external assist or at least unilateral UE support to maintain static standing balance.                           ADL either performed or assessed with clinical judgement   ADL Overall ADL's : Needs assistance/impaired     Grooming: Wash/dry hands Grooming Details (indicate cue type and reason): Hand washing simulated with wash cloth in standing and Min guard for balance.                 Toilet Transfer: Magazine features editor Details (indicate cue type and reason): Min guard for transfer to [facility] on R with cues for hand placement and safety. Toileting- Water quality scientist and Hygiene: Min guard;Sit to/from stand Toileting -  Clothing Manipulation Details (indicate cue type and reason): Toileting/hygiene/clothing management with Min guard for balance and safety.     Functional mobility during ADLs:  Minimal assistance;Min guard;Rolling walker General ADL Comments: Patient limited by decreased balance, mild impulsivity, and low BP limiting functional mobility.     Vision Baseline Vision/History: Wears glasses Patient Visual Report: No change from baseline       Perception     Praxis      Pertinent Vitals/Pain Pain Assessment: No/denies pain     Hand Dominance Right   Extremity/Trunk Assessment Upper Extremity Assessment Upper Extremity Assessment: Overall WFL for tasks assessed   Lower Extremity Assessment Lower Extremity Assessment: Defer to PT evaluation       Communication Communication Communication: No difficulties (Expressive aphasia has resolved.)   Cognition Arousal/Alertness: Awake/alert Behavior During Therapy: Impulsive Overall Cognitive Status: Impaired/Different from baseline Area of Impairment: Safety/judgement                         Safety/Judgement: Decreased awareness of safety;Decreased awareness of deficits     General Comments: A&Ox4. Follows 1-2 step verbal commands with good accuracy. Patient requires cues for safety with mobility. Mild impulsivity.   General Comments  BP 99/51 upon entry. Seated EOB BP 102/43. In standing, BP 84/57. BP returned to 114/51 at conclusion of session. Patient states BPs have been soft for several months.    Exercises     Shoulder Instructions      Home Living Family/patient expects to be discharged to:: Other (Comment) ([facility])                             Home Equipment: Walker - 2 wheels          Prior Functioning/Environment Level of Independence: Independent        Comments: Independent with ADLs/IADLs; drives; manages meds independently, plays billiards several times a week with friends.        OT Problem List: Impaired balance (sitting and/or standing);Decreased safety awareness      OT Treatment/Interventions: Self-care/ADL training;Therapeutic  exercise;DME and/or AE instruction;Therapeutic activities;Patient/family education;Balance training    OT Goals(Current goals can be found in the care plan section) Acute Rehab OT Goals Patient Stated Goal: To return home. OT Goal Formulation: With patient Time For Goal Achievement: 03/23/21 Potential to Achieve Goals: Good ADL Goals Pt Will Perform Grooming: with modified independence;standing Pt Will Perform Upper Body Dressing: with modified independence;standing Pt Will Perform Lower Body Dressing: with modified independence;sit to/from stand Pt Will Transfer to Toilet: with modified independence;ambulating Pt Will Perform Toileting - Clothing Manipulation and hygiene: with modified independence;sit to/from stand Additional ADL Goal #1: Patient will complete ADL tranfsers with Mod I and AD demonstrating good safety awareness.  OT Frequency: Min 2X/week   Barriers to D/C: Decreased caregiver support  Lives alone in [place] apartment.       Co-evaluation              AM-PAC OT "6 Clicks" Daily Activity     Outcome Measure Help from another person eating meals?: None Help from another person taking care of personal grooming?: A Little Help from another person toileting, which includes using toliet, bedpan, or urinal?: A Little Help from another person bathing (including washing, rinsing, drying)?: A Little Help from another person to put on and taking off regular upper body clothing?: A Little Help from another person to  put on and taking off regular lower body clothing?: A Little 6 Click Score: 19   End of Session Equipment Utilized During Treatment: Gait belt;Rolling walker Nurse Communication: Mobility status;Other (comment) (Need for RW)  Activity Tolerance: Patient tolerated treatment well Patient left: in chair;with call bell/phone within reach;with chair alarm set  OT Visit Diagnosis: Unsteadiness on feet (R26.81)                Time: 0315-9458 OT Time Calculation  (min): 33 min Charges:  OT General Charges $OT Visit: 1 Visit OT Evaluation $OT Eval Moderate Complexity: 1 Mod OT Treatments $Self Care/Home Management : 8-22 mins  Phillippe Orlick H. OTR/L Supplemental OT, Department of rehab services [phone]  Kier Smead R H. 03/09/2021, 11:53 AM

## 2021-03-10 DIAGNOSIS — D649 Anemia, unspecified: Secondary | ICD-10-CM

## 2021-03-10 LAB — CBC
HCT: 25.7 % — ABNORMAL LOW (ref 39.0–52.0)
Hemoglobin: 8.6 g/dL — ABNORMAL LOW (ref 13.0–17.0)
MCH: 32.6 pg (ref 26.0–34.0)
MCHC: 33.5 g/dL (ref 30.0–36.0)
MCV: 97.3 fL (ref 80.0–100.0)
Platelets: 142 10*3/uL — ABNORMAL LOW (ref 150–400)
RBC: 2.64 MIL/uL — ABNORMAL LOW (ref 4.22–5.81)
RDW: 13.9 % (ref 11.5–15.5)
WBC: 5.7 10*3/uL (ref 4.0–10.5)
nRBC: 0 % (ref 0.0–0.2)

## 2021-03-10 LAB — GLUCOSE, CAPILLARY
Glucose-Capillary: 74 mg/dL (ref 70–99)
Glucose-Capillary: 102 mg/dL — ABNORMAL HIGH (ref 70–99)
Glucose-Capillary: 131 mg/dL — ABNORMAL HIGH (ref 70–99)
Glucose-Capillary: 113 mg/dL — ABNORMAL HIGH (ref 70–99)

## 2021-03-10 LAB — IRON AND TIBC
Iron: 21 ug/dL — ABNORMAL LOW (ref 45–182)
Saturation Ratios: 11 % — ABNORMAL LOW (ref 17.9–39.5)
TIBC: 193 ug/dL — ABNORMAL LOW (ref 250–450)
UIBC: 172 ug/dL

## 2021-03-10 LAB — CALCIUM, IONIZED
Calcium, Ionized, Serum: 4.5 mg/dL (ref 4.5–5.6)
Calcium, Ionized, Serum: 4.5 mg/dL (ref 4.5–5.6)
Calcium, Ionized, Serum: 4.4 mg/dL — ABNORMAL LOW (ref 4.5–5.6)
Calcium, Ionized, Serum: 4.5 mg/dL (ref 4.5–5.6)

## 2021-03-10 LAB — BASIC METABOLIC PANEL
Anion gap: 5 (ref 5–15)
BUN: 28 mg/dL — ABNORMAL HIGH (ref 8–23)
CO2: 25 mmol/L (ref 22–32)
Calcium: 7.3 mg/dL — ABNORMAL LOW (ref 8.9–10.3)
Chloride: 106 mmol/L (ref 98–111)
Creatinine, Ser: 0.74 mg/dL (ref 0.61–1.24)
GFR, Estimated: >60 mL/min (ref >60)
Glucose, Bld: 93 mg/dL (ref 70–99)
Potassium: 4.3 mmol/L (ref 3.5–5.1)
Sodium: 136 mmol/L (ref 135–145)

## 2021-03-10 LAB — FOLATE: Folate: 25.9 ng/mL (ref >5.9)

## 2021-03-10 LAB — VITAMIN B12: Vitamin B-12: 1321 pg/mL — ABNORMAL HIGH (ref 180–914)

## 2021-03-10 LAB — PHOSPHORUS: Phosphorus: 2.2 mg/dL — ABNORMAL LOW (ref 2.5–4.6)

## 2021-03-10 LAB — MAGNESIUM: Magnesium: 1.4 mg/dL — ABNORMAL LOW (ref 1.7–2.4)

## 2021-03-10 LAB — FERRITIN: Ferritin: 160 ng/mL (ref 24–336)

## 2021-03-10 MED ORDER — MAGNESIUM SULFATE 4 GM/100ML IV SOLN
4.0000 g | Freq: Once | INTRAVENOUS | Status: AC
  Administered 2021-03-10: 4 g via INTRAVENOUS
  Filled 2021-03-10: qty 100

## 2021-03-10 MED ORDER — DIPHENOXYLATE-ATROPINE 2.5-0.025 MG PO TABS
1.0000 | ORAL_TABLET | Freq: Two times a day (BID) | ORAL | Status: DC
  Administered 2021-03-10 – 2021-03-12 (×4): 1 via ORAL
  Filled 2021-03-10 (×4): qty 1

## 2021-03-10 MED ORDER — DIPHENOXYLATE-ATROPINE 2.5-0.025 MG PO TABS: 1.0000 | ORAL_TABLET | ORAL | Status: DC | PRN

## 2021-03-10 MED ORDER — POTASSIUM PHOSPHATES 15 MMOLE/5ML IV SOLN
10.0000 mmol | Freq: Once | INTRAVENOUS | Status: AC
  Administered 2021-03-10: 10 mmol via INTRAVENOUS
  Filled 2021-03-10: qty 3.33

## 2021-03-10 NOTE — Telephone Encounter (Signed)
Called patient informed him yes it can be a side effect but doesn't seem to be the issue. He will continue for now. Discussed with Dr. Agustin Cree verbally and he decided to have him continue.

## 2021-03-10 NOTE — Progress Notes (Signed)
Pharmacy Electrolyte Replacement  Recent Labs:  Recent Labs    03/10/21 0203  K 4.3  MG 1.4*  PHOS 2.2*  CREATININE 0.74    Low Values as above  Plan:   Mag 4 gm IV x 1 KPhos 10 mmol IV x 1  Alanda Slim, PharmD, [place] Clinical Pharmacist Please see AMION for all Pharmacists' Contact Phone Numbers 03/10/2021, 7:34 AM
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
## 2021-03-10 NOTE — Telephone Encounter (Signed)
Called patient. He is currently admitted to [facility] ICU for dehydration and still having diarrhea. He has had diarrhea for 5 months. He read that a side effect of digoxin is uncontrollable diarrhea and wanted Dr. Wendy Poet opinion on this. Will check with him.

## 2021-03-10 NOTE — Progress Notes (Signed)
NAME:  Ryan Cochran. MRN:  [id number] DOB:  Jan 06, 1933 LOS: 4 ADMISSION DATE:  03/06/2021 DATE OF SERVICE:  03/06/2021  CHIEF COMPLAINT:  hypotension   History of Present Illness  This 87 y.o. Caucasian male presented to the [facility] Emergency Department via EMS with complaints of labored breathing.  Family members called EMS (they were visiting the patient at his abode in an assited living facility, where he lives independently).  EMS reported that they found the patient to be tachypneic and cyanotic.  SpO2 was not detectable, and the patient was demonstrated word searching and garbled speech.  He was transported to the ER, where CODE STROKE was called.  Head CT was reportedly negative.  Additionally, he was found to be hypotensive, for which he has been given LR boluses, which resulted in resolution of his neurologic symptoms.  At the time of clinical interview, he is alert, awake, oriented to time, person and place.  He is able to provide clinical history.  In fact, he reports that he has been struggling with large volume watery diarrhea for the past 5 months, which he associates with the beginning of radiation treatment for prostate cancer.  Been treating it with Imodium and with probiotics without relief.  He has not been tested for C. difficile or other stool pathogens.  The patient has had a 30 pound weight loss.   Procedures:  None  Significant Diagnostic Tests:   5/6 CT head>> no acute findings 5/9 echocardiogram>> LVEF 60 to 65% with moderate biatrial dilation  MICRO: 5/6 GI panel>> negative 5/6 C. Difficile>> negative 5/7 MRSA>> negative   Interim history/subjective:    Feeling well this morning, sitting up with no more vomiting, continued chronic diarrhea, continued borderline low blood pressure.  Echocardiogram without significant findings  Objective   BP (!) 104/45   Pulse 78   Temp 97.7 F (36.5 C) (Oral)   Resp 17   Ht 5\' 9"  (1.753 m)   Wt  67.8 kg   SpO2 95%   BMI 22.07 kg/m     Filed Weights   03/06/21 1517 03/07/21 0807 03/08/21 0400  Weight: 65.2 kg 64.3 kg 67.8 kg    Intake/Output Summary (Last 24 hours) at 03/10/2021 0932 Last data filed at 03/10/2021 0200 Gross per 24 hour  Intake 2247.06 ml  Output 375 ml  Net 1872.06 ml    General: Elderly male, awake eating and conversational in no distress  HEENT: MM pink/moist, clear anicteric, pupils equal Neuro: Awake, oriented x3, following commands, moving all extremities, appropriate conversation CV: s1s2 rrr, no m/r/g PULM: Clear bilaterally, good oxygen saturations on room air without tachypnea or accessory muscle use GI: soft, bsx4 active  Extremities: warm/dry, no edema  Skin: no rashes or lesions  Resolved Hospital Problem list   Lactic acidosis AKI  Assessment & Plan:   42 yoM admitted for altered mental status. Code stroke was negative. In the ED he was hypotensive, responded to IVF resuscitation. Reported five months of diarrhea after radiation treatment for prostate cancer.   Hypotension with acute on chronic metabolic acidosis with multiple electrolyte derangements  due to chronic diarrhea likely due to radiation proctitis GI panel negative, C. difficile negative.  Patient endorses first formed bowel movement this morning.  He was started on Creon and Lomotil.  However per chart review looks like he has not been taking any Lomotil.  Can also consider loperamide.  Consulted GI, appreciate recommendations.  Patient is  stable, not requiring pressor support, will transfer out of ICU today. - Continue electrolyte replacement - Discontinue LR, now 7 L positive - Continue midodrine to 10 mg 3 times daily, suspect hypotension is somewhat chronic - Continue creon and lomotil - Continue p.o. bicarb  Weight loss -RD consult  CVA- R lateral middle cerebral peduncle, unknown chronicity. - Neuro symptoms resolved in ED with volume resuscitation - Appreciate  neurology recommendations - Echo completed wnl, carotid doppler demonstrate b/l ICA 1-39% stenosis  - Started on statin, on Xarelto - PT, OT, SLP consults - Continue to support blood pressure  Chronic Afib - Continue to be rate controlled - Continue digoxin, holding beta-blockers and calcium channel blockers - Echocardiogram with biatrial dilation but preserved EF - Continue telemetry - On Xarelto - TSH 3 months ago within normal limits  Acute kidney injury, resolved - Continue to monitor renal indices and electrolytes  DM, not requiring insulin -accuchecks, SSI PRN -goal BG 140-180  Acute anemia, new since January 2022 Suspected secondary to chronic illness and malabsorption -Transfuse for hemoglobin less than 7 or signs of bleeding  Acute thrombocytopenia, likely due to acute illness Platelets stable, 142. - Continue to monitor - Transfuse for platelets <10 unless bleeding  Best practice:  Diet: diabetic Pain/Anxiety/Delirium protocol (if indicated): N/A VAP protocol (if indicated): N/A DVT prophylaxis: Xarelto GI prophylaxis: famotidine Glucose control: SSI Mobility/Activity: Bedrest   Code Status: Full Code Family Communication:  Patient updated Disposition: transfer out of ICU today   Labs   CBC: Recent Labs  Lab 03/06/21 1457 03/06/21 2113 03/07/21 2224 03/08/21 0950 03/08/21 2203 03/09/21 1135 03/10/21 0203  WBC 6.5   < > 3.6* 4.1 4.6 4.7 5.7  NEUTROABS 4.3  --   --   --   --   --   --   HGB 10.6*   < > 8.8* 9.4* 8.9* 9.3* 8.6*  HCT 31.5*   < > 28.6* 27.9* 27.2* 27.6* 25.7*  MCV 97.2   < > 106.7* 96.5 100.0 97.5 97.3  PLT 164   < > 123* 133* 133* 141* 142*   < > = values in this interval not displayed.    Basic Metabolic Panel: Recent Labs  Lab 03/06/21 2113 03/06/21 2215 03/07/21 0238 03/07/21 0500 03/07/21 1447 03/08/21 0049 03/08/21 1218 03/09/21 0201 03/10/21 0203  NA 136  --    < > 136 138 136 139 138 136  K 4.0  --    < > 4.0  4.0 4.0 4.2 4.1 4.3  CL 115*  --   --  113* 116* 114* 113* 112* 106  CO2 14*  --   --  16* 19* 14* 21* 22 25  GLUCOSE 93  --   --  97 120* 86 127* 97 93  BUN 65*  --   --  54* 43* 31* 29* 31* 28*  CREATININE 1.53*  --   --  1.21 1.11 0.75 0.90 0.91 0.74  CALCIUM 6.2*  --   --  7.4* 7.5* 7.0* 7.3* 7.1* 7.3*  MG 1.1* 1.7   < > 2.0 1.8 1.6*  --  1.9 1.4*  PHOS 3.3 2.9  --   --  3.0  --   --   --  2.2*   < > = values in this interval not displayed.    Harlow Ohms, DO PGY-2 IMTS

## 2021-03-11 DIAGNOSIS — Z7901 Long term (current) use of anticoagulants: Secondary | ICD-10-CM

## 2021-03-11 LAB — BASIC METABOLIC PANEL
Anion gap: 4 — ABNORMAL LOW (ref 5–15)
BUN: 26 mg/dL — ABNORMAL HIGH (ref 8–23)
CO2: 25 mmol/L (ref 22–32)
Calcium: 7.3 mg/dL — ABNORMAL LOW (ref 8.9–10.3)
Chloride: 107 mmol/L (ref 98–111)
Creatinine, Ser: 0.79 mg/dL (ref 0.61–1.24)
GFR, Estimated: >60 mL/min (ref >60)
Glucose, Bld: 98 mg/dL (ref 70–99)
Potassium: 4.7 mmol/L (ref 3.5–5.1)
Sodium: 136 mmol/L (ref 135–145)

## 2021-03-11 LAB — GLIA (IGA/G) + TTG IGA
Antigliadin Abs, IgA: 3 units (ref 0–19)
Gliadin IgG: 2 units (ref 0–19)
Tissue Transglutaminase Ab, IgA: <2 U/mL (ref 0–3)

## 2021-03-11 LAB — GLUCOSE, CAPILLARY
Glucose-Capillary: 86 mg/dL (ref 70–99)
Glucose-Capillary: 99 mg/dL (ref 70–99)
Glucose-Capillary: 95 mg/dL (ref 70–99)
Glucose-Capillary: 126 mg/dL — ABNORMAL HIGH (ref 70–99)

## 2021-03-11 LAB — MAGNESIUM: Magnesium: 1.9 mg/dL (ref 1.7–2.4)

## 2021-03-11 NOTE — Progress Notes (Signed)
Physical Therapy Treatment Patient Details Name: Ryan Cochran. MRN: [id number] DOB: [birth year] Today's Date: 03/11/2021    History of Present Illness 87 y.o. male presenting to ED via [place] [facility] from [place] on 5/6 with SOB, cyanosis of fingers and toes, tachypnea and diarrhea. Noted acute onset of expressive aphasia without weakness or facial droop en route. Code stroke called. Patient not a candidate for tPA 2/2 xarelto. CT no acute abnormality, but small hypodensity right lateral middle cerebral peduncle likely indeterminate age. Per neuro consult, "Etiology for patient symptoms likely encephalopathy" While in CT, patient hypotensive with SBP in 70's to 80's. IV bolus given. Patient diagnosed with primary respiratory alkalosis, hypovolemic shock, and AKI. PMHx significant for A-fib, CAD, DM, BPH, and prostate CA undergoing radiation.    PT Comments    Patient eager to ambulate and states has not walked since 5/9. Initial ambulation without device with mild imbalance requiring up to min assist to recover. Instructed in use of RW and pt much more steady and only required minguard assist. Discussed need for RW at home at this time and would like for HHPT to come to see how he is doing in his home with RW. He is in agreement. RN made aware that pt is eager to ambulate more while hospitalized to try to regain his strength.     Follow Up Recommendations  Home health PT (Discussed and pt in agreement)     Equipment Recommendations  None recommended by PT (pt reports he has a RW at home (was his wife's))    Recommendations for Other Services       Precautions / Restrictions Precautions Precautions: Fall Precaution Comments: High fall risk    Mobility  Bed Mobility                    Transfers Overall transfer level: Needs assistance Equipment used: Rolling walker (2 wheeled);None Transfers: Sit to/from [employer] to Stand: Min guard          General transfer comment: no imbalance; repeated x 3 during session; vc for sequencing with use of RW  Ambulation/Gait Ambulation/Gait assistance: Min guard;Min assist Gait Distance (Feet): 230 Feet (seated rest; 150) Assistive device: Rolling walker (2 wheeled);None Gait Pattern/deviations: Drifts right/left Gait velocity: WNL   General Gait Details: initially walked without device and pt noted to drift left and right (especially with head turns) requiring up to min assist for balance; second walk with RW and pt minguard assist without imbalance (even with head turns)   Marine scientist [place] (Stroke Patients Only)       Balance Overall balance assessment: Needs assistance Sitting-balance support: Feet supported;Single extremity supported Sitting balance-Leahy Scale: Good Sitting balance - Comments: Able to maintain static/dynamic sitting balance at EOB without external assist.   Standing balance support: During functional activity;No upper extremity supported Standing balance-Leahy Scale: Good             [place] - Eyes Closed:  (no sway with single UE support (very light))                Cognition Arousal/Alertness: Awake/alert Behavior During Therapy: WFL for tasks assessed/performed Overall Cognitive Status: Within Functional Limits for tasks assessed  General Comments: A&Ox4.Aware of deficits including neuropathy in feet and decline in strength over last months with diarrhea and weight loss. Aware he was not quite safe without RW and agrees to use RW      Exercises Other Exercises Other Exercises: standing marching x 20; squats x 20; side step taps x 20 for strengthening, balance and endurance    General Comments        Pertinent Vitals/Pain Pain Assessment: No/denies pain    Home Living Family/patient expects to be discharged to:: Private residence ([place]) Living Arrangements: Alone   Type of Home: Independent living facility Home Access: Level entry   Home Layout: One level Home Equipment: Environmental consultant - 2 wheels;Shower seat - built in;Grab bars - tub/shower      Prior Function Level of Independence: Independent      Comments: Independent with ADLs/IADLs; drives; manages meds independently, plays billiards several times a week with friends.   PT Goals (current goals can now be found in the care plan section) Acute Rehab PT Goals Patient Stated Goal: To return home. PT Goal Formulation: With patient Time For Goal Achievement: 03/16/21 Potential to Achieve Goals: Good Progress towards PT goals: Progressing toward goals    Frequency    Min 3X/week      PT Plan Discharge plan needs to be updated    Co-evaluation              AM-PAC PT "6 Clicks" Mobility   Outcome Measure  Help needed turning from your back to your side while in a flat bed without using bedrails?: None Help needed moving from lying on your back to sitting on the side of a flat bed without using bedrails?: None Help needed moving to and from a bed to a chair (including a wheelchair)?: A Little Help needed standing up from a chair using your arms (e.g., wheelchair or bedside chair)?: A Little Help needed to walk in hospital room?: A Little Help needed climbing 3-5 steps with a railing? : A Little 6 Click Score: 20    End of Session Equipment Utilized During Treatment: Gait belt Activity Tolerance: Patient tolerated treatment well Patient left: in chair;with call bell/phone within reach;with chair alarm set Nurse Communication: Mobility status PT Visit Diagnosis: Unsteadiness on feet (R26.81)     Time: 3664-4034 PT Time Calculation (min) (ACUTE ONLY): 21 min  Charges:  $Gait Training: 8-22 mins                      Arby Barrette, PT Pager [phone]    Rexanne Mano 03/11/2021, 10:47 AM

## 2021-03-11 NOTE — Progress Notes (Signed)
PROGRESS NOTE  Ryan Cochran. EI:[id number] DOB: [birth year] DOA: 03/06/2021 PCP: [name], MD  HPI/Recap of past 24 hours: HPI from PCCM 87 y.o. male with hx of Afib, CAD, DM, presents with complaints of labored breathing. Family members were visiting in his independent living facility, called EMS who reported patient to be tachypneic and cyanotic. Pt also demonstrated word searching and garbled speech. In the ED, CODE STROKE was called.  Head CT was negative.  Additionally, he was found to be hypotensive, for which he has been given LR boluses, which resulted in resolution of his neurologic symptoms. Pt reports that he has been struggling with large volume watery diarrhea for the past 5 months, which he associates with the beginning of radiation treatment for prostate cancer.  Been treating it with Imodium and with probiotics without relief. Reports a 30 pound weight loss. PCCM admitted. TRH assumed care on 03/11/21.      Today, pt reports soft stools, denies its watery, denies any abdominal pain, n/v, fever/chills      Assessment/Plan: Principal Problem:   Hypovolemic shock (HCC) Active Problems:   Chronic atrial fibrillation (HCC)   CAD (coronary artery disease)   Long term current use of anticoagulant therapy   Type 2 diabetes mellitus with neurological manifestations, controlled (HCC)   Hypomagnesemia   AKI (acute kidney injury) ([facility])   Intractable diarrhea   Severe dehydration   Lactic acidosis   Normocytic anemia   Hypocalcemia   Pressure injury of skin    Hypotension 2/2 large-volume chronic diarrhea likely 2/2 radiation Currently with soft formed stool Currently afebrile, with no leukocytosis BP still soft Negative infectious work-up-negative C. Difficile, negative GI panel GI consulted, recommend scheduled Lomotil as well as as needed, plan for virtual colonoscopy scheduled on May 17 Continue Creon, Lomotil Continue midodrine  CVA Right lateral middle  cerebral peduncle, unknown chronicity No obvious focal neurologic deficits noted Neurology consulted, appreciate recs Echo unremarkable, carotid Doppler demonstrates b/l ICA 1-39% stenosis Started on statins, continue Xarelto PT/OT/SLP  Anemia of chronic disease/iron deficiency anemia ?New hemoglobin baseline around 10 May start oral iron supplementation, pending GI Daily CBC  Chronic A. Fib Currently rate controlled Continue digoxin, holding beta-blockers/calcium channel blockers for now Echo with biatrial dilatation but preserved EF Continue Xarelto  Diabetes mellitus type 2, controlled Last A1c on 11/13/2020-->5.8 SSI, Accu-Cheks, hypoglycemic protocol     Malnutrition Type:  Nutrition Problem: Increased nutrient needs Etiology: acute illness   Malnutrition Characteristics:  Signs/Symptoms: estimated needs   Nutrition Interventions:  Interventions: Ensure Enlive (each supplement provides 350kcal and 20 grams of protein),Magic cup,MVI    Estimated body mass index is 22.07 kg/m as calculated from the following:   Height as of this encounter: 5\' 9"  (1.753 m).   Weight as of this encounter: 67.8 kg.       Code Status: Full  Family Communication: None at bedside  Disposition Plan: Status is: Inpatient  Remains inpatient appropriate because:Inpatient level of care appropriate due to severity of illness   Dispo: The patient is from: ILF              Anticipated d/c is to: ILF              Patient currently is not medically stable to d/c.   Difficult to place patient No     Consultants:  PCCM  GI  Procedures:  None  Antimicrobials:  None  DVT prophylaxis: Xarelto    Objective: Vitals:  03/11/21 1000 03/11/21 1100 03/11/21 1108 03/11/21 1109  BP: (!) 106/53   (!) 116/56  Pulse: 68 77 70 74  Resp: 18 16  (!) 27  Temp:      TempSrc:      SpO2: 97% 100%  95%  Weight:      Height:        Intake/Output Summary (Last 24 hours) at  03/11/2021 1153 Last data filed at 03/11/2021 1100 Gross per 24 hour  Intake 816.72 ml  Output 565 ml  Net 251.72 ml   Filed Weights   03/06/21 1517 03/07/21 0807 03/08/21 0400  Weight: 65.2 kg 64.3 kg 67.8 kg    Exam:  General: NAD   Cardiovascular: S1, S2 present  Respiratory: CTAB  Abdomen: Soft, nontender, nondistended, bowel sounds present  Musculoskeletal: No bilateral pedal edema noted  Skin: Normal  Psychiatry: Normal mood    Data Reviewed: CBC: Recent Labs  Lab 03/06/21 1457 03/06/21 2113 03/07/21 2224 03/08/21 0950 03/08/21 2203 03/09/21 1135 03/10/21 0203  WBC 6.5   < > 3.6* 4.1 4.6 4.7 5.7  NEUTROABS 4.3  --   --   --   --   --   --   HGB 10.6*   < > 8.8* 9.4* 8.9* 9.3* 8.6*  HCT 31.5*   < > 28.6* 27.9* 27.2* 27.6* 25.7*  MCV 97.2   < > 106.7* 96.5 100.0 97.5 97.3  PLT 164   < > 123* 133* 133* 141* 142*   < > = values in this interval not displayed.   Basic Metabolic Panel: Recent Labs  Lab 03/06/21 2113 03/06/21 2215 03/07/21 0238 03/07/21 1447 03/08/21 0049 03/08/21 1218 03/09/21 0201 03/10/21 0203 03/11/21 0126  NA 136  --    < > 138 136 139 138 136 136  K 4.0  --    < > 4.0 4.0 4.2 4.1 4.3 4.7  CL 115*  --    < > 116* 114* 113* 112* 106 107  CO2 14*  --    < > 19* 14* 21* 22 25 25   GLUCOSE 93  --    < > 120* 86 127* 97 93 98  BUN 65*  --    < > 43* 31* 29* 31* 28* 26*  CREATININE 1.53*  --    < > 1.11 0.75 0.90 0.91 0.74 0.79  CALCIUM 6.2*  --    < > 7.5* 7.0* 7.3* 7.1* 7.3* 7.3*  MG 1.1* 1.7   < > 1.8 1.6*  --  1.9 1.4* 1.9  PHOS 3.3 2.9  --  3.0  --   --   --  2.2*  --    < > = values in this interval not displayed.   GFR: Estimated Creatinine Clearance: 63.6 mL/min (by C-G formula based on SCr of 0.79 mg/dL). Liver Function Tests: Recent Labs  Lab 03/06/21 1457  AST 20  ALT 22  ALKPHOS 47  BILITOT 0.8  PROT 5.9*  ALBUMIN 3.0*   No results for input(s): LIPASE, AMYLASE in the last 168 hours. Recent Labs  Lab  03/06/21 1525  AMMONIA 20   Coagulation Profile: Recent Labs  Lab 03/06/21 1457  INR 2.1*   Cardiac Enzymes: No results for input(s): CKTOTAL, CKMB, CKMBINDEX, TROPONINI in the last 168 hours. BNP (last 3 results) No results for input(s): PROBNP in the last 8760 hours. HbA1C: No results for input(s): HGBA1C in the last 72 hours. CBG: Recent Labs  Lab 03/10/21 0740 03/10/21  1116 03/10/21 1517 03/10/21 2148 03/11/21 0748  GLUCAP 74 102* 131* 113* 86   Lipid Profile: No results for input(s): CHOL, HDL, LDLCALC, TRIG, CHOLHDL, LDLDIRECT in the last 72 hours. Thyroid Function Tests: No results for input(s): TSH, T4TOTAL, FREET4, T3FREE, THYROIDAB in the last 72 hours. Anemia Panel: Recent Labs    03/10/21 1332  VITAMINB12 1,321*  FOLATE 25.9  FERRITIN 160  TIBC 193*  IRON 21*   Urine analysis: No results found for: COLORURINE, APPEARANCEUR, LABSPEC, PHURINE, GLUCOSEU, HGBUR, BILIRUBINUR, KETONESUR, PROTEINUR, UROBILINOGEN, NITRITE, LEUKOCYTESUR Sepsis Labs: @LABRCNTIP (procalcitonin:4,lacticidven:4)  ) Recent Results (from the past 240 hour(s))  C Difficile Quick Screen w PCR reflex     Status: None   Collection Time: 03/06/21  1:15 PM   Specimen: STOOL  Result Value Ref Range Status   C Diff antigen NEGATIVE NEGATIVE Final   C Diff toxin NEGATIVE NEGATIVE Final   C Diff interpretation No C. difficile detected.  Final    Comment: Performed at [facility], [place] [address] [place], [place] [address]  Gastrointestinal Panel by PCR , Stool     Status: None   Collection Time: 03/06/21  1:15 PM   Specimen: Stool  Result Value Ref Range Status   Campylobacter species NOT DETECTED NOT DETECTED Final   Plesimonas shigelloides NOT DETECTED NOT DETECTED Final   Salmonella species NOT DETECTED NOT DETECTED Final   Yersinia enterocolitica NOT DETECTED NOT DETECTED Final   Vibrio species NOT DETECTED NOT DETECTED Final   Vibrio cholerae NOT DETECTED NOT DETECTED  Final   Enteroaggregative E coli (EAEC) NOT DETECTED NOT DETECTED Final   Enteropathogenic E coli (EPEC) NOT DETECTED NOT DETECTED Final   Enterotoxigenic E coli (ETEC) NOT DETECTED NOT DETECTED Final   Shiga like toxin producing E coli (STEC) NOT DETECTED NOT DETECTED Final   Shigella/Enteroinvasive E coli (EIEC) NOT DETECTED NOT DETECTED Final   Cryptosporidium NOT DETECTED NOT DETECTED Final   Cyclospora cayetanensis NOT DETECTED NOT DETECTED Final   Entamoeba histolytica NOT DETECTED NOT DETECTED Final   Giardia lamblia NOT DETECTED NOT DETECTED Final   Adenovirus F40/41 NOT DETECTED NOT DETECTED Final   Astrovirus NOT DETECTED NOT DETECTED Final   Norovirus GI/GII NOT DETECTED NOT DETECTED Final   Rotavirus A NOT DETECTED NOT DETECTED Final   Sapovirus (I, II, IV, and V) NOT DETECTED NOT DETECTED Final    Comment: Performed at [facility], [address] [place], [place] [address]  Resp Panel by RT-PCR (Flu A&B, Covid) Nasopharyngeal Swab     Status: None   Collection Time: 03/06/21  3:25 PM   Specimen: Nasopharyngeal Swab; Nasopharyngeal(NP) swabs in vial transport medium  Result Value Ref Range Status   SARS Coronavirus 2 by RT PCR NEGATIVE NEGATIVE Final    Comment: (NOTE) SARS-CoV-2 target nucleic acids are NOT DETECTED.  The SARS-CoV-2 RNA is generally detectable in upper respiratory specimens during the acute phase of infection. The lowest concentration of SARS-CoV-2 viral copies this assay can detect is 138 copies/mL. A negative result does not preclude SARS-Cov-2 infection and should not be used as the sole basis for treatment or other patient management decisions. A negative result may occur with  improper specimen collection/handling, submission of specimen other than nasopharyngeal swab, presence of viral mutation(s) within the areas targeted by this assay, and inadequate number of viral copies(<138 copies/mL). A negative result must be combined  with clinical observations, patient history, and epidemiological information. The expected result is Negative.  Fact Sheet for Patients:  EntrepreneurPulse.com.au  Fact Sheet for Healthcare Providers:  IncredibleEmployment.be  This test is no t yet approved or cleared by the [place] FDA and  has been authorized for detection and/or diagnosis of SARS-CoV-2 by FDA under an Emergency Use Authorization (EUA). This EUA will remain  in effect (meaning this test can be used) for the duration of the COVID-19 declaration under Section 564(b)(1) of the Act, 21 U.S.C.section 360bbb-3(b)(1), unless the authorization is terminated  or revoked sooner.       Influenza A by PCR NEGATIVE NEGATIVE Final   Influenza B by PCR NEGATIVE NEGATIVE Final    Comment: (NOTE) The Xpert Xpress SARS-CoV-2/FLU/RSV plus assay is intended as an aid in the diagnosis of influenza from Nasopharyngeal swab specimens and should not be used as a sole basis for treatment. Nasal washings and aspirates are unacceptable for Xpert Xpress SARS-CoV-2/FLU/RSV testing.  Fact Sheet for Patients: EntrepreneurPulse.com.au  Fact Sheet for Healthcare Providers: IncredibleEmployment.be  This test is not yet approved or cleared by the [place] FDA and has been authorized for detection and/or diagnosis of SARS-CoV-2 by FDA under an Emergency Use Authorization (EUA). This EUA will remain in effect (meaning this test can be used) for the duration of the COVID-19 declaration under Section 564(b)(1) of the Act, 21 U.S.C. section 360bbb-3(b)(1), unless the authorization is terminated or revoked.  Performed at [facility], [place] [address] [place], [place] [address]   MRSA PCR Screening     Status: None   Collection Time: 03/07/21  8:20 AM   Specimen: Nasopharyngeal  Result Value Ref Range Status   MRSA by PCR NEGATIVE NEGATIVE Final     Comment:        The GeneXpert MRSA Assay (FDA approved for NASAL specimens only), is one component of a comprehensive MRSA colonization surveillance program. It is not intended to diagnose MRSA infection nor to guide or monitor treatment for MRSA infections. Performed at [facility], [place] [address], [place], [place] [address]       Studies: No results found.  Scheduled Meds: . atorvastatin  10 mg Oral QHS  . Chlorhexidine Gluconate Cloth  6 each Topical Daily  . digoxin  0.125 mg Oral Daily  . diphenoxylate-atropine  1 tablet Oral BID  . feeding supplement  237 mL Oral TID BM  . finasteride  5 mg Oral QHS  . lipase/protease/amylase  24,000 Units Oral TID AC  . midodrine  10 mg Oral TID WC  . multivitamin with minerals  1 tablet Oral Daily  . potassium chloride  20 mEq Oral QHS  . rivaroxaban  20 mg Oral QAC supper  . tamsulosin  0.4 mg Oral Daily    Continuous Infusions:   LOS: 5 days     Alma Friendly, MD [facility]  If 7PM-7AM, please contact night-coverage [email or web address] 03/11/2021, 11:53 AM

## 2021-03-11 NOTE — Progress Notes (Signed)
Occupational Therapy Treatment Patient Details Name: Ryan Cochran. MRN: [id number] DOB: 20-Dec-1932 Today's Date: 03/11/2021    History of present illness 87 y.o. male presenting to ED via [facility] from [place] on 5/6 with SOB, cyanosis of fingers and toes, tachypnea and diarrhea. Noted acute onset of expressive aphasia without weakness or facial droop en route. Code stroke called. Patient not a candidate for tPA 2/2 xarelto. CT no acute abnormality, but small hypodensity right lateral middle cerebral peduncle likely indeterminate age. Per neuro consult, "Etiology for patient symptoms likely encephalopathy" While in CT, patient hypotensive with SBP in 70's to 80's. IV bolus given. Patient diagnosed with primary respiratory alkalosis, hypovolemic shock, and AKI. PMHx significant for A-fib, CAD, DM, BPH, and prostate CA undergoing radiation.   OT comments  Patient progressing well toward goals. Session with focus on self-care re-education and safety with RW. Patient able to return demo hand placement on RW, safety with sit <> stand transfers, and safety with RW during ADLs. Would benefit from continued education. Spike in HR to 130's during grooming tasks standing at sink level. Patient in no apparent distress.    Follow Up Recommendations  Home health OT;Supervision - Intermittent    Equipment Recommendations  None recommended by OT    Recommendations for Other Services      Precautions / Restrictions Precautions Precautions: Fall Precaution Comments: High fall risk Restrictions Weight Bearing Restrictions: No       Mobility Bed Mobility               General bed mobility comments: Patient seated in recliner upon entry.    Transfers Overall transfer level: Needs assistance Equipment used: Rolling walker (2 wheeled);None Transfers: Sit to/from [employer] to Stand: Min guard         General transfer comment: Min guard x2 trials. Good  return demo of hand placement.    Balance Overall balance assessment: Needs assistance Sitting-balance support: Feet supported;Single extremity supported Sitting balance-Leahy Scale: Good Sitting balance - Comments: Able to maintain static/dynamic sitting balance at EOB without external assist.   Standing balance support: During functional activity;No upper extremity supported Standing balance-Leahy Scale: Good             [place] - Eyes Closed:  (no sway with single UE support (very light))               ADL either performed or assessed with clinical judgement   ADL Overall ADL's : Needs assistance/impaired     Grooming: Wash/dry hands;Oral care;Standing Grooming Details (indicate cue type and reason): 2/3 grooming tasks standing at sink level with Min gaurd and RW. HR elevated to 130's standing at sink.                 Toilet Transfer: Designer, television/film set Details (indicate cue type and reason): Simualted with transfer to recliner with Min guard and use of RW.         Functional mobility during ADLs: Min guard;Rolling walker General ADL Comments: Progressing well toward goals.     Vision       Perception     Praxis      Cognition Arousal/Alertness: Awake/alert Behavior During Therapy: WFL for tasks assessed/performed Overall Cognitive Status: Within Functional Limits for tasks assessed  General Comments: A&Ox4. Aware of deficits including neuropathy in feet and decline in strength over last months with diarrhea and weight loss. Aware he was not quite safe without RW and agrees to use RW        Exercises Exercises: Other exercises Other Exercises Other Exercises: standing marching x 20; squats x 20; side step taps x 20 for strengthening, balance and endurance   Shoulder Instructions       General Comments HR elevated to 130's during grooming tasks standing at sink level. Patient in no  apparent distress.    Pertinent Vitals/ Pain       Pain Assessment: No/denies pain  Home Living Family/patient expects to be discharged to:: Private residence ([place]) Living Arrangements: Alone   Type of Home: Independent living facility Home Access: Level entry     Home Layout: One level     Bathroom Shower/Tub: Walk-in Psychologist, prison and probation services: Handicapped height     Home Equipment: Environmental consultant - 2 wheels;Shower seat - built in;Grab bars - tub/shower      Lives With: Alone    Prior Functioning/Environment Level of Independence: Independent        Comments: Independent with ADLs/IADLs; drives; manages meds independently, plays billiards several times a week with friends.   Frequency  Min 2X/week        Progress Toward Goals  OT Goals(current goals can now be found in the care plan section)  Progress towards OT goals: Progressing toward goals  Acute Rehab OT Goals Patient Stated Goal: To return home. OT Goal Formulation: With patient Time For Goal Achievement: 03/23/21 Potential to Achieve Goals: Good ADL Goals Pt Will Perform Grooming: with modified independence;standing Pt Will Perform Upper Body Dressing: with modified independence;standing Pt Will Perform Lower Body Dressing: with modified independence;sit to/from stand Pt Will Transfer to Toilet: with modified independence;ambulating Pt Will Perform Toileting - Clothing Manipulation and hygiene: with modified independence;sit to/from stand Additional ADL Goal #1: Patient will complete ADL tranfsers with Mod I and AD demonstrating good safety awareness.  Plan Discharge plan remains appropriate;Discharge plan needs to be updated    Co-evaluation                 AM-PAC OT "6 Clicks" Daily Activity     Outcome Measure   Help from another person eating meals?: None Help from another person taking care of personal grooming?: A Little Help from another person toileting, which includes using  toliet, bedpan, or urinal?: A Little Help from another person bathing (including washing, rinsing, drying)?: A Little Help from another person to put on and taking off regular upper body clothing?: A Little Help from another person to put on and taking off regular lower body clothing?: A Little 6 Click Score: 19    End of Session Equipment Utilized During Treatment: Gait belt;Rolling walker  OT Visit Diagnosis: Unsteadiness on feet (R26.81)   Activity Tolerance Patient tolerated treatment well   Patient Left in chair;with call bell/phone within reach;with chair alarm set   Nurse Communication Mobility status        Time: 5284-1324 OT Time Calculation (min): 22 min  Charges: OT General Charges $OT Visit: 1 Visit OT Treatments $Self Care/Home Management : 8-22 mins  Ivan Lacher H. OTR/L Supplemental OT, Department of rehab services [phone]   Maricela Kawahara R H. 03/11/2021, 12:50 PM

## 2021-03-12 DIAGNOSIS — E872 Acidosis: Secondary | ICD-10-CM

## 2021-03-12 DIAGNOSIS — I2510 Atherosclerotic heart disease of native coronary artery without angina pectoris: Secondary | ICD-10-CM

## 2021-03-12 LAB — CBC WITH DIFFERENTIAL/PLATELET
Abs Immature Granulocytes: 0.03 10*3/uL (ref 0.00–0.07)
Basophils Absolute: 0 10*3/uL (ref 0.0–0.1)
Basophils Relative: 1 %
Eosinophils Absolute: 0.2 10*3/uL (ref 0.0–0.5)
Eosinophils Relative: 4 %
HCT: 25.2 % — ABNORMAL LOW (ref 39.0–52.0)
Hemoglobin: 8.6 g/dL — ABNORMAL LOW (ref 13.0–17.0)
Immature Granulocytes: 1 %
Lymphocytes Relative: 12 %
Lymphs Abs: 0.8 10*3/uL (ref 0.7–4.0)
MCH: 33.2 pg (ref 26.0–34.0)
MCHC: 34.1 g/dL (ref 30.0–36.0)
MCV: 97.3 fL (ref 80.0–100.0)
Monocytes Absolute: 0.8 10*3/uL (ref 0.1–1.0)
Monocytes Relative: 12 %
Neutro Abs: 4.6 10*3/uL (ref 1.7–7.7)
Neutrophils Relative %: 70 %
Platelets: 156 10*3/uL (ref 150–400)
RBC: 2.59 MIL/uL — ABNORMAL LOW (ref 4.22–5.81)
RDW: 13.5 % (ref 11.5–15.5)
WBC: 6.5 10*3/uL (ref 4.0–10.5)
nRBC: 0 % (ref 0.0–0.2)

## 2021-03-12 LAB — BASIC METABOLIC PANEL
Anion gap: 2 — ABNORMAL LOW (ref 5–15)
BUN: 25 mg/dL — ABNORMAL HIGH (ref 8–23)
CO2: 26 mmol/L (ref 22–32)
Calcium: 7.3 mg/dL — ABNORMAL LOW (ref 8.9–10.3)
Chloride: 107 mmol/L (ref 98–111)
Creatinine, Ser: 0.81 mg/dL (ref 0.61–1.24)
GFR, Estimated: >60 mL/min (ref >60)
Glucose, Bld: 105 mg/dL — ABNORMAL HIGH (ref 70–99)
Potassium: 4.6 mmol/L (ref 3.5–5.1)
Sodium: 135 mmol/L (ref 135–145)

## 2021-03-12 LAB — GLUCOSE, CAPILLARY
Glucose-Capillary: 85 mg/dL (ref 70–99)
Glucose-Capillary: 123 mg/dL — ABNORMAL HIGH (ref 70–99)
Glucose-Capillary: 135 mg/dL — ABNORMAL HIGH (ref 70–99)

## 2021-03-12 LAB — CBC
HCT: 28.6 % — ABNORMAL LOW (ref 39.0–52.0)
Hemoglobin: 9.5 g/dL — ABNORMAL LOW (ref 13.0–17.0)
MCH: 32.8 pg (ref 26.0–34.0)
MCHC: 33.2 g/dL (ref 30.0–36.0)
MCV: 98.6 fL (ref 80.0–100.0)
Platelets: 179 10*3/uL (ref 150–400)
RBC: 2.9 MIL/uL — ABNORMAL LOW (ref 4.22–5.81)
RDW: 13.5 % (ref 11.5–15.5)
WBC: 5.6 10*3/uL (ref 4.0–10.5)
nRBC: 0 % (ref 0.0–0.2)

## 2021-03-12 MED ORDER — DIPHENOXYLATE-ATROPINE 2.5-0.025 MG PO TABS
1.0000 | ORAL_TABLET | ORAL | 0 refills | Status: DC | PRN
Start: 2021-03-12 — End: 2021-03-26

## 2021-03-12 MED ORDER — FERROUS SULFATE 325 (65 FE) MG PO TABS
325.0000 mg | ORAL_TABLET | Freq: Every day | ORAL | Status: DC
  Administered 2021-03-12: 325 mg via ORAL
  Filled 2021-03-12: qty 1

## 2021-03-12 MED ORDER — ADULT MULTIVITAMIN W/MINERALS CH: 1.0000 | ORAL_TABLET | Freq: Every day | ORAL | 0 refills | Status: DC

## 2021-03-12 MED ORDER — ENSURE ENLIVE PO LIQD: 237.0000 mL | Freq: Three times a day (TID) | ORAL | 12 refills | Status: DC

## 2021-03-12 MED ORDER — MIDODRINE HCL 10 MG PO TABS
10.0000 mg | ORAL_TABLET | Freq: Three times a day (TID) | ORAL | 0 refills | Status: DC
Start: 2021-03-12 — End: 2021-03-26

## 2021-03-12 MED ORDER — FERROUS SULFATE 325 (65 FE) MG PO TABS: 325.0000 mg | ORAL_TABLET | Freq: Every day | ORAL | 0 refills | Status: DC

## 2021-03-12 MED ORDER — PANCRELIPASE (LIP-PROT-AMYL) 24000-76000 UNITS PO CPEP: 24000.0000 [IU] | ORAL_CAPSULE | Freq: Three times a day (TID) | ORAL | 0 refills | Status: DC

## 2021-03-12 MED ORDER — DIPHENOXYLATE-ATROPINE 2.5-0.025 MG PO TABS: 1.0000 | ORAL_TABLET | Freq: Two times a day (BID) | ORAL | 0 refills | Status: DC

## 2021-03-12 NOTE — Plan of Care (Signed)
  Problem: Education: Goal: Knowledge of General Education information will improve Description: Including pain rating scale, medication(s)/side effects and non-pharmacologic comfort measures Outcome: Adequate for Discharge   Problem: Health Behavior/Discharge Planning: Goal: Ability to manage health-related needs will improve Outcome: Adequate for Discharge   Problem: Clinical Measurements: Goal: Ability to maintain clinical measurements within normal limits will improve Outcome: Adequate for Discharge Goal: Will remain free from infection Outcome: Adequate for Discharge Goal: Diagnostic test results will improve Outcome: Adequate for Discharge Goal: Respiratory complications will improve Outcome: Adequate for Discharge Goal: Cardiovascular complication will be avoided Outcome: Adequate for Discharge   Problem: Activity: Goal: Risk for activity intolerance will decrease Outcome: Adequate for Discharge   Problem: Nutrition: Goal: Adequate nutrition will be maintained Outcome: Adequate for Discharge   Problem: Coping: Goal: Level of anxiety will decrease Outcome: Adequate for Discharge   Problem: Elimination: Goal: Will not experience complications related to bowel motility Outcome: Adequate for Discharge Goal: Will not experience complications related to urinary retention Outcome: Adequate for Discharge   Problem: Pain Managment: Goal: General experience of comfort will improve Outcome: Adequate for Discharge   Problem: Safety: Goal: Ability to remain free from injury will improve Outcome: Adequate for Discharge   Problem: Skin Integrity: Goal: Risk for impaired skin integrity will decrease Outcome: Adequate for Discharge   Problem: Education: Goal: Knowledge of secondary prevention will improve Outcome: Adequate for Discharge

## 2021-03-12 NOTE — Discharge Summary (Signed)
Discharge Summary  Ryan Cochran. EI:[id number] DOB: Dec 31, 1932  PCP: [name], MD  Admit date: 03/06/2021 Discharge date: 03/12/2021  Time spent: 40 mins  Recommendations for Outpatient Follow-up:  1. PCP 1 week 2. GI as scheduled  Discharge Diagnoses:  Active Hospital Problems   Diagnosis Date Noted  . Hypovolemic shock ([place]) 03/06/2021  . Pressure injury of skin 03/08/2021  . Hypocalcemia 03/07/2021  . Hypomagnesemia 03/06/2021  . AKI (acute kidney injury) ([place]) 03/06/2021  . Intractable diarrhea 03/06/2021  . Severe dehydration 03/06/2021  . Lactic acidosis 03/06/2021  . Normocytic anemia 03/06/2021  . Type 2 diabetes mellitus with neurological manifestations, controlled ([place]) 06/29/2017  . Long term current use of anticoagulant therapy 02/21/2017  . Chronic atrial fibrillation ([place])   . CAD (coronary artery disease)     Resolved Hospital Problems  No resolved problems to display.    Discharge Condition: Stable  Diet recommendation: Heart healthy  Vitals:   03/12/21 1415 03/12/21 1500  BP: (!) 100/53 (!) 106/59  Pulse: 86 77  Resp: 16 20  Temp:    SpO2: 97% 96%    History of present illness:  87 y.o.male with hx of Afib, CAD, DM, presents with complaints of labored breathing. Family members were visiting in his independent living facility, called EMS who reported patient to be tachypneic and cyanotic. Pt also demonstrated word searching and garbled speech. In the ED, [facility] was called. Head CT was negative. Additionally, he was found to be hypotensive, for which he has been given LR boluses, which resulted in resolution of his neurologic symptoms. Pt reports that he has been struggling with large volume watery diarrhea for the past 5 months, which he associates with the beginning of radiation treatment for prostate cancer. Been treating it with Imodium and with probiotics without relief. Reports a 30 pound weight loss. PCCM admitted. [facility] assumed  care on 03/11/21.    Today, patient was noted to have an episode of loose stool with some blood mixed in it, reports he happens occasionally due to history of hemorrhoids.  Repeat CBC with stable, vital signs were also stable.  Patient very eager to be discharged, has very close follow-up with GI, with a virtual colonoscopy scheduled Mar 17, 2021. Patient has ambulated the hallway frequently with no issues.  Patient denies any abdominal pain, nausea/vomiting, fever/chills, chest pain, shortness of breath.   Hospital Course:  Principal Problem:   Hypovolemic shock ([place]) Active Problems:   Chronic atrial fibrillation ([facility])   CAD (coronary artery disease)   Long term current use of anticoagulant therapy   Type 2 diabetes mellitus with neurological manifestations, controlled ([facility])   Hypomagnesemia   AKI (acute kidney injury) ([place])   Intractable diarrhea   Severe dehydration   Lactic acidosis   Normocytic anemia   Hypocalcemia   Pressure injury of skin   Hypotension 2/2 large-volume chronic diarrhea likely 2/2 radiation Currently with soft stool Currently afebrile, with no leukocytosis BP still soft Negative infectious work-up-negative C. Difficile, negative GI panel GI consulted, recommend scheduled Lomotil as well as as needed, plan for virtual colonoscopy scheduled on May 17 Continue Creon, Lomotil Continue midodrine Follow-up with GI  CVA Right lateral middle cerebral peduncle, unknown chronicity No obvious focal neurologic deficits noted Neurology consulted, appreciate recs Echo unremarkable, carotid Doppler demonstrates b/l ICA 1-39% stenosis Started on statins, continue Xarelto PT/OT/SLP-no further recs Follow-up with PCP  Anemia of chronic disease/iron deficiency anemia ?New hemoglobin baseline around 10 Oral iron  supplementation Follow-up with GI, PCP  Chronic A. Fib Currently rate controlled Continue digoxin Echo with biatrial dilatation but preserved  EF Continue Xarelto  Diabetes mellitus type 2, controlled Last A1c on 11/13/2020-->5.8 Continue home regimen     Malnutrition Type:  Nutrition Problem: Increased nutrient needs Etiology: acute illness   Malnutrition Characteristics:  Signs/Symptoms: estimated needs   Nutrition Interventions:  Interventions: Ensure Enlive (each supplement provides 350kcal and 20 grams of protein),Magic cup,MVI   Estimated body mass index is 23.96 kg/m as calculated from the following:   Height as of this encounter: 5\' 9"  (1.753 m).   Weight as of this encounter: 73.6 kg.    Procedures:  None  Consultations:  PCCM  GI    Discharge Exam: BP (!) 106/59   Pulse 77   Temp (!) 97.2 F (36.2 C) (Oral)   Resp 20   Ht 5\' 9"  (1.753 m)   Wt 73.6 kg   SpO2 96%   BMI 23.96 kg/m   General: NAD Cardiovascular: S1, S2 present Respiratory: CTA B Abdomen: Soft, nontender, nondistended, bowel sounds present    Discharge Instructions You were cared for by a hospitalist during your hospital stay. If you have any questions about your discharge medications or the care you received while you were in the hospital after you are discharged, you can call the unit and asked to speak with the hospitalist on call if the hospitalist that took care of you is not available. Once you are discharged, your primary care physician will handle any further medical issues. Please note that NO REFILLS for any discharge medications will be authorized once you are discharged, as it is imperative that you return to your primary care physician (or establish a relationship with a primary care physician if you do not have one) for your aftercare needs so that they can reassess your need for medications and monitor your lab values.  Discharge Instructions    Diet - low sodium heart healthy   Complete by: As directed    Discharge wound care:   Complete by: As directed    As discussed/planned   Increase activity  slowly   Complete by: As directed      Allergies as of 03/12/2021   No Known Allergies     Medication List    STOP taking these medications   IMODIUM A-D PO   lipase/protease/amylase 36000 UNITS Cpep capsule Commonly known as: Creon Replaced by: Pancrelipase (Lip-Prot-Amyl) 24000-76000 units Cpep   olmesartan 40 MG tablet Commonly known as: BENICAR   rifaximin 550 MG Tabs tablet Commonly known as: XIFAXAN     TAKE these medications   acetaminophen 500 MG tablet Commonly known as: TYLENOL Take 500 mg by mouth every 6 (six) hours as needed for moderate pain or mild pain.   atorvastatin 10 MG tablet Commonly known as: LIPITOR TAKE 1 TABLET BY MOUTH DAILY What changed:   how much to take  how to take this  when to take this  additional instructions   digoxin 0.125 MG tablet Commonly known as: LANOXIN TAKE 1 TABLET BY MOUTH DAILY What changed: when to take this   diphenoxylate-atropine 2.5-0.025 MG tablet Commonly known as: LOMOTIL Take 1 tablet by mouth 2 (two) times daily. What changed:   when to take this  reasons to take this   diphenoxylate-atropine 2.5-0.025 MG tablet Commonly known as: LOMOTIL Take 1 tablet by mouth every 4 (four) hours as needed for diarrhea or loose stools. What changed: You  were already taking a medication with the same name, and this prescription was added. Make sure you understand how and when to take each.   feeding supplement Liqd Take 237 mLs by mouth 3 (three) times daily between meals.   ferrous sulfate 325 (65 FE) MG tablet Take 1 tablet (325 mg total) by mouth daily with breakfast. Start taking on: Mar 13, 2021   finasteride 5 MG tablet Commonly known as: PROSCAR Take 5 mg by mouth at bedtime.   midodrine 10 MG tablet Commonly known as: PROAMATINE Take 1 tablet (10 mg total) by mouth 3 (three) times daily with meals.   multivitamin with minerals Tabs tablet Take 1 tablet by mouth daily. Start taking on: Mar 13, 2021   Pancrelipase (Lip-Prot-Amyl) 24000-76000 units Cpep Take 1 capsule (24,000 Units total) by mouth 3 (three) times daily before meals. Replaces: lipase/protease/amylase 36000 UNITS Cpep capsule   tamsulosin 0.4 MG Caps capsule Commonly known as: FLOMAX Take 0.4 mg by mouth daily.   tolterodine 4 MG 24 hr capsule Commonly known as: DETROL LA Take 4 mg by mouth daily.   Xarelto 20 MG Tabs tablet Generic drug: rivaroxaban TAKE 1 TABLET(20 MG) BY MOUTH DAILY What changed: See the new instructions.            Discharge Care Instructions  (From admission, onward)         Start     Ordered   03/12/21 0000  Discharge wound care:       Comments: As discussed/planned   03/12/21 1039         No Known Allergies  Follow-up Information    [name], MD. Schedule an appointment as soon as possible for a visit in 1 week(s).   Specialty: Internal Medicine Contact information: [place] [phone]        Jerene Bears, MD Follow up.   Specialty: Gastroenterology Why: Follow up Contact information: [address] [place] [address] [phone]                The results of significant diagnostics from this hospitalization (including imaging, microbiology, ancillary and laboratory) are listed below for reference.    Significant Diagnostic Studies: DG Chest Port 1 View  Result Date: 03/06/2021 CLINICAL DATA:  Shortness of breath EXAM: PORTABLE CHEST 1 VIEW COMPARISON:  None. FINDINGS: The heart size and mediastinal contours are within normal limits. Both lungs are clear. No pleural effusion or pneumothorax. The visualized skeletal structures are unremarkable. IMPRESSION: No acute process in the chest. Electronically Signed   By: Macy Mis M.D.   On: 03/06/2021 15:53   ECHOCARDIOGRAM COMPLETE  Result Date: 03/08/2021    ECHOCARDIOGRAM REPORT   Patient Name:   Devinn Hurwitz. Date of Exam: 03/08/2021 Medical Rec  #:  [id number]        Height:       69.0 in Accession #:    [id number]       Weight:       149.5 lb Date of Birth:  10-07-1933         BSA:          1.825 m Patient Age:    57 years         BP:           93/32 mmHg Patient Gender: M                HR:  59 bpm. Exam Location:  Inpatient Procedure: 2D Echo Indications:    stroke  History:        Patient has prior history of Echocardiogram examinations, most                 recent 03/06/2020. CAD, Arrythmias:Atrial Fibrillation; Risk                 Factors:Dyslipidemia and Diabetes.  Sonographer:    Johny Chess Referring Phys: [id number] [place]  1. Left ventricular ejection fraction, by estimation, is 60 to 65%. The left ventricle has normal function. The left ventricle has no regional wall motion abnormalities. Left ventricular diastolic function could not be evaluated.  2. Right ventricular systolic function is normal. The right ventricular size is mildly enlarged. There is normal pulmonary artery systolic pressure.  3. Left atrial size was moderately dilated.  4. Right atrial size was mildly dilated.  5. The mitral valve is normal in structure. Trivial mitral valve regurgitation. No evidence of mitral stenosis.  6. The aortic valve is normal in structure. Aortic valve regurgitation is mild. No aortic stenosis is present.  7. The inferior vena cava is normal in size with greater than 50% respiratory variability, suggesting right atrial pressure of 3 mmHg. Comparison(s): No significant change from prior study. Prior images reviewed side by side. FINDINGS  Left Ventricle: Left ventricular ejection fraction, by estimation, is 60 to 65%. The left ventricle has normal function. The left ventricle has no regional wall motion abnormalities. The left ventricular internal cavity size was normal in size. There is  no left ventricular hypertrophy. Left ventricular diastolic function could not be evaluated due to atrial fibrillation. Left  ventricular diastolic function could not be evaluated. Right Ventricle: The right ventricular size is mildly enlarged. No increase in right ventricular wall thickness. Right ventricular systolic function is normal. There is normal pulmonary artery systolic pressure. The tricuspid regurgitant velocity is 2.31  m/s, and with an assumed right atrial pressure of 3 mmHg, the estimated right ventricular systolic pressure is 45.4 mmHg. Left Atrium: Left atrial size was moderately dilated. Right Atrium: Right atrial size was mildly dilated. Pericardium: There is no evidence of pericardial effusion. Mitral Valve: The mitral valve is normal in structure. Trivial mitral valve regurgitation. No evidence of mitral valve stenosis. Tricuspid Valve: The tricuspid valve is normal in structure. Tricuspid valve regurgitation is mild . No evidence of tricuspid stenosis. Aortic Valve: The aortic valve is normal in structure. Aortic valve regurgitation is mild. No aortic stenosis is present. Pulmonic Valve: The pulmonic valve was normal in structure. Pulmonic valve regurgitation is not visualized. No evidence of pulmonic stenosis. Aorta: The aortic root is normal in size and structure. Venous: The inferior vena cava is normal in size with greater than 50% respiratory variability, suggesting right atrial pressure of 3 mmHg. IAS/Shunts: No atrial level shunt detected by color flow Doppler.  LEFT VENTRICLE PLAX 2D LVIDd:         4.30 cm LVIDs:         2.70 cm LV PW:         0.90 cm LV IVS:        0.80 cm LVOT diam:     1.90 cm LVOT Area:     2.84 cm  RIGHT VENTRICLE             IVC RV S prime:     18.80 cm/s  IVC diam: 1.20 cm TAPSE (M-mode): 2.2 cm LEFT  ATRIUM             Index       RIGHT ATRIUM           Index LA diam:        4.30 cm 2.36 cm/m  RA Area:     19.80 cm LA Vol (A2C):   72.6 ml 39.77 ml/m RA Volume:   50.40 ml  27.61 ml/m LA Vol (A4C):   83.2 ml 45.58 ml/m LA Biplane Vol: 82.6 ml 45.25 ml/m   AORTA Ao Root diam: 3.30  cm Ao Asc diam:  3.10 cm TRICUSPID VALVE TR Peak grad:   21.3 mmHg TR Vmax:        231.00 cm/s  SHUNTS Systemic Diam: 1.90 cm Dani Gobble Croitoru MD Electronically signed by Sanda Klein MD Signature Date/Time: 03/08/2021/4:29:41 PM    Final    CT HEAD CODE STROKE WO CONTRAST  Result Date: 03/06/2021 CLINICAL DATA:  Code stroke.  Acute neuro deficit. EXAM: CT HEAD WITHOUT CONTRAST TECHNIQUE: Contiguous axial images were obtained from the base of the skull through the vertex without intravenous contrast. COMPARISON:  None. FINDINGS: Brain: Mild atrophy. Negative for hydrocephalus. Negative for hemorrhage or mass. Small hypodensity right middle cerebral peduncle laterally most likely infarct of indeterminate age. Vascular: Negative for hyperdense vessel Skull: Negative Sinuses/Orbits: Mucosal edema paranasal sinuses. Bilateral cataract extraction Other: None ASPECTS ([place] Stroke Program Early CT Score) - Ganglionic level infarction (caudate, lentiform nuclei, internal capsule, insula, M1-M3 cortex): 7 - Supraganglionic infarction (M4-M6 cortex): 3 Total score (0-10 with 10 being normal): 10 IMPRESSION: 1. Small hypodensity right lateral middle cerebral peduncle. Likely infarct of indeterminate age. 2. No other acute infarct or hemorrhage. 3. ASPECTS is 10 4. Code stroke imaging results were communicated on 03/06/2021 at 3:03 pm to provider Erlinda Hong via text page Electronically Signed   By: Franchot Gallo M.D.   On: 03/06/2021 15:03   VAS US CAROTID  Result Date: 03/09/2021 Carotid Arterial Duplex Study Patient Name:  Denardo Higgs.  Date of Exam:   03/07/2021 Medical Rec #: [id number]         Accession #:    [id number] Date of Birth: [birth year]          Patient Gender: M Patient Age:   88Y Exam Location:  [facility] Procedure:      VAS US CAROTID Referring Phys: [id number] [place] --------------------------------------------------------------------------------  Indications:      CVA. Risk Factors:      Hypertension, hyperlipidemia, Diabetes, coronary artery                   disease. Comparison Study: No prior study Performing Technologist: Maudry Mayhew MHA, RDMS, RVT, RDCS  Examination Guidelines: A complete evaluation includes B-mode imaging, spectral Doppler, color Doppler, and power Doppler as needed of all accessible portions of each vessel. Bilateral testing is considered an integral part of a complete examination. Limited examinations for reoccurring indications may be performed as noted.  Right Carotid Findings: +----------+--------+--------+--------+-----------------------+--------+           PSV cm/sEDV cm/sStenosisPlaque Description     Comments +----------+--------+--------+--------+-----------------------+--------+ CCA Prox  83      8                                               +----------+--------+--------+--------+-----------------------+--------+ CCA Distal93  12                                              +----------+--------+--------+--------+-----------------------+--------+ ICA Prox  66      16              heterogenous                    +----------+--------+--------+--------+-----------------------+--------+ ICA Distal115     29                                              +----------+--------+--------+--------+-----------------------+--------+ ECA       91      9               heterogenous and smooth         +----------+--------+--------+--------+-----------------------+--------+ +----------+--------+-------+----------------+-------------------+           PSV cm/sEDV cmsDescribe        Arm Pressure (mmHG) +----------+--------+-------+----------------+-------------------+ [id number]            Multiphasic, WNL                    +----------+--------+-------+----------------+-------------------+ +---------+--------+--+--------+--+---------+ VertebralPSV cm/s67EDV cm/s15Antegrade  +---------+--------+--+--------+--+---------+  Left Carotid Findings: +----------+--------+--------+--------+--------------------------+--------+           PSV cm/sEDV cm/sStenosisPlaque Description        Comments +----------+--------+--------+--------+--------------------------+--------+ CCA Prox  102     13                                                 +----------+--------+--------+--------+--------------------------+--------+ CCA Distal76      15              heterogenous and irregular         +----------+--------+--------+--------+--------------------------+--------+ ICA Prox  170     32              smooth and heterogenous            +----------+--------+--------+--------+--------------------------+--------+ ICA Distal101     33                                                 +----------+--------+--------+--------+--------------------------+--------+ ECA       118                                                        +----------+--------+--------+--------+--------------------------+--------+ +----------+--------+--------+----------------+-------------------+           PSV cm/sEDV cm/sDescribe        Arm Pressure (mmHG) +----------+--------+--------+----------------+-------------------+ Subclavian190             Multiphasic, WNL                    +----------+--------+--------+----------------+-------------------+ +---------+--------+--+--------+--+---------+ VertebralPSV cm/s75EDV cm/s19Antegrade +---------+--------+--+--------+--+---------+   Summary: Right Carotid: Velocities in the right ICA  are consistent with a 1-39% stenosis. Left Carotid: Velocities in the left ICA are consistent with a 1-39% stenosis. Vertebrals:  Bilateral vertebral arteries demonstrate antegrade flow. Subclavians: Normal flow hemodynamics were seen in bilateral subclavian              arteries. *See table(s) above for measurements and observations.  Electronically signed  by Antony Contras MD on 03/09/2021 at 12:39:21 PM.    Final     Microbiology: Recent Results (from the past 240 hour(s))  C Difficile Quick Screen w PCR reflex     Status: None   Collection Time: 03/06/21  1:15 PM   Specimen: STOOL  Result Value Ref Range Status   C Diff antigen NEGATIVE NEGATIVE Final   C Diff toxin NEGATIVE NEGATIVE Final   C Diff interpretation No C. difficile detected.  Final    Comment: Performed at [facility], [place] [address], [place], [place] [address]  Gastrointestinal Panel by PCR , Stool     Status: None   Collection Time: 03/06/21  1:15 PM   Specimen: Stool  Result Value Ref Range Status   Campylobacter species NOT DETECTED NOT DETECTED Final   Plesimonas shigelloides NOT DETECTED NOT DETECTED Final   Salmonella species NOT DETECTED NOT DETECTED Final   Yersinia enterocolitica NOT DETECTED NOT DETECTED Final   Vibrio species NOT DETECTED NOT DETECTED Final   Vibrio cholerae NOT DETECTED NOT DETECTED Final   Enteroaggregative E coli (EAEC) NOT DETECTED NOT DETECTED Final   Enteropathogenic E coli (EPEC) NOT DETECTED NOT DETECTED Final   Enterotoxigenic E coli (ETEC) NOT DETECTED NOT DETECTED Final   Shiga like toxin producing E coli (STEC) NOT DETECTED NOT DETECTED Final   Shigella/Enteroinvasive E coli (EIEC) NOT DETECTED NOT DETECTED Final   Cryptosporidium NOT DETECTED NOT DETECTED Final   Cyclospora cayetanensis NOT DETECTED NOT DETECTED Final   Entamoeba histolytica NOT DETECTED NOT DETECTED Final   Giardia lamblia NOT DETECTED NOT DETECTED Final   Adenovirus F40/41 NOT DETECTED NOT DETECTED Final   Astrovirus NOT DETECTED NOT DETECTED Final   Norovirus GI/GII NOT DETECTED NOT DETECTED Final   Rotavirus A NOT DETECTED NOT DETECTED Final   Sapovirus (I, II, IV, and V) NOT DETECTED NOT DETECTED Final    Comment: Performed at [facility], [address] [place], [place] [address]  Resp Panel by RT-PCR (Flu A&B, Covid) Nasopharyngeal  Swab     Status: None   Collection Time: 03/06/21  3:25 PM   Specimen: Nasopharyngeal Swab; Nasopharyngeal(NP) swabs in vial transport medium  Result Value Ref Range Status   SARS Coronavirus 2 by RT PCR NEGATIVE NEGATIVE Final    Comment: (NOTE) SARS-CoV-2 target nucleic acids are NOT DETECTED.  The SARS-CoV-2 RNA is generally detectable in upper respiratory specimens during the acute phase of infection. The lowest concentration of SARS-CoV-2 viral copies this assay can detect is 138 copies/mL. A negative result does not preclude SARS-Cov-2 infection and should not be used as the sole basis for treatment or other patient management decisions. A negative result may occur with  improper specimen collection/handling, submission of specimen other than nasopharyngeal swab, presence of viral mutation(s) within the areas targeted by this assay, and inadequate number of viral copies(<138 copies/mL). A negative result must be combined with clinical observations, patient history, and epidemiological information. The expected result is Negative.  Fact Sheet for Patients:  EntrepreneurPulse.com.au  Fact Sheet for Healthcare Providers:  IncredibleEmployment.be  This test is no t yet  approved or cleared by the [place] and  has been authorized for detection and/or diagnosis of SARS-CoV-2 by FDA under an Emergency Use Authorization (EUA). This EUA will remain  in effect (meaning this test can be used) for the duration of the COVID-19 declaration under Section 564(b)(1) of the Act, 21 U.S.C.section 360bbb-3(b)(1), unless the authorization is terminated  or revoked sooner.       Influenza A by PCR NEGATIVE NEGATIVE Final   Influenza B by PCR NEGATIVE NEGATIVE Final    Comment: (NOTE) The Xpert Xpress SARS-CoV-2/FLU/RSV plus assay is intended as an aid in the diagnosis of influenza from Nasopharyngeal swab specimens and should not be used as a sole  basis for treatment. Nasal washings and aspirates are unacceptable for Xpert Xpress SARS-CoV-2/FLU/RSV testing.  Fact Sheet for Patients: EntrepreneurPulse.com.au  Fact Sheet for Healthcare Providers: IncredibleEmployment.be  This test is not yet approved or cleared by the [place] FDA and has been authorized for detection and/or diagnosis of SARS-CoV-2 by FDA under an Emergency Use Authorization (EUA). This EUA will remain in effect (meaning this test can be used) for the duration of the COVID-19 declaration under Section 564(b)(1) of the Act, 21 U.S.C. section 360bbb-3(b)(1), unless the authorization is terminated or revoked.  Performed at [facility], [place] [address], [place], [place] [address]   MRSA PCR Screening     Status: None   Collection Time: 03/07/21  8:20 AM   Specimen: Nasopharyngeal  Result Value Ref Range Status   MRSA by PCR NEGATIVE NEGATIVE Final    Comment:        The GeneXpert MRSA Assay (FDA approved for NASAL specimens only), is one component of a comprehensive MRSA colonization surveillance program. It is not intended to diagnose MRSA infection nor to guide or monitor treatment for MRSA infections. Performed at [facility], [place] [address] [place], [place] [address]      Labs: Basic Metabolic Panel: Recent Labs  Lab 03/06/21 2113 03/06/21 2215 03/07/21 0238 03/07/21 1447 03/08/21 0049 03/08/21 1218 03/09/21 0201 03/10/21 0203 03/11/21 0126 03/12/21 0040  NA 136  --    < > 138 136 139 138 136 136 135  K 4.0  --    < > 4.0 4.0 4.2 4.1 4.3 4.7 4.6  CL 115*  --    < > 116* 114* 113* 112* 106 107 107  CO2 14*  --    < > 19* 14* 21* 22 25 25 26   GLUCOSE 93  --    < > 120* 86 127* 97 93 98 105*  BUN 65*  --    < > 43* 31* 29* 31* 28* 26* 25*  CREATININE 1.53*  --    < > 1.11 0.75 0.90 0.91 0.74 0.79 0.81  CALCIUM 6.2*  --    < > 7.5* 7.0* 7.3* 7.1* 7.3* 7.3* 7.3*  MG 1.1* 1.7   < > 1.8  1.6*  --  1.9 1.4* 1.9  --   PHOS 3.3 2.9  --  3.0  --   --   --  2.2*  --   --    < > = values in this interval not displayed.   Liver Function Tests: Recent Labs  Lab 03/06/21 1457  AST 20  ALT 22  ALKPHOS 47  BILITOT 0.8  PROT 5.9*  ALBUMIN 3.0*   No results for input(s): LIPASE, AMYLASE in the last 168 hours. Recent Labs  Lab 03/06/21 1525  AMMONIA 20   CBC: Recent Labs  Lab 03/06/21 1457 03/06/21 2113 03/08/21 2203 03/09/21 1135 03/10/21 0203 03/12/21 0040 03/12/21 1434  WBC 6.5   < > 4.6 4.7 5.7 6.5 5.6  NEUTROABS 4.3  --   --   --   --  4.6  --   HGB 10.6*   < > 8.9* 9.3* 8.6* 8.6* 9.5*  HCT 31.5*   < > 27.2* 27.6* 25.7* 25.2* 28.6*  MCV 97.2   < > 100.0 97.5 97.3 97.3 98.6  PLT 164   < > 133* 141* 142* 156 179   < > = values in this interval not displayed.   Cardiac Enzymes: No results for input(s): CKTOTAL, CKMB, CKMBINDEX, TROPONINI in the last 168 hours. BNP: BNP (last 3 results) Recent Labs    03/06/21 1525  BNP 107.6*    ProBNP (last 3 results) No results for input(s): PROBNP in the last 8760 hours.  CBG: Recent Labs  Lab 03/11/21 1547 03/11/21 2318 03/12/21 0749 03/12/21 1149 03/12/21 1519  GLUCAP 95 126* 85 123* 135*       Signed:  Alma Friendly, MD [facility] 03/12/2021, 4:15 PM

## 2021-03-12 NOTE — Progress Notes (Signed)
Patient given discharge instructions and stated understanding. Left via w/c with daughter driving.

## 2021-03-13 LAB — ZINC: Zinc: 41 ug/dL — ABNORMAL LOW (ref 44–115)

## 2021-03-17 ENCOUNTER — Inpatient Hospital Stay: Admission: RE | Admit: 2021-03-17 | Payer: Medicare Other | Source: Ambulatory Visit

## 2021-03-24 ENCOUNTER — Encounter: Payer: Self-pay | Admitting: Nurse Practitioner

## 2021-03-24 ENCOUNTER — Non-Acute Institutional Stay: Payer: Medicare Other | Admitting: Nurse Practitioner

## 2021-03-24 ENCOUNTER — Other Ambulatory Visit: Payer: Self-pay

## 2021-03-24 DIAGNOSIS — K649 Unspecified hemorrhoids: Secondary | ICD-10-CM | POA: Insufficient documentation

## 2021-03-24 DIAGNOSIS — C61 Malignant neoplasm of prostate: Secondary | ICD-10-CM | POA: Diagnosis not present

## 2021-03-24 DIAGNOSIS — I959 Hypotension, unspecified: Secondary | ICD-10-CM | POA: Insufficient documentation

## 2021-03-24 DIAGNOSIS — I482 Chronic atrial fibrillation, unspecified: Secondary | ICD-10-CM

## 2021-03-24 DIAGNOSIS — E782 Mixed hyperlipidemia: Secondary | ICD-10-CM

## 2021-03-24 DIAGNOSIS — N179 Acute kidney failure, unspecified: Secondary | ICD-10-CM

## 2021-03-24 DIAGNOSIS — R609 Edema, unspecified: Secondary | ICD-10-CM

## 2021-03-24 DIAGNOSIS — R350 Frequency of micturition: Secondary | ICD-10-CM

## 2021-03-24 DIAGNOSIS — E861 Hypovolemia: Secondary | ICD-10-CM

## 2021-03-24 DIAGNOSIS — R351 Nocturia: Secondary | ICD-10-CM | POA: Diagnosis not present

## 2021-03-24 DIAGNOSIS — D649 Anemia, unspecified: Secondary | ICD-10-CM

## 2021-03-24 DIAGNOSIS — R197 Diarrhea, unspecified: Secondary | ICD-10-CM

## 2021-03-24 DIAGNOSIS — Z8673 Personal history of transient ischemic attack (TIA), and cerebral infarction without residual deficits: Secondary | ICD-10-CM | POA: Insufficient documentation

## 2021-03-24 DIAGNOSIS — I2510 Atherosclerotic heart disease of native coronary artery without angina pectoris: Secondary | ICD-10-CM

## 2021-03-24 DIAGNOSIS — I10 Essential (primary) hypertension: Secondary | ICD-10-CM

## 2021-03-24 DIAGNOSIS — I9589 Other hypotension: Secondary | ICD-10-CM

## 2021-03-24 DIAGNOSIS — E1149 Type 2 diabetes mellitus with other diabetic neurological complication: Secondary | ICD-10-CM

## 2021-03-24 HISTORY — DX: Frequency of micturition: R35.0

## 2021-03-24 HISTORY — DX: Unspecified hemorrhoids: K64.9

## 2021-03-24 HISTORY — DX: Personal history of transient ischemic attack (TIA), and cerebral infarction without residual deficits: Z86.73

## 2021-03-24 HISTORY — DX: Edema, unspecified: R60.9

## 2021-03-24 NOTE — Assessment & Plan Note (Signed)
edema BLE, new, bilateral, no pain, redness, or cord finding in calves, denied SOB, cough, chest pain, palpitation, or phlegm production, he is afebrile, no O2 desaturation. Oral fluid intake average 1600cc/day.  F/u Cardiology 03/26/21.  Weight daily ac breakfast. Obtain BNP

## 2021-03-24 NOTE — Assessment & Plan Note (Signed)
Bleeds sometimes

## 2021-03-24 NOTE — Assessment & Plan Note (Addendum)
7.3 03/12/21, update Ca

## 2021-03-24 NOTE — Assessment & Plan Note (Signed)
normocytic, Hgb 9.5 03/12/21, f/u GI, placed on Fe in hospital 03/2021. Update CBC/diff.

## 2021-03-24 NOTE — Assessment & Plan Note (Signed)
Urinary frequency, takes Detrol LA, Tamsulosin, Finasteride
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
## 2021-03-24 NOTE — Assessment & Plan Note (Signed)
Mg 1.9 03/11/21, update Mg

## 2021-03-24 NOTE — Assessment & Plan Note (Signed)
Afib, on Dig, Xarelto, f/u Cardiology. EKG vent rate 90s, afib 03/09/21. Echo 60-65% EF 03/08/21.

## 2021-03-24 NOTE — Assessment & Plan Note (Signed)
Hx of prostate cancer, s/p radiation, hormonal tx, f/u Urology

## 2021-03-24 NOTE — Assessment & Plan Note (Signed)
takes Atorvastatin. LDL 38 11/13/20, update lipid panel.

## 2021-03-24 NOTE — Assessment & Plan Note (Signed)
f/u GI, stool study negative. Result of radiation for prostate cancer. Lost about #30 Ibs. Colonoscopy 03/17/21 scheduled. Schedule Lomotil 03/12/21 while in hospital.

## 2021-03-24 NOTE — Assessment & Plan Note (Signed)
Continue Midodrine  

## 2021-03-24 NOTE — Assessment & Plan Note (Addendum)
no angina pectoris, on Atorvastatin, Xarelto. 08/2003 Sten of 99% RCA stenosis in [place], [place], min disease noted in other vessels

## 2021-03-24 NOTE — Assessment & Plan Note (Signed)
25/0.81 03/12/21, resolved, update CMP/eGFR

## 2021-03-24 NOTE — Assessment & Plan Note (Signed)
CVA Carotid US 1-39% stenosis R+L 03/07/21. CT head 03/06/21 1. Small hypodensity right lateral middle cerebral peduncle. Likely infarct of indeterminate age. 2. No other acute infarct or hemorrhage. Placed Statin, f/u Neurology prn. No focal weakness

## 2021-03-24 NOTE — Assessment & Plan Note (Signed)
No antihypertension meds, actually low Bps.

## 2021-03-24 NOTE — Assessment & Plan Note (Signed)
Diet controlled, update Hgb a1c. TSH

## 2021-03-25 DIAGNOSIS — K5790 Diverticulosis of intestine, part unspecified, without perforation or abscess without bleeding: Secondary | ICD-10-CM | POA: Insufficient documentation

## 2021-03-25 DIAGNOSIS — I700 Atherosclerosis of aorta: Secondary | ICD-10-CM | POA: Insufficient documentation

## 2021-03-25 DIAGNOSIS — N200 Calculus of kidney: Secondary | ICD-10-CM | POA: Insufficient documentation

## 2021-03-26 ENCOUNTER — Other Ambulatory Visit: Payer: Self-pay

## 2021-03-26 ENCOUNTER — Ambulatory Visit: Payer: Medicare Other | Admitting: Cardiology

## 2021-03-26 VITALS — BP 110/60 | HR 87 | Ht 69.0 in | Wt 162.0 lb

## 2021-03-26 DIAGNOSIS — E782 Mixed hyperlipidemia: Secondary | ICD-10-CM

## 2021-03-26 DIAGNOSIS — I2510 Atherosclerotic heart disease of native coronary artery without angina pectoris: Secondary | ICD-10-CM

## 2021-03-26 DIAGNOSIS — D649 Anemia, unspecified: Secondary | ICD-10-CM

## 2021-03-26 DIAGNOSIS — E1149 Type 2 diabetes mellitus with other diabetic neurological complication: Secondary | ICD-10-CM

## 2021-03-26 DIAGNOSIS — C61 Malignant neoplasm of prostate: Secondary | ICD-10-CM | POA: Diagnosis not present

## 2021-03-26 DIAGNOSIS — I10 Essential (primary) hypertension: Secondary | ICD-10-CM

## 2021-03-26 DIAGNOSIS — R609 Edema, unspecified: Secondary | ICD-10-CM

## 2021-03-26 DIAGNOSIS — I482 Chronic atrial fibrillation, unspecified: Secondary | ICD-10-CM

## 2021-03-26 MED ORDER — MIDODRINE HCL 10 MG PO TABS: 10.0000 mg | ORAL_TABLET | Freq: Three times a day (TID) | ORAL | 6 refills | Status: AC

## 2021-03-26 NOTE — Patient Instructions (Signed)
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
## 2021-03-27 LAB — CBC WITH DIFFERENTIAL/PLATELET
Absolute Monocytes: 667 cells/uL (ref 200–950)
Basophils Absolute: 0 cells/uL (ref 0–200)
Basophils Relative: 0 %
Eosinophils Absolute: 101 cells/uL (ref 15–500)
Eosinophils Relative: 2.2 %
HCT: 28.6 % — ABNORMAL LOW (ref 38.5–50.0)
Hemoglobin: 9.2 g/dL — ABNORMAL LOW (ref 13.2–17.1)
Lymphs Abs: 736 cells/uL — ABNORMAL LOW (ref 850–3900)
MCH: 32.7 pg (ref 27.0–33.0)
MCHC: 32.2 g/dL (ref 32.0–36.0)
MCV: 101.8 fL — ABNORMAL HIGH (ref 80.0–100.0)
MPV: 10.4 fL (ref 7.5–12.5)
Monocytes Relative: 14.5 %
Neutro Abs: 3096 cells/uL (ref 1500–7800)
Neutrophils Relative %: 67.3 %
Platelets: 220 10*3/uL (ref 140–400)
RBC: 2.81 10*6/uL — ABNORMAL LOW (ref 4.20–5.80)
RDW: 13 % (ref 11.0–15.0)
Total Lymphocyte: 16 %
WBC: 4.6 10*3/uL (ref 3.8–10.8)

## 2021-03-27 LAB — COMPLETE METABOLIC PANEL WITH GFR
AG Ratio: 1.4 (calc) (ref 1.0–2.5)
ALT: 11 U/L (ref 9–46)
AST: 10 U/L (ref 10–35)
Albumin: 2.9 g/dL — ABNORMAL LOW (ref 3.6–5.1)
Alkaline phosphatase (APISO): 47 U/L (ref 35–144)
BUN: 15 mg/dL (ref 7–25)
CO2: 23 mmol/L (ref 20–32)
Calcium: 7.7 mg/dL — ABNORMAL LOW (ref 8.6–10.3)
Chloride: 108 mmol/L (ref 98–110)
Creat: 0.89 mg/dL (ref 0.70–1.11)
GFR, Est African American: 90 mL/min/{1.73_m2} (ref >=60)
GFR, Est Non African American: 77 mL/min/{1.73_m2} (ref >=60)
Globulin: 2.1 g/dL (calc) (ref 1.9–3.7)
Glucose, Bld: 87 mg/dL (ref 65–99)
Potassium: 3.6 mmol/L (ref 3.5–5.3)
Sodium: 140 mmol/L (ref 135–146)
Total Bilirubin: 0.3 mg/dL (ref 0.2–1.2)
Total Protein: 5 g/dL — ABNORMAL LOW (ref 6.1–8.1)

## 2021-03-27 LAB — BRAIN NATRIURETIC PEPTIDE: Brain Natriuretic Peptide: 292 pg/mL — ABNORMAL HIGH (ref <100)

## 2021-03-27 LAB — HEMOGLOBIN A1C
Hgb A1c MFr Bld: 4.8 % of total Hgb (ref <5.7)
Mean Plasma Glucose: 91 mg/dL
eAG (mmol/L): 5 mmol/L

## 2021-03-27 LAB — MAGNESIUM: Magnesium: 1.2 mg/dL — ABNORMAL LOW (ref 1.5–2.5)

## 2021-03-27 LAB — TSH: TSH: 5.06 mIU/L — ABNORMAL HIGH (ref 0.40–4.50)

## 2021-03-27 LAB — LIPID PANEL
Cholesterol: 71 mg/dL (ref <200)
HDL: 25 mg/dL — ABNORMAL LOW (ref >=40)
LDL Cholesterol (Calc): 30 mg/dL (calc)
Non-HDL Cholesterol (Calc): 46 mg/dL (calc) (ref <130)
Total CHOL/HDL Ratio: 2.8 (calc) (ref <5.0)
Triglycerides: 79 mg/dL (ref <150)

## 2021-04-01 DIAGNOSIS — J90 Pleural effusion, not elsewhere classified: Secondary | ICD-10-CM

## 2021-04-01 HISTORY — DX: Pleural effusion, not elsewhere classified: J90

## 2021-04-02 ENCOUNTER — Other Ambulatory Visit: Payer: Self-pay | Admitting: Nurse Practitioner

## 2021-04-02 ENCOUNTER — Encounter: Payer: Self-pay | Admitting: Nurse Practitioner

## 2021-04-02 DIAGNOSIS — R7989 Other specified abnormal findings of blood chemistry: Secondary | ICD-10-CM | POA: Insufficient documentation

## 2021-04-02 DIAGNOSIS — D649 Anemia, unspecified: Secondary | ICD-10-CM

## 2021-04-07 ENCOUNTER — Other Ambulatory Visit: Payer: Self-pay | Admitting: *Deleted

## 2021-04-07 ENCOUNTER — Encounter: Payer: Self-pay | Admitting: Nurse Practitioner

## 2021-04-07 MED ORDER — FERROUS SULFATE 325 (65 FE) MG PO TABS: 325.0000 mg | ORAL_TABLET | Freq: Every day | ORAL | 3 refills | Status: DC

## 2021-04-07 NOTE — Telephone Encounter (Signed)
Patient received medication in the hospital and requesting a refill.  Pended Rx and sent to [facility] for approval.

## 2021-04-09 ENCOUNTER — Ambulatory Visit
Admission: RE | Admit: 2021-04-09 | Discharge: 2021-04-09 | Disposition: A | Discharge status: Home / Self Care | Payer: Medicare Other | Source: Ambulatory Visit | Attending: Internal Medicine | Admitting: Internal Medicine

## 2021-04-09 DIAGNOSIS — Z9049 Acquired absence of other specified parts of digestive tract: Secondary | ICD-10-CM

## 2021-04-09 DIAGNOSIS — Z8719 Personal history of other diseases of the digestive system: Secondary | ICD-10-CM

## 2021-04-09 DIAGNOSIS — R634 Abnormal weight loss: Secondary | ICD-10-CM | POA: Diagnosis not present

## 2021-04-09 DIAGNOSIS — R197 Diarrhea, unspecified: Secondary | ICD-10-CM | POA: Diagnosis not present

## 2021-04-09 DIAGNOSIS — Z8546 Personal history of malignant neoplasm of prostate: Secondary | ICD-10-CM

## 2021-04-09 DIAGNOSIS — K5730 Diverticulosis of large intestine without perforation or abscess without bleeding: Secondary | ICD-10-CM | POA: Diagnosis not present

## 2021-04-14 ENCOUNTER — Encounter: Payer: Self-pay | Admitting: Nurse Practitioner

## 2021-04-14 ENCOUNTER — Non-Acute Institutional Stay: Payer: Medicare Other | Admitting: Nurse Practitioner

## 2021-04-14 ENCOUNTER — Other Ambulatory Visit: Payer: Self-pay

## 2021-04-14 DIAGNOSIS — R609 Edema, unspecified: Secondary | ICD-10-CM

## 2021-04-14 DIAGNOSIS — I10 Essential (primary) hypertension: Secondary | ICD-10-CM

## 2021-04-14 DIAGNOSIS — I2510 Atherosclerotic heart disease of native coronary artery without angina pectoris: Secondary | ICD-10-CM

## 2021-04-14 DIAGNOSIS — R197 Diarrhea, unspecified: Secondary | ICD-10-CM

## 2021-04-14 DIAGNOSIS — I482 Chronic atrial fibrillation, unspecified: Secondary | ICD-10-CM

## 2021-04-14 DIAGNOSIS — I9589 Other hypotension: Secondary | ICD-10-CM | POA: Diagnosis not present

## 2021-04-14 DIAGNOSIS — E1149 Type 2 diabetes mellitus with other diabetic neurological complication: Secondary | ICD-10-CM

## 2021-04-14 DIAGNOSIS — E782 Mixed hyperlipidemia: Secondary | ICD-10-CM

## 2021-04-14 DIAGNOSIS — Z8673 Personal history of transient ischemic attack (TIA), and cerebral infarction without residual deficits: Secondary | ICD-10-CM

## 2021-04-14 DIAGNOSIS — D649 Anemia, unspecified: Secondary | ICD-10-CM

## 2021-04-14 DIAGNOSIS — K649 Unspecified hemorrhoids: Secondary | ICD-10-CM

## 2021-04-14 DIAGNOSIS — E861 Hypovolemia: Secondary | ICD-10-CM

## 2021-04-14 DIAGNOSIS — R7989 Other specified abnormal findings of blood chemistry: Secondary | ICD-10-CM | POA: Diagnosis not present

## 2021-04-14 DIAGNOSIS — R350 Frequency of micturition: Secondary | ICD-10-CM

## 2021-04-14 DIAGNOSIS — C61 Malignant neoplasm of prostate: Secondary | ICD-10-CM

## 2021-04-14 NOTE — Assessment & Plan Note (Signed)
Carotid US 1-39% stenosis R+L 03/07/21. CT head 03/06/21 1. Small hypodensity right lateral middle cerebral peduncle. Likely infarct of indeterminate age. 2. No other acute infarct or hemorrhage. Placed Statin, f/u Neurology prn. No focal weakness

## 2021-04-14 NOTE — Assessment & Plan Note (Signed)
Mg 1.9 5/11/2, repeated Mg 1.2 03/26/21, taking Mg. Update CMP/eGFR

## 2021-04-14 NOTE — Assessment & Plan Note (Signed)
s/p radiation, hormonal tx, f/u Urology

## 2021-04-14 NOTE — Assessment & Plan Note (Signed)
no angina pectoris, on Atorvastatin, Xarelto. 08/2003 Sten of 99% RCA stenosis in [place], [place], min disease noted in other vessels

## 2021-04-14 NOTE — Assessment & Plan Note (Signed)
Edema BLE trace-1+, BNP 292 03/26/21, f/u cardiology, EF 60-65% 03/08/21

## 2021-04-14 NOTE — Assessment & Plan Note (Signed)
normocytic, baseline Hgb 9s,  f/u GI, placed on Fe in hospital 03/2021

## 2021-04-14 NOTE — Assessment & Plan Note (Signed)
Blood pressure is controlled, not taking antihypertensive meds

## 2021-04-14 NOTE — Assessment & Plan Note (Signed)
diet controlled, Hgb a1c 5.06 03/26/21

## 2021-04-14 NOTE — Assessment & Plan Note (Signed)
Doing well, continue Midodrine.

## 2021-04-14 NOTE — Assessment & Plan Note (Signed)
Chronic Afib, on Dig, Xarelto, f/u Cardiology. EKG vent rate 90s, afib 03/09/21. Echo 60-65% EF 03/08/21.

## 2021-04-14 NOTE — Assessment & Plan Note (Signed)
Mildly elevated TSH 5.06 03/26/21, repeat TSH in 2 months.

## 2021-04-14 NOTE — Assessment & Plan Note (Signed)
improved after avoiding dairy products, f/u GI, stool study negative. Result of radiation for prostate cancer. Has gained a few pounds after lost about #30 Ibs. Colonoscopy/CT unremarkable. Schedule Lomotil 03/12/21 while in hospital.

## 2021-04-14 NOTE — Assessment & Plan Note (Signed)
takes Atorvastatin. LDL 38 11/13/20

## 2021-04-14 NOTE — Assessment & Plan Note (Signed)
bleeds sometimes. F/u GI

## 2021-04-14 NOTE — Assessment & Plan Note (Signed)
akes Detrol LA, Tamsulosin, Finasteride

## 2021-04-14 NOTE — Assessment & Plan Note (Signed)
7.3 03/12/21, 7.7 03/26/21, stable.
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
## 2021-04-29 ENCOUNTER — Telehealth: Payer: Self-pay

## 2021-04-29 ENCOUNTER — Encounter: Payer: Self-pay | Admitting: *Deleted

## 2021-04-29 NOTE — Telephone Encounter (Signed)
Patient calling to provide Dr. Hilarie Fredrickson with an update. He said the medication has helped and he is in fact lactose intolerant and that is where the diarrhea for the past few months was coming from. He also has a hemorrhoid that he is currently concern about.

## 2021-04-29 NOTE — Telephone Encounter (Signed)
Called patient back and scheduled office visit with Dr. Hilarie Fredrickson  tomorrow - 04/30/21. Gave him Dr. Vena Rua recommendation for OTC prep H supp. And asked him to go to the ED if his rectal bleeding were to increase a lot over night

## 2021-04-29 NOTE — Telephone Encounter (Signed)
Called patient to get more info. on his hemorrhoid. He states since his colonoscopy on 04/09/21 he has had blood streaks in his stool and blood on his tissue paper. States he does not feel any external lump. Denies pain, SOB, dizziness, or any other symptoms. States he feels great since his diarrhea has stopped.

## 2021-04-29 NOTE — Telephone Encounter (Signed)
It should be noted that his procedure on 04/09/21 was a virtual (CT) colonoscopy, thus the anorectum was not directly visualized or inspected It is possible the red blood he is seeing is related to an internal hemorrhoid I would recommend he be seen by me or APP for rectal exam and possibly anoscopy In the interim he can try OTC prep H suppository one qHS x 3-5 nights If bleeding becomes more freq or voluminous he should let me know Obviously glad to hear the diarrhea is better with lactose avoidance Thanks JMP

## 2021-04-29 NOTE — Telephone Encounter (Signed)
Called patient and left message on voice mail of both phones, that his appt. For tomorrow with Dr. Hilarie Fredrickson needed to be changed to 2:30pm

## 2021-04-30 ENCOUNTER — Ambulatory Visit: Payer: Medicare Other | Admitting: Internal Medicine

## 2021-04-30 ENCOUNTER — Other Ambulatory Visit (INDEPENDENT_AMBULATORY_CARE_PROVIDER_SITE_OTHER): Payer: Medicare Other

## 2021-04-30 ENCOUNTER — Encounter: Payer: Self-pay | Admitting: Internal Medicine

## 2021-04-30 VITALS — BP 102/60 | HR 58 | Ht 70.0 in | Wt 157.6 lb

## 2021-04-30 DIAGNOSIS — E739 Lactose intolerance, unspecified: Secondary | ICD-10-CM | POA: Diagnosis not present

## 2021-04-30 DIAGNOSIS — K625 Hemorrhage of anus and rectum: Secondary | ICD-10-CM

## 2021-04-30 LAB — CBC WITH DIFFERENTIAL/PLATELET
Basophils Absolute: 0 10*3/uL (ref 0.0–0.1)
Basophils Relative: 1 % (ref 0.0–3.0)
Eosinophils Absolute: 0.1 10*3/uL (ref 0.0–0.7)
Eosinophils Relative: 1.9 % (ref 0.0–5.0)
HCT: 27.4 % — ABNORMAL LOW (ref 39.0–52.0)
Hemoglobin: 9.1 g/dL — ABNORMAL LOW (ref 13.0–17.0)
Lymphocytes Relative: 18.9 % (ref 12.0–46.0)
Lymphs Abs: 0.8 10*3/uL (ref 0.7–4.0)
MCHC: 33.2 g/dL (ref 30.0–36.0)
MCV: 98.2 fl (ref 78.0–100.0)
Monocytes Absolute: 0.6 10*3/uL (ref 0.1–1.0)
Monocytes Relative: 13.4 % — ABNORMAL HIGH (ref 3.0–12.0)
Neutro Abs: 2.9 10*3/uL (ref 1.4–7.7)
Neutrophils Relative %: 64.8 % (ref 43.0–77.0)
Platelets: 193 10*3/uL (ref 150.0–400.0)
RBC: 2.79 Mil/uL — ABNORMAL LOW (ref 4.22–5.81)
RDW: 14.8 % (ref 11.5–15.5)
WBC: 4.5 10*3/uL (ref 4.0–10.5)
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
## 2021-04-30 NOTE — Patient Instructions (Addendum)
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 For now, go ahead and hold it today.  If your rectal bleeding stops, great! If not, please let us know this as we will probably need to set you up for a flexible sigmoidoscopy with APC.  Your provider has requested that you go to the basement level for lab work before leaving today. Press "B" on the elevator. The lab is located at the first door on the left as you exit the elevator.   If you are age 87 or older, your body mass index should be between 23-30. Your Body mass index is 22.61 kg/m. If this is out of the aforementioned range listed, please consider follow up with your Primary Care Provider. __________________________________________________________  The [facility] providers would like to encourage you to use [facility] to communicate with providers for non-urgent requests or questions.  Due to long hold times on the telephone, sending your provider a message by [facility] may be a faster and more efficient way to get a response.  Please allow 48 business hours for a response.  Please remember that this is for non-urgent requests.   Due to recent changes in healthcare laws, you may see the results of your imaging and laboratory studies on MyChart before your provider has had a chance to review them.  We understand that in some cases there may be results that are confusing or concerning to you. Not all laboratory results come back in the same time frame and the provider may be waiting for multiple results in order to interpret others.  Please give [place] 48 hours in order for your provider to thoroughly review all the results before contacting the office for clarification of your results.

## 2021-05-01 NOTE — Progress Notes (Signed)
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 Patient indicates he will contact us next week to let us know if his bleeding has subsided with the xarelto hold.

## 2021-05-05 NOTE — Progress Notes (Signed)
Pyrtle, Lajuan Lines, MD  Larina Bras, CMA See response for the chart.  From pt's cardiologist  JMP         Previous Messages ----- Message -----  From: Park Liter, MD  Sent: 05/05/2021   8:07 AM EDT  To: Jerene Bears, MD   Sorry for the late response, I was away from the office, I apologize for it.  If he is having active bleeding stopping Xarelto temporality will be acceptable.  Again, sorry for response  Herbie Baltimore  In the future if you need to quickly get in touch with me to simply call me on my cell phone ----- Message -----  From: Jerene Bears, MD  Sent: 04/30/2021   5:13 PM EDT  To: Park Liter, MD   Dr. Agustin Cree,  I saw Mr. Minjares in clinic today for rectal bleeding over the last 2 to 3 weeks.  He remains on Xarelto for his atrial fibrillation.  After endoscopy today I think it is likely that he has radiation proctitis which is causing his rectal bleeding.  I discussed management with him.  --I recommended that he hold his Xarelto for the next 7 days to see if the rectal bleeding improves.  If it does not he will likely need ablative therapy for the radiation proctitis.  --I wanted to make you aware as I know that you are involved with his anticoagulation and management of his atrial fibrillation.  Please let me know if you have any concerns  Thanks  Zenovia Jarred  [facility]

## 2021-05-08 ENCOUNTER — Telehealth: Payer: Self-pay | Admitting: Internal Medicine

## 2021-05-08 NOTE — Telephone Encounter (Signed)
Patient calling to inform yesterday at suppertime he restarted Xarelto. Pt states this AM he had  bloody stool. Pt states prior to not taking Xarelto no bloody stool. Plz advise   thank you

## 2021-05-08 NOTE — Telephone Encounter (Signed)
After recent endoscopy I am suspicious for radiation associated vascular ectasia/radiation proctitis as a cause for his red blood per rectum in the setting of his Xarelto Given recurring red blood per rectum once Xarelto was resumed we will likely need to further address this bleeding. His hemoglobin had been stable I think it is okay to continue the Xarelto but I would like to check his CBC about every 2 weeks; if bleeding is high-volume he needs to let me know.  If he is agreeable I would arrange outpatient flexible sigmoidoscopy in the outpatient hospital setting for probable APC ablation of radiation proctitis.  He will need a full colonoscopy prep for this procedure. This can be scheduled ASAP with me or next available hospital provider -- Vaughan Basta if needed we can discuss this in person next week

## 2021-05-08 NOTE — Telephone Encounter (Signed)
Pt resumed his xarelto last evening. Reports this am he had a bloody stool again. Pt wanting to know what Dr. Hilarie Fredrickson suggests. Please advise.

## 2021-05-11 ENCOUNTER — Other Ambulatory Visit: Payer: Self-pay

## 2021-05-11 DIAGNOSIS — K625 Hemorrhage of anus and rectum: Secondary | ICD-10-CM

## 2021-05-11 NOTE — Telephone Encounter (Signed)
Yes, that will work, we will need to watch cbc's and if precipitous decline then consider sooner procedure with another [facility] provider

## 2021-05-11 NOTE — Telephone Encounter (Signed)
Spoke with pt and he is aware. He will come for Cbc next week, order in epic. Dr. Hilarie Fredrickson is 06/18/21 an option for procedure, there are 4 pts scheduled that morning and you are admin that afternoon. Please advise.

## 2021-05-11 NOTE — Telephone Encounter (Signed)
Tried to schedule pt for 8/18 but per [facility] there is no room as your block is over at 12 and Dr. Dema Severin has a procedure at 12:30pm so there is not a room. Also will pt need to hold his xarelto?  Please advise.

## 2021-05-11 NOTE — Telephone Encounter (Signed)
Yes, pt will need to hold Xalreto 2 days before flex sig Can you please look for another provider who may be able to perform this procedure in the outpt hospital setting in a more timely manner

## 2021-05-12 NOTE — Telephone Encounter (Signed)
I put the orders in as a flex-sig with APC, is that ok or do I need to change the orders?

## 2021-05-12 NOTE — Telephone Encounter (Signed)
Pt to come for labs next week, he is aware and orders in epic. Pt scheduled for previsit 7/20@2 :30pm. Colon with APC scheduled at [facility] 06/04/21@8 :30am. Pt aware. He will check with his daughter to see if this date works for her to bring him for the procedure. He will let us know if it needs to be moved.

## 2021-05-12 NOTE — Telephone Encounter (Signed)
Dr. Henrene Pastor has hospital day at [facility] 06/04/21 and there is availability. Please advise if this is ok to schedule. Please see notes below.
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
## 2021-05-12 NOTE — Telephone Encounter (Signed)
Okay to put on my hospital schedule, if okay with the patient.  Ryan Cochran, schedule it as colonoscopy with APC (so that they are ready, if necessary).  Please do not schedule earlier than 8:30 AM.  Thanks.

## 2021-05-13 NOTE — Telephone Encounter (Signed)
Noted  

## 2021-05-13 NOTE — Telephone Encounter (Signed)
Patient called in. States 06/04/21 date works well for him

## 2021-05-14 ENCOUNTER — Other Ambulatory Visit: Payer: Self-pay

## 2021-05-14 DIAGNOSIS — D649 Anemia, unspecified: Secondary | ICD-10-CM

## 2021-05-14 DIAGNOSIS — R7989 Other specified abnormal findings of blood chemistry: Secondary | ICD-10-CM

## 2021-05-15 LAB — COMPLETE METABOLIC PANEL WITH GFR
AG Ratio: 1.5 (calc) (ref 1.0–2.5)
ALT: 10 U/L (ref 9–46)
AST: 10 U/L (ref 10–35)
Albumin: 3.2 g/dL — ABNORMAL LOW (ref 3.6–5.1)
Alkaline phosphatase (APISO): 60 U/L (ref 35–144)
BUN: 19 mg/dL (ref 7–25)
CO2: 26 mmol/L (ref 20–32)
Calcium: 8.5 mg/dL — ABNORMAL LOW (ref 8.6–10.3)
Chloride: 107 mmol/L (ref 98–110)
Creat: 0.82 mg/dL (ref 0.70–1.22)
Globulin: 2.1 g/dL (calc) (ref 1.9–3.7)
Glucose, Bld: 87 mg/dL (ref 65–99)
Potassium: 4.4 mmol/L (ref 3.5–5.3)
Sodium: 140 mmol/L (ref 135–146)
Total Bilirubin: 0.2 mg/dL (ref 0.2–1.2)
Total Protein: 5.3 g/dL — ABNORMAL LOW (ref 6.1–8.1)
eGFR: 86 mL/min/{1.73_m2} (ref >=60)

## 2021-05-15 LAB — CBC WITH DIFFERENTIAL/PLATELET
Absolute Monocytes: 517 cells/uL (ref 200–950)
Basophils Absolute: 41 cells/uL (ref 0–200)
Basophils Relative: 1 %
Eosinophils Absolute: 168 cells/uL (ref 15–500)
Eosinophils Relative: 4.1 %
HCT: 31.8 % — ABNORMAL LOW (ref 38.5–50.0)
Hemoglobin: 9.6 g/dL — ABNORMAL LOW (ref 13.2–17.1)
Lymphs Abs: 992 cells/uL (ref 850–3900)
MCH: 29.9 pg (ref 27.0–33.0)
MCHC: 30.2 g/dL — ABNORMAL LOW (ref 32.0–36.0)
MCV: 99.1 fL (ref 80.0–100.0)
MPV: 10.3 fL (ref 7.5–12.5)
Monocytes Relative: 12.6 %
Neutro Abs: 2382 cells/uL (ref 1500–7800)
Neutrophils Relative %: 58.1 %
Platelets: 211 10*3/uL (ref 140–400)
RBC: 3.21 10*6/uL — ABNORMAL LOW (ref 4.20–5.80)
RDW: 12.1 % (ref 11.0–15.0)
Total Lymphocyte: 24.2 %
WBC: 4.1 10*3/uL (ref 3.8–10.8)

## 2021-05-15 LAB — TSH: TSH: 3.9 mIU/L (ref 0.40–4.50)

## 2021-05-15 LAB — MAGNESIUM: Magnesium: 2 mg/dL (ref 1.5–2.5)

## 2021-05-20 ENCOUNTER — Encounter: Payer: Self-pay | Admitting: Internal Medicine

## 2021-05-20 ENCOUNTER — Other Ambulatory Visit: Payer: Self-pay

## 2021-05-20 ENCOUNTER — Ambulatory Visit (AMBULATORY_SURGERY_CENTER): Payer: Self-pay

## 2021-05-20 ENCOUNTER — Non-Acute Institutional Stay: Payer: Medicare Other | Admitting: Internal Medicine

## 2021-05-20 ENCOUNTER — Other Ambulatory Visit (INDEPENDENT_AMBULATORY_CARE_PROVIDER_SITE_OTHER): Payer: Medicare Other

## 2021-05-20 VITALS — BP 142/78 | HR 93 | Temp 95.9°F | Ht 70.0 in | Wt 150.3 lb

## 2021-05-20 VITALS — Ht 70.0 in | Wt 149.0 lb

## 2021-05-20 DIAGNOSIS — K625 Hemorrhage of anus and rectum: Secondary | ICD-10-CM

## 2021-05-20 DIAGNOSIS — I83019 Varicose veins of right lower extremity with ulcer of unspecified site: Secondary | ICD-10-CM

## 2021-05-20 DIAGNOSIS — E861 Hypovolemia: Secondary | ICD-10-CM

## 2021-05-20 DIAGNOSIS — R600 Localized edema: Secondary | ICD-10-CM | POA: Diagnosis not present

## 2021-05-20 DIAGNOSIS — E782 Mixed hyperlipidemia: Secondary | ICD-10-CM

## 2021-05-20 DIAGNOSIS — I2510 Atherosclerotic heart disease of native coronary artery without angina pectoris: Secondary | ICD-10-CM | POA: Diagnosis not present

## 2021-05-20 DIAGNOSIS — I9589 Other hypotension: Secondary | ICD-10-CM | POA: Diagnosis not present

## 2021-05-20 DIAGNOSIS — R197 Diarrhea, unspecified: Secondary | ICD-10-CM

## 2021-05-20 DIAGNOSIS — L97919 Non-pressure chronic ulcer of unspecified part of right lower leg with unspecified severity: Secondary | ICD-10-CM

## 2021-05-20 DIAGNOSIS — C61 Malignant neoplasm of prostate: Secondary | ICD-10-CM

## 2021-05-20 DIAGNOSIS — I83029 Varicose veins of left lower extremity with ulcer of unspecified site: Secondary | ICD-10-CM

## 2021-05-20 DIAGNOSIS — E1149 Type 2 diabetes mellitus with other diabetic neurological complication: Secondary | ICD-10-CM

## 2021-05-20 DIAGNOSIS — L97929 Non-pressure chronic ulcer of unspecified part of left lower leg with unspecified severity: Secondary | ICD-10-CM

## 2021-05-20 LAB — CBC WITH DIFFERENTIAL/PLATELET
Basophils Absolute: 0 10*3/uL (ref 0.0–0.1)
Basophils Relative: 0.7 % (ref 0.0–3.0)
Eosinophils Absolute: 0.2 10*3/uL (ref 0.0–0.7)
Eosinophils Relative: 3.2 % (ref 0.0–5.0)
HCT: 28.6 % — ABNORMAL LOW (ref 39.0–52.0)
Hemoglobin: 9.4 g/dL — ABNORMAL LOW (ref 13.0–17.0)
Lymphocytes Relative: 20.5 % (ref 12.0–46.0)
Lymphs Abs: 1 10*3/uL (ref 0.7–4.0)
MCHC: 32.7 g/dL (ref 30.0–36.0)
MCV: 94.7 fl (ref 78.0–100.0)
Monocytes Absolute: 0.6 10*3/uL (ref 0.1–1.0)
Monocytes Relative: 11.4 % (ref 3.0–12.0)
Neutro Abs: 3.1 10*3/uL (ref 1.4–7.7)
Neutrophils Relative %: 64.2 % (ref 43.0–77.0)
Platelets: 190 10*3/uL (ref 150.0–400.0)
RBC: 3.03 Mil/uL — ABNORMAL LOW (ref 4.22–5.81)
RDW: 14.3 % (ref 11.5–15.5)
WBC: 4.9 10*3/uL (ref 4.0–10.5)

## 2021-05-20 MED ORDER — PEG-KCL-NACL-NASULF-NA ASC-C 100 G PO SOLR: 1.0000 | Freq: Once | ORAL | 0 refills | Status: AC

## 2021-05-20 NOTE — Progress Notes (Signed)
No allergies to soy or egg Pt is not on blood thinners or diet pills Denies issues with sedation/intubation Denies atrial flutter/fib Denies constipation   Pt is aware of Covid safety and care partner requirements.      

## 2021-05-21 ENCOUNTER — Telehealth: Payer: Self-pay | Admitting: Cardiology

## 2021-05-21 ENCOUNTER — Telehealth: Payer: Self-pay

## 2021-05-21 MED ORDER — FUROSEMIDE 20 MG PO TABS: 20.0000 mg | ORAL_TABLET | Freq: Every day | ORAL | 2 refills | Status: DC

## 2021-05-21 MED ORDER — POTASSIUM CHLORIDE CRYS ER 20 MEQ PO TBCR: 20.0000 meq | EXTENDED_RELEASE_TABLET | Freq: Every day | ORAL | 3 refills | Status: DC

## 2021-05-21 NOTE — Telephone Encounter (Signed)
Patient states his pcp, Dr. Lyndel Safe would like to put him on a diuretic and potassium. He would would like to know if Dr. Agustin Cree agrees with this. Please advise.

## 2021-05-21 NOTE — Telephone Encounter (Signed)
Below is Dr.Gupta's response sent via routing comment:  I figured out. It never went through. But let him know that it should be now in Pharmacy.  Also Ryan Cochran needs to call him with his Follow up appointment.     [name], MD  You 6 minutes ago (9:59 AM)     Our Epic was not working in the clinic yesterday so I did approve his Meds but I don't know why it is not showing in his Meds list.    Ryan Cochran called patient, that is what prompted this encounter.

## 2021-05-21 NOTE — Telephone Encounter (Signed)
Incoming call received from patient stating he was returning call to someone.   I placed patient on hold and consulted with [name] (Dr.Gupta's medical assistant) and she placed a call to patient to discuss future appointments and to inform him that she would mail is after visit summary from yesterday's visit.   Ryan Cochran inquired about 2 rx's that were to be sent to his pharmacy yesterday, lasix and potassium.   I asked patient the doses of those medications as they are on the medication list, but no dose is listed. Patient states he does not know as these are 2 new medications that Dr.Gupta prescribed as of yesterday.  Dr.Gupta please send rx's to pharmacy on file, [facility]

## 2021-05-21 NOTE — Telephone Encounter (Signed)
Spoke to the patient just now and let him know Dr. Wendy Poet recommendations. He verbalizes understanding. And thanks me for the call back.    Encouraged patient to call back with any questions or concerns.
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
## 2021-05-27 ENCOUNTER — Other Ambulatory Visit: Payer: Self-pay | Admitting: Internal Medicine

## 2021-05-27 DIAGNOSIS — R600 Localized edema: Secondary | ICD-10-CM

## 2021-05-28 ENCOUNTER — Other Ambulatory Visit: Payer: Self-pay

## 2021-05-28 DIAGNOSIS — R600 Localized edema: Secondary | ICD-10-CM

## 2021-05-29 LAB — BASIC METABOLIC PANEL WITH GFR
BUN: 23 mg/dL (ref 7–25)
CO2: 26 mmol/L (ref 20–32)
Calcium: 8.2 mg/dL — ABNORMAL LOW (ref 8.6–10.3)
Chloride: 108 mmol/L (ref 98–110)
Creat: 0.91 mg/dL (ref 0.70–1.22)
Glucose, Bld: 74 mg/dL (ref 65–99)
Potassium: 4.1 mmol/L (ref 3.5–5.3)
Sodium: 142 mmol/L (ref 135–146)
eGFR: 82 mL/min/{1.73_m2} (ref >=60)

## 2021-06-03 ENCOUNTER — Non-Acute Institutional Stay: Payer: Medicare Other | Admitting: Internal Medicine

## 2021-06-03 ENCOUNTER — Other Ambulatory Visit: Payer: Self-pay

## 2021-06-03 ENCOUNTER — Encounter: Payer: Self-pay | Admitting: Internal Medicine

## 2021-06-03 VITALS — BP 138/74 | HR 73 | Temp 96.5°F | Ht 69.5 in | Wt 146.4 lb

## 2021-06-03 DIAGNOSIS — R600 Localized edema: Secondary | ICD-10-CM | POA: Diagnosis not present

## 2021-06-03 DIAGNOSIS — I83029 Varicose veins of left lower extremity with ulcer of unspecified site: Secondary | ICD-10-CM | POA: Diagnosis not present

## 2021-06-03 DIAGNOSIS — I83019 Varicose veins of right lower extremity with ulcer of unspecified site: Secondary | ICD-10-CM | POA: Diagnosis not present

## 2021-06-03 DIAGNOSIS — L97919 Non-pressure chronic ulcer of unspecified part of right lower leg with unspecified severity: Secondary | ICD-10-CM

## 2021-06-03 DIAGNOSIS — E861 Hypovolemia: Secondary | ICD-10-CM

## 2021-06-03 DIAGNOSIS — I9589 Other hypotension: Secondary | ICD-10-CM

## 2021-06-03 DIAGNOSIS — L97929 Non-pressure chronic ulcer of unspecified part of left lower leg with unspecified severity: Secondary | ICD-10-CM

## 2021-06-03 NOTE — Anesthesia Preprocedure Evaluation (Addendum)
Anesthesia Evaluation  Patient identified by MRN, date of birth, ID band Patient awake    Reviewed: Allergy & Precautions, NPO status , Patient's Chart, lab work & pertinent test results  History of Anesthesia Complications Negative for: history of anesthetic complications  Airway Mallampati: II  TM Distance: >3 FB Neck ROM: Full    Dental  (+) Chipped, Dental Advisory Given   Pulmonary former smoker,    breath sounds clear to auscultation       Cardiovascular hypertension, (-) angina+ dysrhythmias Atrial Fibrillation  Rhythm:Irregular Rate:Normal  03/2021 ECHO: EF 60-65%, normal LVF, no significant valvular abnormalities   Neuro/Psych CVA, No Residual Symptoms negative psych ROS   GI/Hepatic Neg liver ROS, GI bleed   Endo/Other  diabetes (diet controlled)  Renal/GU Renal InsufficiencyRenal disease     Musculoskeletal  (+) Arthritis ,   Abdominal   Peds  Hematology Xarelto: last dose 2 days ago   Anesthesia Other Findings   Reproductive/Obstetrics                            Anesthesia Physical Anesthesia Plan  ASA: 3  Anesthesia Plan: MAC   Post-op Pain Management:    Induction:   PONV Risk Score and Plan: 1 and Treatment may vary due to age or medical condition  Airway Management Planned: Natural Airway and Simple Face Mask  Additional Equipment: None  Intra-op Plan:   Post-operative Plan:   Informed Consent: I have reviewed the patients History and Physical, chart, labs and discussed the procedure including the risks, benefits and alternatives for the proposed anesthesia with the patient or authorized representative who has indicated his/her understanding and acceptance.     Dental advisory given  Plan Discussed with: CRNA and Surgeon  Anesthesia Plan Comments:        Anesthesia Quick Evaluation
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
## 2021-06-04 ENCOUNTER — Ambulatory Visit (HOSPITAL_COMMUNITY): Payer: Medicare Other | Admitting: Anesthesiology

## 2021-06-04 ENCOUNTER — Encounter (HOSPITAL_COMMUNITY): Payer: Self-pay | Admitting: Internal Medicine

## 2021-06-04 ENCOUNTER — Other Ambulatory Visit: Payer: Self-pay

## 2021-06-04 ENCOUNTER — Encounter (HOSPITAL_COMMUNITY)
Admission: RE | Disposition: A | Discharge status: Home / Self Care | Payer: Self-pay | Source: Home / Self Care | Attending: Internal Medicine

## 2021-06-04 ENCOUNTER — Ambulatory Visit (HOSPITAL_COMMUNITY)
Admission: RE | Admit: 2021-06-04 | Discharge: 2021-06-04 | Disposition: A | Discharge status: Home / Self Care | Payer: Medicare Other | Attending: Internal Medicine | Admitting: Internal Medicine

## 2021-06-04 DIAGNOSIS — Z8546 Personal history of malignant neoplasm of prostate: Secondary | ICD-10-CM | POA: Diagnosis not present

## 2021-06-04 DIAGNOSIS — Z87891 Personal history of nicotine dependence: Secondary | ICD-10-CM | POA: Insufficient documentation

## 2021-06-04 DIAGNOSIS — Z7901 Long term (current) use of anticoagulants: Secondary | ICD-10-CM | POA: Insufficient documentation

## 2021-06-04 DIAGNOSIS — K635 Polyp of colon: Secondary | ICD-10-CM | POA: Diagnosis not present

## 2021-06-04 DIAGNOSIS — Z8673 Personal history of transient ischemic attack (TIA), and cerebral infarction without residual deficits: Secondary | ICD-10-CM | POA: Diagnosis not present

## 2021-06-04 DIAGNOSIS — Y842 Radiological procedure and radiotherapy as the cause of abnormal reaction of the patient, or of later complication, without mention of misadventure at the time of the procedure: Secondary | ICD-10-CM | POA: Diagnosis not present

## 2021-06-04 DIAGNOSIS — I4891 Unspecified atrial fibrillation: Secondary | ICD-10-CM | POA: Insufficient documentation

## 2021-06-04 DIAGNOSIS — D123 Benign neoplasm of transverse colon: Secondary | ICD-10-CM | POA: Diagnosis not present

## 2021-06-04 DIAGNOSIS — K5730 Diverticulosis of large intestine without perforation or abscess without bleeding: Secondary | ICD-10-CM | POA: Insufficient documentation

## 2021-06-04 DIAGNOSIS — K627 Radiation proctitis: Secondary | ICD-10-CM | POA: Insufficient documentation

## 2021-06-04 DIAGNOSIS — K625 Hemorrhage of anus and rectum: Secondary | ICD-10-CM | POA: Diagnosis not present

## 2021-06-04 DIAGNOSIS — K5520 Angiodysplasia of colon without hemorrhage: Secondary | ICD-10-CM | POA: Diagnosis not present

## 2021-06-04 DIAGNOSIS — D124 Benign neoplasm of descending colon: Secondary | ICD-10-CM | POA: Insufficient documentation

## 2021-06-04 DIAGNOSIS — D122 Benign neoplasm of ascending colon: Secondary | ICD-10-CM | POA: Diagnosis not present

## 2021-06-04 DIAGNOSIS — I482 Chronic atrial fibrillation, unspecified: Secondary | ICD-10-CM | POA: Diagnosis not present

## 2021-06-04 HISTORY — PX: COLONOSCOPY: SHX5424

## 2021-06-04 HISTORY — PX: POLYPECTOMY: SHX5525

## 2021-06-04 HISTORY — PX: HOT HEMOSTASIS: SHX5433

## 2021-06-04 SURGERY — COLONOSCOPY
Anesthesia: Monitor Anesthesia Care

## 2021-06-04 MED ORDER — SODIUM CHLORIDE 0.9 % IV SOLN: INTRAVENOUS | Status: DC

## 2021-06-04 MED ORDER — PROPOFOL 1000 MG/100ML IV EMUL
INTRAVENOUS | Status: AC
  Filled 2021-06-04: qty 100

## 2021-06-04 MED ORDER — PROPOFOL 500 MG/50ML IV EMUL
INTRAVENOUS | Status: DC | PRN
  Administered 2021-06-04: 200 ug/kg/min via INTRAVENOUS

## 2021-06-04 MED ORDER — LACTATED RINGERS IV SOLN: INTRAVENOUS | Status: DC

## 2021-06-04 NOTE — Op Note (Signed)
[facility] Patient Name: Ryan Cochran Procedure Date: 06/04/2021 MRN: [id number] Attending MD: Docia Chuck. Henrene Pastor , MD Date of Birth: 09-Mar-1933 CSN: [id number] Age: 87 Admit Type: Outpatient Procedure:                Colonoscopy with cold snare polypectomy x 6; with                            control of bleeding Indications:              Rectal bleeding. Radiation proctitis on                            proctoscopy. Recent CT colonoscopy negative Providers:                Docia Chuck. Henrene Pastor, MD, Jerene Pitch Person, Tyna Jaksch                            Technician Referring MD:             Billie Lade MD Medicines:                Monitored Anesthesia Care Complications:            No immediate complications. Estimated blood loss:                            None. Estimated Blood Loss:     Estimated blood loss: none. Procedure:                Pre-Anesthesia Assessment:                           - Prior to the procedure, a History and Physical                            was performed, and patient medications and                            allergies were reviewed. The patient's tolerance of                            previous anesthesia was also reviewed. The risks                            and benefits of the procedure and the sedation                            options and risks were discussed with the patient.                            All questions were answered, and informed consent                            was obtained. Prior Anticoagulants: The patient has  taken Xarelto (rivaroxaban), last dose was 2 days                            prior to procedure. ASA Grade Assessment: III - A                            patient with severe systemic disease. After                            reviewing the risks and benefits, the patient was                            deemed in satisfactory condition to undergo the                            procedure.                            After obtaining informed consent, the colonoscope                            was passed under direct vision. Throughout the                            procedure, the patient's blood pressure, pulse, and                            oxygen saturations were monitored continuously. The                            [id number]) Olympus colonoscope was                            introduced through the anus and advanced to the the                            cecum, identified by appendiceal orifice and                            ileocecal valve. The ileocecal valve, appendiceal                            orifice, and rectum were photographed. The quality                            of the bowel preparation was excellent. The                            colonoscopy was performed without difficulty. The                            patient tolerated the procedure well. The bowel  preparation used was SUPREP via split dose                            instruction. Scope In: 8:56:34 AM Scope Out: 9:30:14 AM Scope Withdrawal Time: 0 hours 24 minutes 43 seconds  Total Procedure Duration: 0 hours 33 minutes 40 seconds  Findings:      Six polyps, measuring between 3 and 20 mm, were found in the descending       colon, transverse colon and ascending colon. The largest polyps were in       the ascending colon. These were sessile and measured 10 and 20 mm       respectively. These polyps were removed with a cold snare. Resection and       retrieval were complete.      Diverticula were found in the sigmoid colon.      Radiation-induced angiodysplasia without bleeding were found in the       rectum. This began at the anal verge and extended approximately 3 cm.       This involved a third of the distal rectal circumference. Coagulation       for hemostasis using argon beam was successful. See images.      The exam was otherwise without abnormality on direct and retroflexion        views. Impression:               - Six 3 to 20 mm polyps in the descending colon, in                            the transverse colon and in the ascending colon,                            removed with a cold snare. Resected and retrieved.                           - Diverticulosis in the sigmoid colon.                           - Multiple non-bleeding colonic angioectasias                            (radiation proctitis). Treated with argon beam                            coagulation.                           - The examination was otherwise normal on direct                            and retroflexion views. Moderate Sedation:      none Recommendation:           - Repeat colonoscopy is not recommended for                            surveillance.                           -  Patient has a contact number available for                            emergencies. The signs and symptoms of potential                            delayed complications were discussed with the                            patient. Return to normal activities tomorrow.                            Written discharge instructions were provided to the                            patient.                           - Resume previous diet.                           - Continue present medications.                           - Await pathology results.                           -RESUME XARELTO TOMORROW                           -Please contact Dr. Hilarie Fredrickson if you have any further                            issues. I will forward today's information to Dr.                            Hilarie Fredrickson. He will decide if additional follow-up or                            treatment recommended.                           Patient received copy of this report and the                            findings were reviewed with him and his daughter. Procedure Code(s):        --- Professional ---                           [phone], 96, Colonoscopy,  flexible; with control of                            bleeding, any method                           45385, Colonoscopy, flexible; with removal of  tumor(s), polyp(s), or other lesion(s) by snare                            technique Diagnosis Code(s):        --- Professional ---                           K63.5, Polyp of colon                           K55.20, Angiodysplasia of colon without hemorrhage                           K62.5, Hemorrhage of anus and rectum                           K57.30, Diverticulosis of large intestine without                            perforation or abscess without bleeding CPT copyright 2019 American Medical Association. All rights reserved. The codes documented in this report are preliminary and upon coder review may  be revised to meet current compliance requirements. Docia Chuck. Henrene Pastor, MD 06/04/2021 9:56:16 AM This report has been signed electronically. Number of Addenda: 0

## 2021-06-04 NOTE — Anesthesia Procedure Notes (Signed)
Procedure Name: MAC Date/Time: 06/04/2021 8:42 AM Performed by: Lissa Morales, CRNA Pre-anesthesia Checklist: Patient identified, Emergency Drugs available, Suction available, Patient being monitored and Timeout performed Patient Re-evaluated:Patient Re-evaluated prior to induction Oxygen Delivery Method: Simple face mask Placement Confirmation: positive ETCO2

## 2021-06-04 NOTE — Transfer of Care (Signed)
Immediate Anesthesia Transfer of Care Note  Patient: Ryan Cochran.  Procedure(s) Performed: COLONOSCOPY HOT HEMOSTASIS (ARGON PLASMA COAGULATION/BICAP) POLYPECTOMY  Patient Location: PACU  Anesthesia Type:MAC  Level of Consciousness: awake, alert  and patient cooperative  Airway & Oxygen Therapy: Patient Spontanous Breathing and Patient connected to face mask oxygen  Post-op Assessment: Report given to RN, Post -op Vital signs reviewed and stable and Patient moving all extremities X 4  Post vital signs: stable  Last Vitals:  Vitals Value Taken Time  BP    Temp    Pulse    Resp    SpO2      Last Pain:  Vitals:   06/04/21 0721  TempSrc: Oral  PainSc: 0-No pain         Complications: No notable events documented.

## 2021-06-04 NOTE — Anesthesia Postprocedure Evaluation (Signed)
Anesthesia Post Note  Patient: Ryan Cochran.  Procedure(s) Performed: COLONOSCOPY HOT HEMOSTASIS (ARGON PLASMA COAGULATION/BICAP) POLYPECTOMY     Patient location during evaluation: PACU Anesthesia Type: MAC Level of consciousness: awake and alert, patient cooperative and oriented Pain management: pain level controlled Vital Signs Assessment: post-procedure vital signs reviewed and stable Respiratory status: spontaneous breathing, nonlabored ventilation and respiratory function stable Cardiovascular status: blood pressure returned to baseline and stable Postop Assessment: no apparent nausea or vomiting and able to ambulate Anesthetic complications: no   No notable events documented.  Last Vitals:  Vitals:   06/04/21 1000 06/04/21 1010  BP: 123/62   Pulse: (!) 25 68  Resp: 20 17  Temp:    SpO2: 93% 100%    Last Pain:  Vitals:   06/04/21 1000  TempSrc:   PainSc: 0-No pain                 Elias Bordner,E. Caleah Tortorelli
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
## 2021-06-04 NOTE — Discharge Instructions (Signed)

## 2021-06-05 ENCOUNTER — Encounter (HOSPITAL_COMMUNITY): Payer: Self-pay | Admitting: Internal Medicine

## 2021-06-08 ENCOUNTER — Telehealth: Payer: Self-pay | Admitting: Internal Medicine

## 2021-06-08 ENCOUNTER — Other Ambulatory Visit: Payer: Self-pay

## 2021-06-08 ENCOUNTER — Other Ambulatory Visit (INDEPENDENT_AMBULATORY_CARE_PROVIDER_SITE_OTHER): Payer: Medicare Other

## 2021-06-08 DIAGNOSIS — K625 Hemorrhage of anus and rectum: Secondary | ICD-10-CM

## 2021-06-08 LAB — CBC WITH DIFFERENTIAL/PLATELET
Basophils Absolute: 0 10*3/uL (ref 0.0–0.1)
Basophils Relative: 0.8 % (ref 0.0–3.0)
Eosinophils Absolute: 0.3 10*3/uL (ref 0.0–0.7)
Eosinophils Relative: 4.3 % (ref 0.0–5.0)
HCT: 31.1 % — ABNORMAL LOW (ref 39.0–52.0)
Hemoglobin: 10.1 g/dL — ABNORMAL LOW (ref 13.0–17.0)
Lymphocytes Relative: 18.3 % (ref 12.0–46.0)
Lymphs Abs: 1.1 10*3/uL (ref 0.7–4.0)
MCHC: 32.4 g/dL (ref 30.0–36.0)
MCV: 93.3 fl (ref 78.0–100.0)
Monocytes Absolute: 0.7 10*3/uL (ref 0.1–1.0)
Monocytes Relative: 11.5 % (ref 3.0–12.0)
Neutro Abs: 4 10*3/uL (ref 1.4–7.7)
Neutrophils Relative %: 65.1 % (ref 43.0–77.0)
Platelets: 198 10*3/uL (ref 150.0–400.0)
RBC: 3.33 Mil/uL — ABNORMAL LOW (ref 4.22–5.81)
RDW: 14.4 % (ref 11.5–15.5)
WBC: 6.1 10*3/uL (ref 4.0–10.5)

## 2021-06-08 LAB — SURGICAL PATHOLOGY

## 2021-06-08 NOTE — Telephone Encounter (Signed)
Patient of Dr. Hilarie Fredrickson, that I saw as a outpatient hospital yellow block add on. Usual treatment of radiation proctitis with APC as planned.  Also had multiple polyps removed with cold snare. Hold anticoagulation for now. Have him come in for CBC. Further recommendations per Dr. Hilarie Fredrickson, when he returns tomorrow. Thanks. Dr. Henrene Pastor

## 2021-06-08 NOTE — Telephone Encounter (Signed)
Pt calling, states he is worse than he was prior to the procedure. Reports he is seeing BRB all the time now. He feels it is not from the outside, feels he was "Stretched" from the colonoscopy and he is not happy. Please advise.

## 2021-06-08 NOTE — Telephone Encounter (Signed)
Spoke with pt and he is aware and will come in for CBC, order in epic. He knows to hold anticoagulation med. Note sent to Dr. Hilarie Fredrickson for his return tomorrow.

## 2021-06-09 ENCOUNTER — Telehealth: Payer: Self-pay | Admitting: Cardiology

## 2021-06-09 NOTE — Telephone Encounter (Signed)
Pt c/o medication issue: 1. Name of Medication: xarelto  2. How are you currently taking this medication (dosage and times per day)? Once a day 3. Are you having a reaction (difficulty breathing--STAT)?  No  4. What is your medication issue? Patient had surgery last week and states the doctor who did his surgery tod the patient to call to see how long can this medication be held due to him having some bleeding.

## 2021-06-09 NOTE — Telephone Encounter (Signed)
Spoke with pt and he is aware. Order in for labs in 2 weeks.

## 2021-06-09 NOTE — Telephone Encounter (Signed)
Please let patient know that having some ongoing rectal bleeding even after APC ablation for radiation proctitis is not unexpected This area will continue to heal and I agree with Dr. Henrene Pastor that staying off of Xarelto for about 7 days should give it sufficient time to heal Some patients need additional APC ablation treatments if radiation proctitis continues to bleed His hemoglobin is actually up a bit which is reassuring Lets repeat his hemoglobin in 2 weeks/CBC in 2 weeks

## 2021-06-10 NOTE — Telephone Encounter (Signed)
Spoke to the patient just now and let him know Dr. Wendy Poet recommendations. He verbalizes understanding and thanks me for the call back.    Encouraged patient to call back with any questions or concerns.

## 2021-06-10 NOTE — Telephone Encounter (Signed)
Follow up:   Patient returning a call back.  

## 2021-06-10 NOTE — Telephone Encounter (Signed)
Left message on patients voicemail to please return our call.   

## 2021-06-23 ENCOUNTER — Other Ambulatory Visit (INDEPENDENT_AMBULATORY_CARE_PROVIDER_SITE_OTHER): Payer: Medicare Other

## 2021-06-23 DIAGNOSIS — K625 Hemorrhage of anus and rectum: Secondary | ICD-10-CM

## 2021-06-23 LAB — CBC WITH DIFFERENTIAL/PLATELET
Basophils Absolute: 0 10*3/uL (ref 0.0–0.1)
Basophils Relative: 0.9 % (ref 0.0–3.0)
Eosinophils Absolute: 0.2 10*3/uL (ref 0.0–0.7)
Eosinophils Relative: 5.1 % — ABNORMAL HIGH (ref 0.0–5.0)
HCT: 34.8 % — ABNORMAL LOW (ref 39.0–52.0)
Hemoglobin: 11.4 g/dL — ABNORMAL LOW (ref 13.0–17.0)
Lymphocytes Relative: 19.2 % (ref 12.0–46.0)
Lymphs Abs: 0.9 10*3/uL (ref 0.7–4.0)
MCHC: 32.7 g/dL (ref 30.0–36.0)
MCV: 91.4 fl (ref 78.0–100.0)
Monocytes Absolute: 0.4 10*3/uL (ref 0.1–1.0)
Monocytes Relative: 9.3 % (ref 3.0–12.0)
Neutro Abs: 3.1 10*3/uL (ref 1.4–7.7)
Neutrophils Relative %: 65.5 % (ref 43.0–77.0)
Platelets: 214 10*3/uL (ref 150.0–400.0)
RBC: 3.81 Mil/uL — ABNORMAL LOW (ref 4.22–5.81)
RDW: 14.9 % (ref 11.5–15.5)
WBC: 4.7 10*3/uL (ref 4.0–10.5)

## 2021-06-24 ENCOUNTER — Other Ambulatory Visit: Payer: Self-pay

## 2021-06-24 DIAGNOSIS — K625 Hemorrhage of anus and rectum: Secondary | ICD-10-CM

## 2021-07-01 DIAGNOSIS — I83029 Varicose veins of left lower extremity with ulcer of unspecified site: Secondary | ICD-10-CM | POA: Diagnosis not present

## 2021-07-01 DIAGNOSIS — R600 Localized edema: Secondary | ICD-10-CM | POA: Diagnosis not present

## 2021-07-01 DIAGNOSIS — I83019 Varicose veins of right lower extremity with ulcer of unspecified site: Secondary | ICD-10-CM | POA: Diagnosis not present

## 2021-07-01 DIAGNOSIS — L97919 Non-pressure chronic ulcer of unspecified part of right lower leg with unspecified severity: Secondary | ICD-10-CM | POA: Diagnosis not present

## 2021-07-02 ENCOUNTER — Other Ambulatory Visit: Payer: Self-pay

## 2021-07-02 DIAGNOSIS — R600 Localized edema: Secondary | ICD-10-CM

## 2021-07-02 DIAGNOSIS — I83019 Varicose veins of right lower extremity with ulcer of unspecified site: Secondary | ICD-10-CM

## 2021-07-02 DIAGNOSIS — L97929 Non-pressure chronic ulcer of unspecified part of left lower leg with unspecified severity: Secondary | ICD-10-CM

## 2021-07-02 LAB — CBC WITH DIFFERENTIAL/PLATELET
Absolute Monocytes: 680 cells/uL (ref 200–950)
Basophils Absolute: 38 cells/uL (ref 0–200)
Basophils Relative: 0.7 %
Eosinophils Absolute: 297 cells/uL (ref 15–500)
Eosinophils Relative: 5.5 %
HCT: 34.8 % — ABNORMAL LOW (ref 38.5–50.0)
Hemoglobin: 11.3 g/dL — ABNORMAL LOW (ref 13.2–17.1)
Lymphs Abs: 1058 cells/uL (ref 850–3900)
MCH: 29.6 pg (ref 27.0–33.0)
MCHC: 32.5 g/dL (ref 32.0–36.0)
MCV: 91.1 fL (ref 80.0–100.0)
MPV: 10.5 fL (ref 7.5–12.5)
Monocytes Relative: 12.6 %
Neutro Abs: 3326 cells/uL (ref 1500–7800)
Neutrophils Relative %: 61.6 %
Platelets: 196 10*3/uL (ref 140–400)
RBC: 3.82 10*6/uL — ABNORMAL LOW (ref 4.20–5.80)
RDW: 12.8 % (ref 11.0–15.0)
Total Lymphocyte: 19.6 %
WBC: 5.4 10*3/uL (ref 3.8–10.8)

## 2021-07-02 LAB — BASIC METABOLIC PANEL WITH GFR
BUN/Creatinine Ratio: 30 (calc) — ABNORMAL HIGH (ref 6–22)
BUN: 31 mg/dL — ABNORMAL HIGH (ref 7–25)
CO2: 29 mmol/L (ref 20–32)
Calcium: 8.5 mg/dL — ABNORMAL LOW (ref 8.6–10.3)
Chloride: 108 mmol/L (ref 98–110)
Creat: 1.03 mg/dL (ref 0.70–1.22)
Glucose, Bld: 88 mg/dL (ref 65–99)
Potassium: 4.7 mmol/L (ref 3.5–5.3)
Sodium: 142 mmol/L (ref 135–146)
eGFR: 71 mL/min/{1.73_m2} (ref >=60)

## 2021-07-08 ENCOUNTER — Other Ambulatory Visit: Payer: Self-pay

## 2021-07-08 ENCOUNTER — Non-Acute Institutional Stay: Payer: Medicare Other | Admitting: Internal Medicine

## 2021-07-08 ENCOUNTER — Encounter: Payer: Self-pay | Admitting: Internal Medicine

## 2021-07-08 VITALS — BP 122/66 | HR 60 | Temp 96.4°F | Ht 69.0 in | Wt 149.8 lb

## 2021-07-08 DIAGNOSIS — E861 Hypovolemia: Secondary | ICD-10-CM

## 2021-07-08 DIAGNOSIS — I83019 Varicose veins of right lower extremity with ulcer of unspecified site: Secondary | ICD-10-CM | POA: Diagnosis not present

## 2021-07-08 DIAGNOSIS — I83029 Varicose veins of left lower extremity with ulcer of unspecified site: Secondary | ICD-10-CM

## 2021-07-08 DIAGNOSIS — I9589 Other hypotension: Secondary | ICD-10-CM

## 2021-07-08 DIAGNOSIS — R197 Diarrhea, unspecified: Secondary | ICD-10-CM

## 2021-07-08 DIAGNOSIS — I2510 Atherosclerotic heart disease of native coronary artery without angina pectoris: Secondary | ICD-10-CM | POA: Diagnosis not present

## 2021-07-08 DIAGNOSIS — C61 Malignant neoplasm of prostate: Secondary | ICD-10-CM

## 2021-07-08 DIAGNOSIS — E782 Mixed hyperlipidemia: Secondary | ICD-10-CM

## 2021-07-08 DIAGNOSIS — R600 Localized edema: Secondary | ICD-10-CM

## 2021-07-08 DIAGNOSIS — E1149 Type 2 diabetes mellitus with other diabetic neurological complication: Secondary | ICD-10-CM

## 2021-07-08 DIAGNOSIS — L97929 Non-pressure chronic ulcer of unspecified part of left lower leg with unspecified severity: Secondary | ICD-10-CM

## 2021-07-08 DIAGNOSIS — K625 Hemorrhage of anus and rectum: Secondary | ICD-10-CM

## 2021-07-08 DIAGNOSIS — L97919 Non-pressure chronic ulcer of unspecified part of right lower leg with unspecified severity: Secondary | ICD-10-CM
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
## 2021-07-14 ENCOUNTER — Other Ambulatory Visit: Payer: Self-pay | Admitting: Cardiology

## 2021-07-14 DIAGNOSIS — E1149 Type 2 diabetes mellitus with other diabetic neurological complication: Secondary | ICD-10-CM

## 2021-07-14 DIAGNOSIS — I2510 Atherosclerotic heart disease of native coronary artery without angina pectoris: Secondary | ICD-10-CM

## 2021-07-14 DIAGNOSIS — E782 Mixed hyperlipidemia: Secondary | ICD-10-CM

## 2021-07-14 DIAGNOSIS — I482 Chronic atrial fibrillation, unspecified: Secondary | ICD-10-CM

## 2021-07-15 ENCOUNTER — Encounter: Payer: Self-pay | Admitting: Medical Oncology

## 2021-07-20 DIAGNOSIS — H524 Presbyopia: Secondary | ICD-10-CM | POA: Diagnosis not present

## 2021-07-23 ENCOUNTER — Other Ambulatory Visit (INDEPENDENT_AMBULATORY_CARE_PROVIDER_SITE_OTHER): Payer: Medicare Other

## 2021-07-23 ENCOUNTER — Other Ambulatory Visit: Payer: Self-pay

## 2021-07-23 DIAGNOSIS — K625 Hemorrhage of anus and rectum: Secondary | ICD-10-CM

## 2021-07-23 DIAGNOSIS — Z8719 Personal history of other diseases of the digestive system: Secondary | ICD-10-CM

## 2021-07-23 LAB — HEMOGLOBIN: Hemoglobin: 11.4 g/dL — ABNORMAL LOW (ref 13.0–17.0)

## 2021-08-04 ENCOUNTER — Other Ambulatory Visit: Payer: Self-pay | Admitting: Nurse Practitioner

## 2021-08-06 DIAGNOSIS — Z23 Encounter for immunization: Secondary | ICD-10-CM | POA: Diagnosis not present

## 2021-08-16 ENCOUNTER — Other Ambulatory Visit: Payer: Self-pay | Admitting: Internal Medicine

## 2021-08-24 ENCOUNTER — Other Ambulatory Visit (INDEPENDENT_AMBULATORY_CARE_PROVIDER_SITE_OTHER): Payer: Medicare Other

## 2021-08-24 DIAGNOSIS — K625 Hemorrhage of anus and rectum: Secondary | ICD-10-CM

## 2021-08-24 DIAGNOSIS — Z8719 Personal history of other diseases of the digestive system: Secondary | ICD-10-CM

## 2021-08-24 LAB — HEMOGLOBIN: Hemoglobin: 12.8 g/dL — ABNORMAL LOW (ref 13.0–17.0)

## 2021-08-24 LAB — FERRITIN: Ferritin: 28.2 ng/mL (ref 22.0–322.0)

## 2021-08-26 ENCOUNTER — Other Ambulatory Visit: Payer: Self-pay

## 2021-08-26 DIAGNOSIS — K625 Hemorrhage of anus and rectum: Secondary | ICD-10-CM

## 2021-09-04 ENCOUNTER — Other Ambulatory Visit: Payer: Self-pay | Admitting: Cardiology

## 2021-09-04 NOTE — Telephone Encounter (Signed)
Prescription refill request for Xarelto received.  Indication:Afib Last office visit:5/22 Weight:67.9 kg Age:87 Scr:1.0 CrCl:50.93  Prescription refilled

## 2021-09-07 ENCOUNTER — Other Ambulatory Visit: Payer: Self-pay | Admitting: Internal Medicine

## 2021-09-22 DIAGNOSIS — C61 Malignant neoplasm of prostate: Secondary | ICD-10-CM | POA: Diagnosis not present

## 2021-09-28 ENCOUNTER — Encounter: Payer: Self-pay | Admitting: Cardiology

## 2021-09-28 ENCOUNTER — Ambulatory Visit: Payer: Medicare Other | Admitting: Cardiology

## 2021-09-28 ENCOUNTER — Other Ambulatory Visit: Payer: Self-pay

## 2021-09-28 VITALS — BP 136/60 | HR 109 | Ht 69.75 in | Wt 156.0 lb

## 2021-09-28 DIAGNOSIS — I482 Chronic atrial fibrillation, unspecified: Secondary | ICD-10-CM

## 2021-09-28 DIAGNOSIS — E782 Mixed hyperlipidemia: Secondary | ICD-10-CM | POA: Diagnosis not present

## 2021-09-28 DIAGNOSIS — I10 Essential (primary) hypertension: Secondary | ICD-10-CM | POA: Diagnosis not present

## 2021-09-28 DIAGNOSIS — I2510 Atherosclerotic heart disease of native coronary artery without angina pectoris: Secondary | ICD-10-CM | POA: Diagnosis not present

## 2021-09-28 NOTE — Patient Instructions (Signed)
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
## 2021-09-29 DIAGNOSIS — R351 Nocturia: Secondary | ICD-10-CM | POA: Diagnosis not present

## 2021-09-29 DIAGNOSIS — Z5111 Encounter for antineoplastic chemotherapy: Secondary | ICD-10-CM | POA: Diagnosis not present

## 2021-09-29 DIAGNOSIS — C61 Malignant neoplasm of prostate: Secondary | ICD-10-CM | POA: Diagnosis not present

## 2021-10-15 ENCOUNTER — Other Ambulatory Visit: Payer: Self-pay

## 2021-10-15 DIAGNOSIS — R600 Localized edema: Secondary | ICD-10-CM

## 2021-10-15 DIAGNOSIS — E782 Mixed hyperlipidemia: Secondary | ICD-10-CM | POA: Diagnosis not present

## 2021-10-15 DIAGNOSIS — E1149 Type 2 diabetes mellitus with other diabetic neurological complication: Secondary | ICD-10-CM

## 2021-10-16 LAB — LIPID PANEL
Cholesterol: 119 mg/dL (ref <200)
HDL: 39 mg/dL — ABNORMAL LOW (ref >=40)
LDL Cholesterol (Calc): 59 mg/dL (calc)
Non-HDL Cholesterol (Calc): 80 mg/dL (calc) (ref <130)
Total CHOL/HDL Ratio: 3.1 (calc) (ref <5.0)
Triglycerides: 120 mg/dL (ref <150)

## 2021-10-16 LAB — COMPLETE METABOLIC PANEL WITH GFR
AG Ratio: 1.5 (calc) (ref 1.0–2.5)
ALT: 14 U/L (ref 9–46)
AST: 12 U/L (ref 10–35)
Albumin: 3.3 g/dL — ABNORMAL LOW (ref 3.6–5.1)
Alkaline phosphatase (APISO): 51 U/L (ref 35–144)
BUN: 25 mg/dL (ref 7–25)
CO2: 29 mmol/L (ref 20–32)
Calcium: 8.8 mg/dL (ref 8.6–10.3)
Chloride: 106 mmol/L (ref 98–110)
Creat: 1.02 mg/dL (ref 0.70–1.22)
Globulin: 2.2 g/dL (calc) (ref 1.9–3.7)
Glucose, Bld: 100 mg/dL — ABNORMAL HIGH (ref 65–99)
Potassium: 4.2 mmol/L (ref 3.5–5.3)
Sodium: 141 mmol/L (ref 135–146)
Total Bilirubin: 0.4 mg/dL (ref 0.2–1.2)
Total Protein: 5.5 g/dL — ABNORMAL LOW (ref 6.1–8.1)
eGFR: 72 mL/min/{1.73_m2} (ref >=60)

## 2021-10-16 LAB — HEMOGLOBIN A1C
Hgb A1c MFr Bld: 5.9 % of total Hgb — ABNORMAL HIGH (ref <5.7)
Mean Plasma Glucose: 123 mg/dL
eAG (mmol/L): 6.8 mmol/L

## 2021-10-21 ENCOUNTER — Other Ambulatory Visit: Payer: Self-pay

## 2021-10-21 ENCOUNTER — Encounter: Payer: Self-pay | Admitting: Internal Medicine

## 2021-10-21 ENCOUNTER — Non-Acute Institutional Stay: Payer: Medicare Other | Admitting: Internal Medicine

## 2021-10-21 VITALS — BP 115/52 | HR 69 | Temp 96.6°F | Ht 69.75 in | Wt 150.4 lb

## 2021-10-21 DIAGNOSIS — C61 Malignant neoplasm of prostate: Secondary | ICD-10-CM

## 2021-10-21 DIAGNOSIS — R600 Localized edema: Secondary | ICD-10-CM | POA: Diagnosis not present

## 2021-10-21 DIAGNOSIS — K625 Hemorrhage of anus and rectum: Secondary | ICD-10-CM | POA: Diagnosis not present

## 2021-10-21 DIAGNOSIS — E782 Mixed hyperlipidemia: Secondary | ICD-10-CM

## 2021-10-21 DIAGNOSIS — E1149 Type 2 diabetes mellitus with other diabetic neurological complication: Secondary | ICD-10-CM | POA: Diagnosis not present

## 2021-10-21 DIAGNOSIS — I482 Chronic atrial fibrillation, unspecified: Secondary | ICD-10-CM

## 2021-10-21 DIAGNOSIS — Z8673 Personal history of transient ischemic attack (TIA), and cerebral infarction without residual deficits: Secondary | ICD-10-CM
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
## 2021-11-04 ENCOUNTER — Encounter: Payer: Medicare Other | Admitting: Internal Medicine

## 2021-11-23 ENCOUNTER — Other Ambulatory Visit (INDEPENDENT_AMBULATORY_CARE_PROVIDER_SITE_OTHER): Payer: Medicare Other

## 2021-11-23 DIAGNOSIS — K625 Hemorrhage of anus and rectum: Secondary | ICD-10-CM

## 2021-11-23 LAB — CBC WITH DIFFERENTIAL/PLATELET
Basophils Absolute: 0 10*3/uL (ref 0.0–0.1)
Basophils Relative: 0.6 % (ref 0.0–3.0)
Eosinophils Absolute: 0.1 10*3/uL (ref 0.0–0.7)
Eosinophils Relative: 2.3 % (ref 0.0–5.0)
HCT: 40.3 % (ref 39.0–52.0)
Hemoglobin: 13.3 g/dL (ref 13.0–17.0)
Lymphocytes Relative: 22 % (ref 12.0–46.0)
Lymphs Abs: 1.2 10*3/uL (ref 0.7–4.0)
MCHC: 33 g/dL (ref 30.0–36.0)
MCV: 95.6 fl (ref 78.0–100.0)
Monocytes Absolute: 0.6 10*3/uL (ref 0.1–1.0)
Monocytes Relative: 9.7 % (ref 3.0–12.0)
Neutro Abs: 3.7 10*3/uL (ref 1.4–7.7)
Neutrophils Relative %: 65.4 % (ref 43.0–77.0)
Platelets: 178 10*3/uL (ref 150.0–400.0)
RBC: 4.22 Mil/uL (ref 4.22–5.81)
RDW: 13.9 % (ref 11.5–15.5)
WBC: 5.7 10*3/uL (ref 4.0–10.5)

## 2021-11-23 LAB — FERRITIN: Ferritin: 30.7 ng/mL (ref 22.0–322.0)

## 2022-01-19 ENCOUNTER — Encounter: Payer: Self-pay | Admitting: Nurse Practitioner

## 2022-01-19 ENCOUNTER — Other Ambulatory Visit: Payer: Self-pay

## 2022-01-19 ENCOUNTER — Ambulatory Visit (INDEPENDENT_AMBULATORY_CARE_PROVIDER_SITE_OTHER): Payer: Medicare Other | Admitting: Nurse Practitioner

## 2022-01-19 DIAGNOSIS — Z0000 Encounter for general adult medical examination without abnormal findings: Secondary | ICD-10-CM | POA: Diagnosis not present

## 2022-01-19 NOTE — Progress Notes (Signed)
This service is provided via telemedicine ? ?No vital signs collected/recorded due to the encounter was a telemedicine visit.  ? ?Location of patient (ex: home, work):  Home ? ?Patient consents to a telephone visit:  Yes, see encounter dated 01/19/2022 ? ?Location of the provider (ex: office, home):  [place] ? ?Name of any referring provider:  Veleta Miners, MD ? ?Names of all persons participating in the telemedicine service and their role in the encounter:  Sherrie Mustache, Nurse Practitioner, Carroll Kinds, CMA, and patient.  ? ?Time spent on call:  9 minutes with medical assistant ? ?

## 2022-01-19 NOTE — Patient Instructions (Signed)
Ryan Cochran , ?Thank you for taking time to come for your Medicare Wellness Visit. I appreciate your ongoing commitment to your health goals. Please review the following plan we discussed and let me know if I can assist you in the future.  ? ?Screening recommendations/referrals: ?Colonoscopy aged out ?Recommended yearly ophthalmology/optometry visit for glaucoma screening and checkup ?Recommended yearly dental visit for hygiene and checkup ? ?Vaccinations: ?Influenza vaccine up to date ?Pneumococcal vaccine  up to date ?Tdap vaccine up to date ?Shingles vaccine up to date   ? ?Advanced directives: on file ? ?Conditions/risks identified: advance age, diabetes, hyperlipidemia, hypertension ? ?Next appointment: yearly for awv ? ?Preventive Care 90 Years and Older, Male ?Preventive care refers to lifestyle choices and visits with your health care provider that can promote health and wellness. ?What does preventive care include? ?A yearly physical exam. This is also called an annual well check. ?Dental exams once or twice a year. ?Routine eye exams. Ask your health care provider how often you should have your eyes checked. ?Personal lifestyle choices, including: ?Daily care of your teeth and gums. ?Regular physical activity. ?Eating a healthy diet. ?Avoiding tobacco and drug use. ?Limiting alcohol use. ?Practicing safe sex. ?Taking low doses of aspirin every day. ?Taking vitamin and mineral supplements as recommended by your health care provider. ?What happens during an annual well check? ?The services and screenings done by your health care provider during your annual well check will depend on your age, overall health, lifestyle risk factors, and family history of disease. ?Counseling  ?Your health care provider may ask you questions about your: ?Alcohol use. ?Tobacco use. ?Drug use. ?Emotional well-being. ?Home and relationship well-being. ?Sexual activity. ?Eating habits. ?History of falls. ?Memory and ability to  understand (cognition). ?Work and work Statistician. ?Screening  ?You may have the following tests or measurements: ?Height, weight, and BMI. ?Blood pressure. ?Lipid and cholesterol levels. These may be checked every 5 years, or more frequently if you are over 107 years old. ?Skin check. ?Lung cancer screening. You may have this screening every year starting at age 78 if you have a 30-pack-year history of smoking and currently smoke or have quit within the past 15 years. ?Fecal occult blood test (FOBT) of the stool. You may have this test every year starting at age 66. ?Flexible sigmoidoscopy or colonoscopy. You may have a sigmoidoscopy every 5 years or a colonoscopy every 10 years starting at age 13. ?Prostate cancer screening. Recommendations will vary depending on your family history and other risks. ?Hepatitis C blood test. ?Hepatitis B blood test. ?Sexually transmitted disease (STD) testing. ?Diabetes screening. This is done by checking your blood sugar (glucose) after you have not eaten for a while (fasting). You may have this done every 1-3 years. ?Abdominal aortic aneurysm (AAA) screening. You may need this if you are a current or former smoker. ?Osteoporosis. You may be screened starting at age 97 if you are at high risk. ?Talk with your health care provider about your test results, treatment options, and if necessary, the need for more tests. ?Vaccines  ?Your health care provider may recommend certain vaccines, such as: ?Influenza vaccine. This is recommended every year. ?Tetanus, diphtheria, and acellular pertussis (Tdap, Td) vaccine. You may need a Td booster every 10 years. ?Zoster vaccine. You may need this after age 38. ?Pneumococcal 13-valent conjugate (PCV13) vaccine. One dose is recommended after age 23. ?Pneumococcal polysaccharide (PPSV23) vaccine. One dose is recommended after age 54. ?Talk to your health care provider about  which screenings and vaccines you need and how often you need them. ?This  information is not intended to replace advice given to you by your health care provider. Make sure you discuss any questions you have with your health care provider. ?Document Released: 11/14/2015 Document Revised: 07/07/2016 Document Reviewed: 08/19/2015 ?Elsevier Interactive Patient Education ? 2017 [place]. ? ?Fall Prevention in the Home ?Falls can cause injuries. They can happen to people of all ages. There are many things you can do to make your home safe and to help prevent falls. ?What can I do on the outside of my home? ?Regularly fix the edges of walkways and driveways and fix any cracks. ?Remove anything that might make you trip as you walk through a door, such as a raised step or threshold. ?Trim any bushes or trees on the path to your home. ?Use bright outdoor lighting. ?Clear any walking paths of anything that might make someone trip, such as rocks or tools. ?Regularly check to see if handrails are loose or broken. Make sure that both sides of any steps have handrails. ?Any raised decks and porches should have guardrails on the edges. ?Have any leaves, snow, or ice cleared regularly. ?Use sand or salt on walking paths during winter. ?Clean up any spills in your garage right away. This includes oil or grease spills. ?What can I do in the bathroom? ?Use night lights. ?Install grab bars by the toilet and in the tub and shower. Do not use towel bars as grab bars. ?Use non-skid mats or decals in the tub or shower. ?If you need to sit down in the shower, use a plastic, non-slip stool. ?Keep the floor dry. Clean up any water that spills on the floor as soon as it happens. ?Remove soap buildup in the tub or shower regularly. ?Attach bath mats securely with double-sided non-slip rug tape. ?Do not have throw rugs and other things on the floor that can make you trip. ?What can I do in the bedroom? ?Use night lights. ?Make sure that you have a light by your bed that is easy to reach. ?Do not use any sheets or  blankets that are too big for your bed. They should not hang down onto the floor. ?Have a firm chair that has side arms. You can use this for support while you get dressed. ?Do not have throw rugs and other things on the floor that can make you trip. ?What can I do in the kitchen? ?Clean up any spills right away. ?Avoid walking on wet floors. ?Keep items that you use a lot in easy-to-reach places. ?If you need to reach something above you, use a strong step stool that has a grab bar. ?Keep electrical cords out of the way. ?Do not use floor polish or wax that makes floors slippery. If you must use wax, use non-skid floor wax. ?Do not have throw rugs and other things on the floor that can make you trip. ?What can I do with my stairs? ?Do not leave any items on the stairs. ?Make sure that there are handrails on both sides of the stairs and use them. Fix handrails that are broken or loose. Make sure that handrails are as long as the stairways. ?Check any carpeting to make sure that it is firmly attached to the stairs. Fix any carpet that is loose or worn. ?Avoid having throw rugs at the top or bottom of the stairs. If you do have throw rugs, attach them to the floor with  carpet tape. ?Make sure that you have a light switch at the top of the stairs and the bottom of the stairs. If you do not have them, ask someone to add them for you. ?What else can I do to help prevent falls? ?Wear shoes that: ?Do not have high heels. ?Have rubber bottoms. ?Are comfortable and fit you well. ?Are closed at the toe. Do not wear sandals. ?If you use a stepladder: ?Make sure that it is fully opened. Do not climb a closed stepladder. ?Make sure that both sides of the stepladder are locked into place. ?Ask someone to hold it for you, if possible. ?Clearly mark and make sure that you can see: ?Any grab bars or handrails. ?First and last steps. ?Where the edge of each step is. ?Use tools that help you move around (mobility aids) if they are  needed. These include: ?Canes. ?Walkers. ?Scooters. ?Crutches. ?Turn on the lights when you go into a dark area. Replace any light bulbs as soon as they burn out. ?Set up your furniture so you have a clear pa
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
## 2022-01-24 IMAGING — CT CT VIRTUAL COLONOSCOPY DIAGNOSTIC
2 of 9 series · 12 of 46 positions shown, 14 images · non-contrast
Comparison: 02/20/2020

CLINICAL DATA: 25 lb weight loss. Diarrhea. History of bowel
resection. Prostate cancer.

EXAM:
CT VIRTUAL COLONOSCOPY DIAGNOSTIC
TECHNIQUE: The patient was given a standard bowel preparation with Gastrografin
and barium for fluid and stool tagging respectively. The quality of
the bowel preparation is poor with large amount of retained layering
barium and barium tagged stool. Automated CO2 insufflation of the
colon was performed prior to image acquisition and colonic
distention is good. Image post processing was used to generate a 3D
endoluminal fly-through projection of the colon and to
electronically subtract stool/fluid as appropriate.

[Series 5: supine colon 3.00 br40 s3 cor supine · coronal · 0.73mm/px · 3 of 94 slices shown]
[im 24/94  soft-tissue]
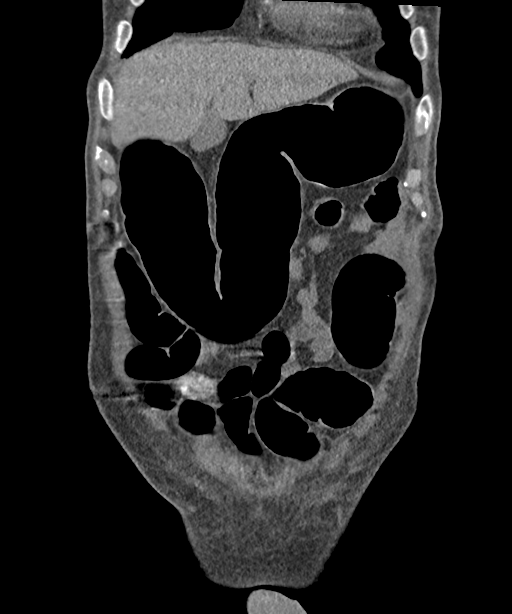
[im 47/94  soft-tissue]
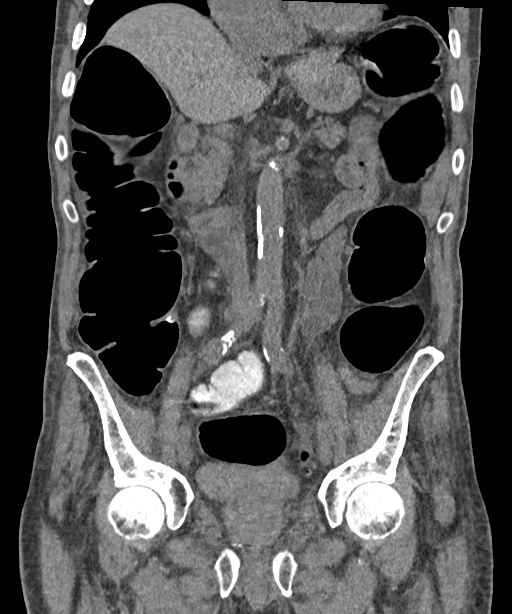
[im 70/94  soft-tissue]
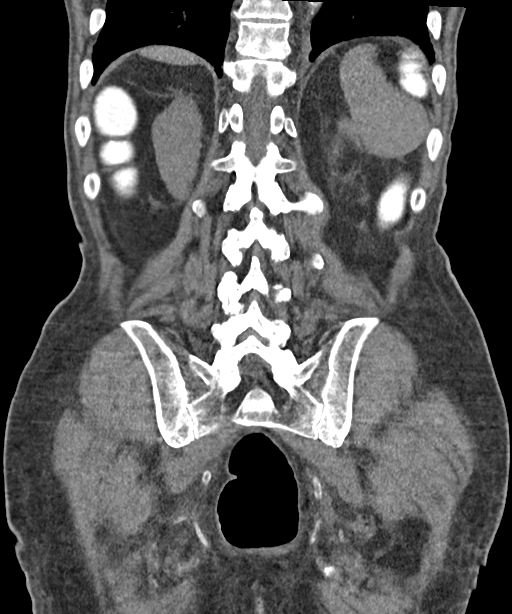

[Series 10: prone colon 1.50 br40 s3 prone thin · axial · 0.63mm/px · z∈[+1198,+1569]mm · 9 of 309 slices shown, 11 images]
[im 31/309  soft-tissue]
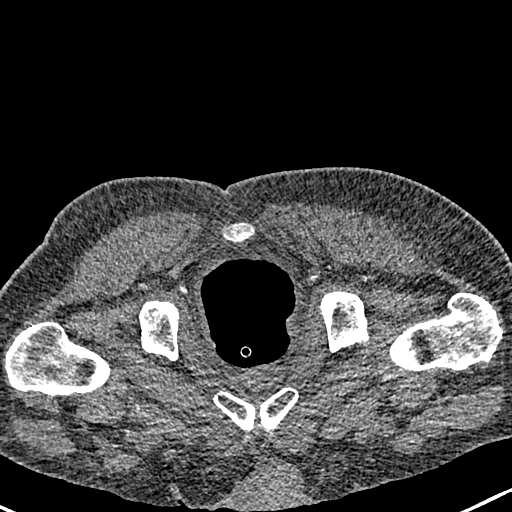
[im 31/309  bone]
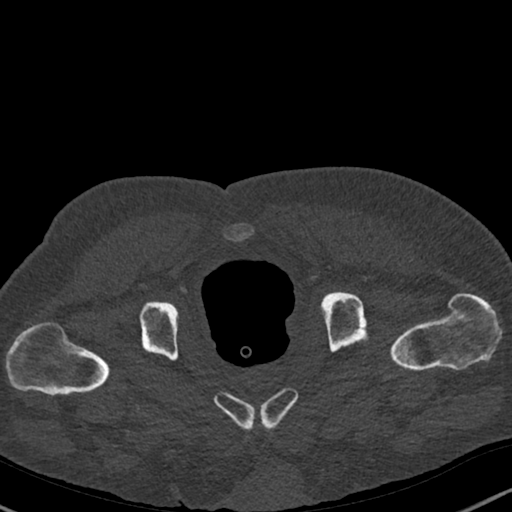
[im 62/309  soft-tissue]
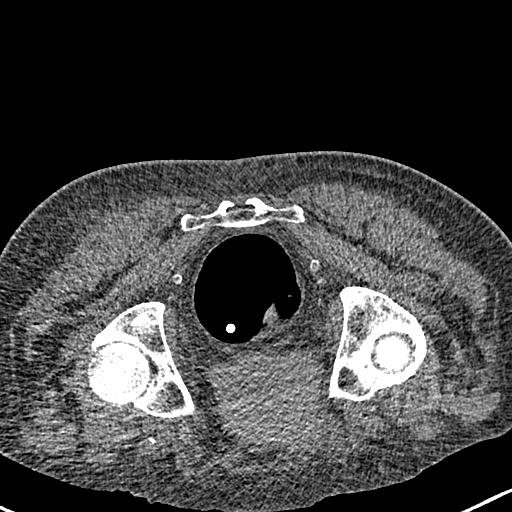
[im 93/309  soft-tissue]
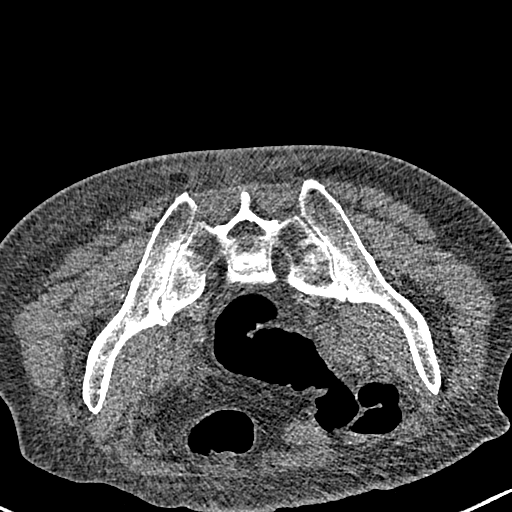
[im 124/309  soft-tissue]
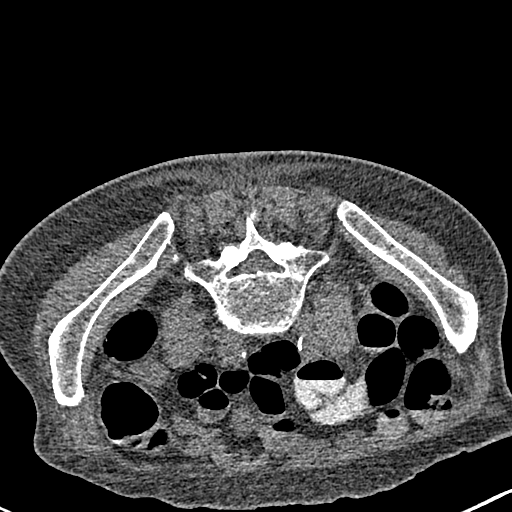
[im 155/309  soft-tissue]
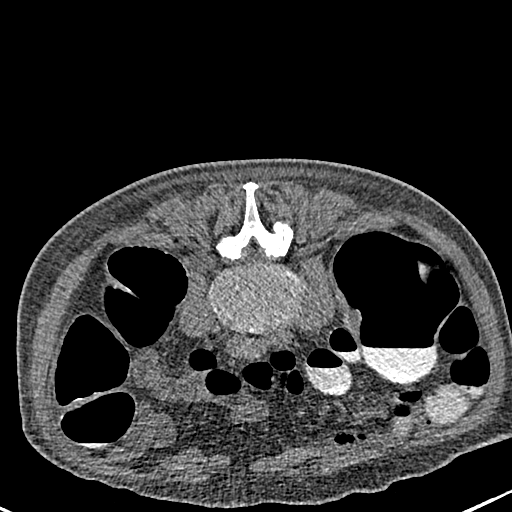
[im 185/309  soft-tissue]
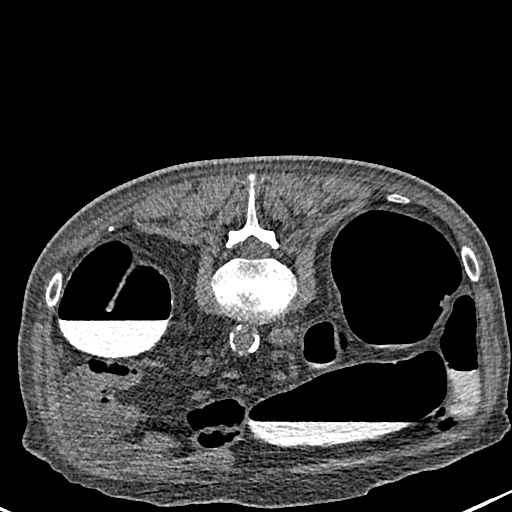
[im 216/309  soft-tissue]
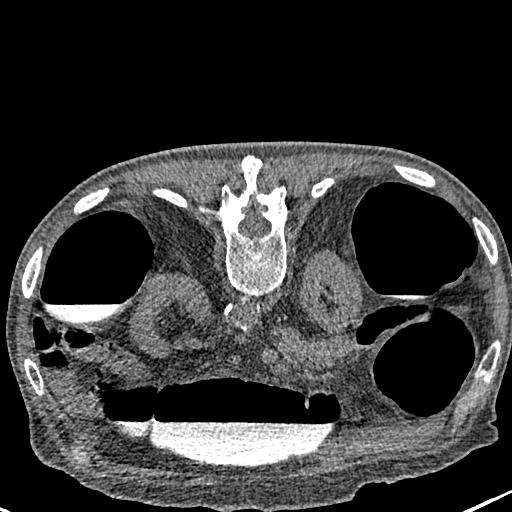
[im 247/309  soft-tissue]
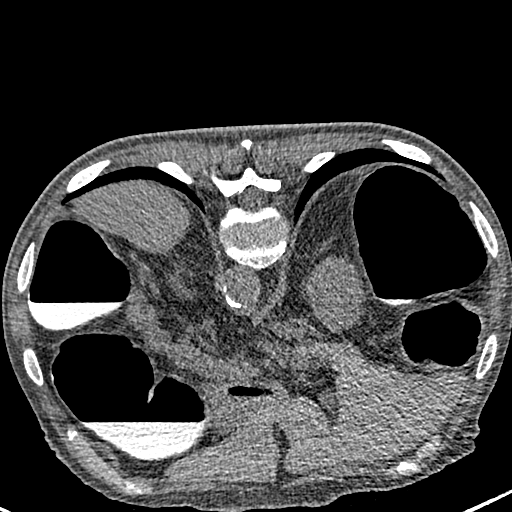
[im 278/309  soft-tissue]
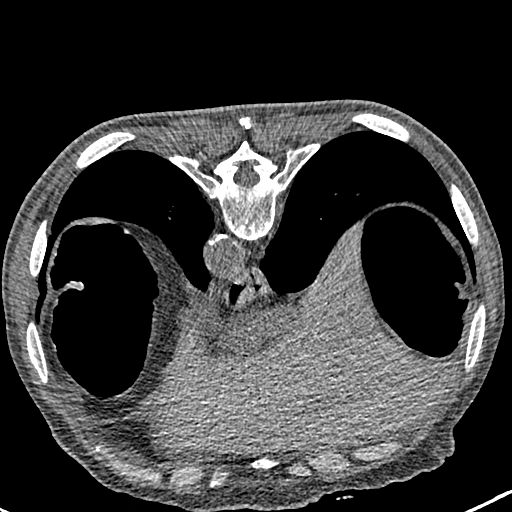
[im 278/309  bone]
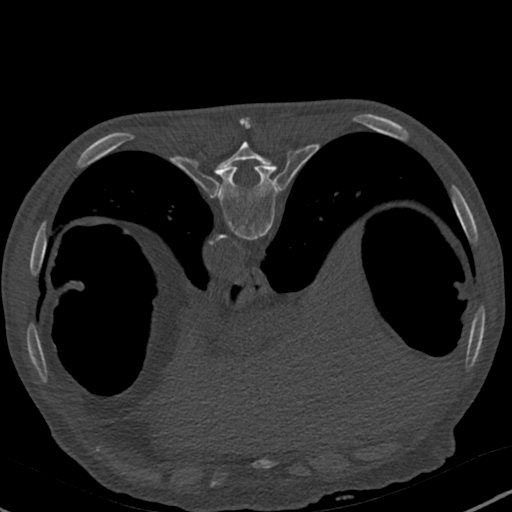

[12 of 46 positions shown; findings below may reference images not displayed]

FINDINGS: VIRTUAL COLONOSCOPY

Large amount of retained layering barium throughout the colon.
Moderate retained adherent barium tagged stool within the sigmoid
colon and descending colon. Scattered sigmoid diverticula with under
distention of the sigmoid colon. No fixed non barium tagged polypoid
filling defects or annular constricting lesions.

Virtual colonoscopy is not designed to detect diminutive polyps
(i.e., less than or equal to 5 mm), the presence or absence of which
may not affect clinical management.

CT ABDOMEN AND PELVIS WITHOUT CONTRAST

Lower chest: Trace bilateral pleural effusions.

Hepatobiliary: No focal hepatic abnormality. Gallbladder
unremarkable.

Pancreas: No focal abnormality or ductal dilatation.

Spleen: No focal abnormality.  Normal size.

Adrenals/Urinary Tract: No adrenal abnormality. No focal renal
abnormality. No stones or hydronephrosis. Urinary bladder is
unremarkable.

Stomach/Bowel: Stomach and small bowel decompressed, grossly
unremarkable.

Vascular/Lymphatic: Aortic atherosclerosis. No evidence of aneurysm
or adenopathy.

Reproductive: Prostate enlargement. Scattered surgical clips or
radiation seeds in the region of the prostate.

Other: No free fluid or free air.

Musculoskeletal: No acute bony abnormality.
IMPRESSION: No visible fixed non barium tagged polypoid filling defects or
annular constricting lesions. Moderate retained barium tagged stool
throughout the colon.

Under distention of the sigmoid colon with diverticular disease.

Trace bilateral pleural effusions.

Aortic atherosclerosis.

## 2022-02-01 ENCOUNTER — Other Ambulatory Visit: Payer: Self-pay | Admitting: Internal Medicine

## 2022-02-22 ENCOUNTER — Other Ambulatory Visit: Payer: Self-pay | Admitting: Cardiology

## 2022-02-25 ENCOUNTER — Encounter: Payer: Medicare Other | Admitting: Nurse Practitioner

## 2022-03-16 DIAGNOSIS — C61 Malignant neoplasm of prostate: Secondary | ICD-10-CM | POA: Diagnosis not present

## 2022-03-23 DIAGNOSIS — R351 Nocturia: Secondary | ICD-10-CM | POA: Diagnosis not present

## 2022-03-23 DIAGNOSIS — C61 Malignant neoplasm of prostate: Secondary | ICD-10-CM | POA: Diagnosis not present

## 2022-03-24 DIAGNOSIS — H903 Sensorineural hearing loss, bilateral: Secondary | ICD-10-CM | POA: Diagnosis not present

## 2022-03-25 ENCOUNTER — Ambulatory Visit: Payer: Medicare Other | Admitting: Cardiology

## 2022-03-25 ENCOUNTER — Encounter: Payer: Self-pay | Admitting: Cardiology

## 2022-03-25 VITALS — BP 114/60 | HR 46 | Ht 69.5 in | Wt 161.0 lb

## 2022-03-25 DIAGNOSIS — I482 Chronic atrial fibrillation, unspecified: Secondary | ICD-10-CM

## 2022-03-25 DIAGNOSIS — I10 Essential (primary) hypertension: Secondary | ICD-10-CM

## 2022-03-25 DIAGNOSIS — E1140 Type 2 diabetes mellitus with diabetic neuropathy, unspecified: Secondary | ICD-10-CM | POA: Diagnosis not present

## 2022-03-25 DIAGNOSIS — I2510 Atherosclerotic heart disease of native coronary artery without angina pectoris: Secondary | ICD-10-CM | POA: Diagnosis not present

## 2022-03-25 DIAGNOSIS — E782 Mixed hyperlipidemia: Secondary | ICD-10-CM

## 2022-03-25 NOTE — Patient Instructions (Signed)
,  Medication Instructions:  Your physician recommends that you continue on your current medications as directed. Please refer to the Current Medication list given to you today.  *If you need a refill on your cardiac medications before your next appointment, please call your pharmacy*   Lab Work: None Ordered If you have labs (blood work) drawn today and your tests are completely normal, you will receive your results only by: MyChart Message (if you have MyChart) OR A paper copy in the mail If you have any lab test that is abnormal or we need to change your treatment, we will call you to review the results.   Testing/Procedures: None Ordered   Follow-Up: At [facility], you and your health needs are our priority.  As part of our continuing mission to provide you with exceptional heart care, we have created designated Provider Care Teams.  These Care Teams include your primary Cardiologist (physician) and Advanced Practice Providers (APPs -  Physician Assistants and Nurse Practitioners) who all work together to provide you with the care you need, when you need it.  We recommend signing up for the patient portal called "MyChart".  Sign up information is provided on this After Visit Summary.  MyChart is used to connect with patients for Virtual Visits (Telemedicine).  Patients are able to view lab/test results, encounter notes, upcoming appointments, etc.  Non-urgent messages can be sent to your provider as well.   To learn more about what you can do with MyChart, go to [email or web address].    Your next appointment:   6 month(s)  The format for your next appointment:   In Person  Provider:   Robert Krasowski, MD    Other Instructions NA  
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
## 2022-03-31 ENCOUNTER — Other Ambulatory Visit: Payer: Self-pay | Admitting: Cardiology

## 2022-03-31 DIAGNOSIS — E1149 Type 2 diabetes mellitus with other diabetic neurological complication: Secondary | ICD-10-CM

## 2022-03-31 DIAGNOSIS — I2510 Atherosclerotic heart disease of native coronary artery without angina pectoris: Secondary | ICD-10-CM

## 2022-03-31 DIAGNOSIS — I482 Chronic atrial fibrillation, unspecified: Secondary | ICD-10-CM

## 2022-03-31 DIAGNOSIS — E782 Mixed hyperlipidemia: Secondary | ICD-10-CM

## 2022-04-01 NOTE — Telephone Encounter (Signed)
Prescription refill request for Xarelto received.  Indication:Afib Last office visit:5/23 Weight:73 kg Age:88 Scr:1.0 CrCl:53.74 ml/min  Prescription refilled

## 2022-04-14 ENCOUNTER — Other Ambulatory Visit: Payer: Self-pay | Admitting: Internal Medicine

## 2022-04-14 DIAGNOSIS — K625 Hemorrhage of anus and rectum: Secondary | ICD-10-CM

## 2022-04-14 DIAGNOSIS — E1149 Type 2 diabetes mellitus with other diabetic neurological complication: Secondary | ICD-10-CM

## 2022-04-14 DIAGNOSIS — E782 Mixed hyperlipidemia: Secondary | ICD-10-CM

## 2022-04-14 DIAGNOSIS — R600 Localized edema: Secondary | ICD-10-CM

## 2022-04-15 DIAGNOSIS — K625 Hemorrhage of anus and rectum: Secondary | ICD-10-CM | POA: Diagnosis not present

## 2022-04-15 DIAGNOSIS — R600 Localized edema: Secondary | ICD-10-CM | POA: Diagnosis not present

## 2022-04-15 DIAGNOSIS — E782 Mixed hyperlipidemia: Secondary | ICD-10-CM | POA: Diagnosis not present

## 2022-04-15 DIAGNOSIS — E1149 Type 2 diabetes mellitus with other diabetic neurological complication: Secondary | ICD-10-CM | POA: Diagnosis not present

## 2022-04-16 LAB — CBC WITH DIFFERENTIAL/PLATELET
Absolute Monocytes: 660 cells/uL (ref 200–950)
Basophils Absolute: 27 cells/uL (ref 0–200)
Basophils Relative: 0.4 %
Eosinophils Absolute: 170 cells/uL (ref 15–500)
Eosinophils Relative: 2.5 %
HCT: 41.4 % (ref 38.5–50.0)
Hemoglobin: 14.1 g/dL (ref 13.2–17.1)
Lymphs Abs: 843 cells/uL — ABNORMAL LOW (ref 850–3900)
MCH: 31.7 pg (ref 27.0–33.0)
MCHC: 34.1 g/dL (ref 32.0–36.0)
MCV: 93 fL (ref 80.0–100.0)
MPV: 10.2 fL (ref 7.5–12.5)
Monocytes Relative: 9.7 %
Neutro Abs: 5100 cells/uL (ref 1500–7800)
Neutrophils Relative %: 75 %
Platelets: 185 10*3/uL (ref 140–400)
RBC: 4.45 10*6/uL (ref 4.20–5.80)
RDW: 13 % (ref 11.0–15.0)
Total Lymphocyte: 12.4 %
WBC: 6.8 10*3/uL (ref 3.8–10.8)

## 2022-04-16 LAB — LIPID PANEL
Cholesterol: 124 mg/dL (ref <200)
HDL: 44 mg/dL (ref >=40)
LDL Cholesterol (Calc): 61 mg/dL (calc)
Non-HDL Cholesterol (Calc): 80 mg/dL (calc) (ref <130)
Total CHOL/HDL Ratio: 2.8 (calc) (ref <5.0)
Triglycerides: 102 mg/dL (ref <150)

## 2022-04-16 LAB — COMPLETE METABOLIC PANEL WITH GFR
AG Ratio: 1.3 (calc) (ref 1.0–2.5)
ALT: 14 U/L (ref 9–46)
AST: 12 U/L (ref 10–35)
Albumin: 3.5 g/dL — ABNORMAL LOW (ref 3.6–5.1)
Alkaline phosphatase (APISO): 49 U/L (ref 35–144)
BUN: 22 mg/dL (ref 7–25)
CO2: 31 mmol/L (ref 20–32)
Calcium: 8.8 mg/dL (ref 8.6–10.3)
Chloride: 102 mmol/L (ref 98–110)
Creat: 1.14 mg/dL (ref 0.70–1.22)
Globulin: 2.6 g/dL (calc) (ref 1.9–3.7)
Glucose, Bld: 111 mg/dL — ABNORMAL HIGH (ref 65–99)
Potassium: 4.2 mmol/L (ref 3.5–5.3)
Sodium: 137 mmol/L (ref 135–146)
Total Bilirubin: 0.8 mg/dL (ref 0.2–1.2)
Total Protein: 6.1 g/dL (ref 6.1–8.1)
eGFR: 62 mL/min/{1.73_m2} (ref >=60)

## 2022-04-16 LAB — HEMOGLOBIN A1C
Hgb A1c MFr Bld: 6.5 % of total Hgb — ABNORMAL HIGH (ref <5.7)
Mean Plasma Glucose: 140 mg/dL
eAG (mmol/L): 7.7 mmol/L

## 2022-04-16 LAB — TSH: TSH: 2.09 mIU/L (ref 0.40–4.50)

## 2022-04-21 ENCOUNTER — Encounter: Payer: Self-pay | Admitting: Internal Medicine

## 2022-04-21 ENCOUNTER — Encounter: Payer: Medicare Other | Admitting: Internal Medicine

## 2022-04-27 ENCOUNTER — Encounter: Payer: Self-pay | Admitting: Internal Medicine

## 2022-04-28 ENCOUNTER — Encounter: Payer: Self-pay | Admitting: Internal Medicine

## 2022-04-28 ENCOUNTER — Non-Acute Institutional Stay: Payer: Medicare Other | Admitting: Internal Medicine

## 2022-04-28 VITALS — BP 127/61 | HR 102 | Temp 97.5°F | Ht 69.5 in | Wt 164.7 lb

## 2022-04-28 DIAGNOSIS — C61 Malignant neoplasm of prostate: Secondary | ICD-10-CM | POA: Diagnosis not present

## 2022-04-28 DIAGNOSIS — Z8673 Personal history of transient ischemic attack (TIA), and cerebral infarction without residual deficits: Secondary | ICD-10-CM

## 2022-04-28 DIAGNOSIS — K625 Hemorrhage of anus and rectum: Secondary | ICD-10-CM

## 2022-04-28 DIAGNOSIS — R600 Localized edema: Secondary | ICD-10-CM

## 2022-04-28 DIAGNOSIS — E1149 Type 2 diabetes mellitus with other diabetic neurological complication: Secondary | ICD-10-CM

## 2022-04-28 DIAGNOSIS — E782 Mixed hyperlipidemia: Secondary | ICD-10-CM

## 2022-04-28 DIAGNOSIS — I482 Chronic atrial fibrillation, unspecified: Secondary | ICD-10-CM

## 2022-04-28 NOTE — Patient Instructions (Signed)
You can reduce your Iron to 3 times a week.
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
## 2022-04-30 NOTE — Progress Notes (Signed)
A user error has taken place.

## 2022-05-11 ENCOUNTER — Other Ambulatory Visit: Payer: Self-pay | Admitting: Internal Medicine

## 2022-06-03 DIAGNOSIS — L570 Actinic keratosis: Secondary | ICD-10-CM | POA: Diagnosis not present

## 2022-06-03 DIAGNOSIS — L820 Inflamed seborrheic keratosis: Secondary | ICD-10-CM | POA: Diagnosis not present

## 2022-06-03 DIAGNOSIS — L821 Other seborrheic keratosis: Secondary | ICD-10-CM | POA: Diagnosis not present

## 2022-06-03 DIAGNOSIS — L814 Other melanin hyperpigmentation: Secondary | ICD-10-CM | POA: Diagnosis not present

## 2022-07-08 DIAGNOSIS — L820 Inflamed seborrheic keratosis: Secondary | ICD-10-CM | POA: Diagnosis not present

## 2022-07-08 DIAGNOSIS — L821 Other seborrheic keratosis: Secondary | ICD-10-CM | POA: Diagnosis not present

## 2022-07-08 DIAGNOSIS — L570 Actinic keratosis: Secondary | ICD-10-CM | POA: Diagnosis not present

## 2022-07-08 DIAGNOSIS — L814 Other melanin hyperpigmentation: Secondary | ICD-10-CM | POA: Diagnosis not present

## 2022-07-12 DIAGNOSIS — E119 Type 2 diabetes mellitus without complications: Secondary | ICD-10-CM | POA: Diagnosis not present

## 2022-07-12 DIAGNOSIS — H524 Presbyopia: Secondary | ICD-10-CM | POA: Diagnosis not present

## 2022-07-19 ENCOUNTER — Telehealth: Payer: Self-pay | Admitting: Cardiology

## 2022-07-19 NOTE — Telephone Encounter (Signed)
Pt is requesting call back to discuss getting orders for tests before his next appt with Dr. Agustin Cree. Pt states that at last appt, the doctor mention having some test done. He believes an echo and other tests, but not 100% sure. Requesting call back.

## 2022-07-20 ENCOUNTER — Telehealth: Payer: Self-pay

## 2022-07-20 DIAGNOSIS — R0609 Other forms of dyspnea: Secondary | ICD-10-CM

## 2022-07-20 NOTE — Telephone Encounter (Signed)
Ordered entered per Dr. Agustin Cree for ECHO 1 week prior to 09-21-22 visit

## 2022-07-20 NOTE — Telephone Encounter (Signed)
Order entered for pt to have ECHO 1 week prior to 09-21-22 appt.

## 2022-08-05 DIAGNOSIS — L820 Inflamed seborrheic keratosis: Secondary | ICD-10-CM | POA: Diagnosis not present

## 2022-08-05 DIAGNOSIS — L814 Other melanin hyperpigmentation: Secondary | ICD-10-CM | POA: Diagnosis not present

## 2022-08-05 DIAGNOSIS — L570 Actinic keratosis: Secondary | ICD-10-CM | POA: Diagnosis not present

## 2022-08-05 DIAGNOSIS — L821 Other seborrheic keratosis: Secondary | ICD-10-CM | POA: Diagnosis not present

## 2022-08-21 ENCOUNTER — Other Ambulatory Visit: Payer: Self-pay | Admitting: Cardiology

## 2022-08-28 ENCOUNTER — Other Ambulatory Visit: Payer: Self-pay | Admitting: Internal Medicine

## 2022-09-02 DIAGNOSIS — L821 Other seborrheic keratosis: Secondary | ICD-10-CM | POA: Diagnosis not present

## 2022-09-02 DIAGNOSIS — L570 Actinic keratosis: Secondary | ICD-10-CM | POA: Diagnosis not present

## 2022-09-02 DIAGNOSIS — L814 Other melanin hyperpigmentation: Secondary | ICD-10-CM | POA: Diagnosis not present

## 2022-09-14 ENCOUNTER — Other Ambulatory Visit: Payer: Medicare Other

## 2022-09-21 ENCOUNTER — Ambulatory Visit: Payer: Medicare Other | Admitting: Cardiology

## 2022-09-21 DIAGNOSIS — C61 Malignant neoplasm of prostate: Secondary | ICD-10-CM | POA: Diagnosis not present

## 2022-09-26 ENCOUNTER — Other Ambulatory Visit: Payer: Self-pay | Admitting: Cardiology

## 2022-09-27 NOTE — Telephone Encounter (Signed)
Prescription refill request for Xarelto received.  Indication:afib Last office visit:5/23 Weight:74.7 kg Age:88 Scr:1.1 CrCl:49.99  ml/min  Prescription refilled

## 2022-09-28 DIAGNOSIS — C61 Malignant neoplasm of prostate: Secondary | ICD-10-CM | POA: Diagnosis not present

## 2022-09-28 DIAGNOSIS — R351 Nocturia: Secondary | ICD-10-CM | POA: Diagnosis not present

## 2022-10-07 DIAGNOSIS — L859 Epidermal thickening, unspecified: Secondary | ICD-10-CM | POA: Diagnosis not present

## 2022-10-07 DIAGNOSIS — L570 Actinic keratosis: Secondary | ICD-10-CM | POA: Diagnosis not present

## 2022-10-07 DIAGNOSIS — L814 Other melanin hyperpigmentation: Secondary | ICD-10-CM | POA: Diagnosis not present

## 2022-10-07 DIAGNOSIS — L821 Other seborrheic keratosis: Secondary | ICD-10-CM | POA: Diagnosis not present

## 2022-10-15 ENCOUNTER — Ambulatory Visit (HOSPITAL_BASED_OUTPATIENT_CLINIC_OR_DEPARTMENT_OTHER)
Admission: RE | Admit: 2022-10-15 | Discharge: 2022-10-15 | Disposition: A | Discharge status: Home / Self Care | Payer: Medicare Other | Source: Ambulatory Visit | Attending: Cardiology | Admitting: Cardiology

## 2022-10-15 DIAGNOSIS — R0609 Other forms of dyspnea: Secondary | ICD-10-CM | POA: Diagnosis not present

## 2022-10-15 LAB — ECHOCARDIOGRAM COMPLETE
Area-P 1/2: 3.54 cm2
MV M vel: 5.17 m/s
MV Peak grad: 106.9 mmHg
P 1/2 time: 977 msec
S' Lateral: 2.8 cm

## 2022-10-27 ENCOUNTER — Encounter: Payer: Medicare Other | Admitting: Internal Medicine

## 2022-10-29 ENCOUNTER — Other Ambulatory Visit (HOSPITAL_BASED_OUTPATIENT_CLINIC_OR_DEPARTMENT_OTHER): Payer: Medicare Other

## 2022-11-03 ENCOUNTER — Ambulatory Visit: Payer: BC Managed Care – PPO | Attending: Cardiology | Admitting: Cardiology

## 2022-11-03 ENCOUNTER — Encounter: Payer: Self-pay | Admitting: Cardiology

## 2022-11-03 VITALS — BP 116/50 | HR 51 | Ht 69.5 in | Wt 169.0 lb

## 2022-11-03 DIAGNOSIS — K080 Exfoliation of teeth due to systemic causes: Secondary | ICD-10-CM | POA: Diagnosis not present

## 2022-11-03 DIAGNOSIS — I2510 Atherosclerotic heart disease of native coronary artery without angina pectoris: Secondary | ICD-10-CM

## 2022-11-03 DIAGNOSIS — Z7901 Long term (current) use of anticoagulants: Secondary | ICD-10-CM

## 2022-11-03 DIAGNOSIS — Z8673 Personal history of transient ischemic attack (TIA), and cerebral infarction without residual deficits: Secondary | ICD-10-CM | POA: Diagnosis not present
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
## 2022-11-03 NOTE — Patient Instructions (Signed)

## 2022-11-04 DIAGNOSIS — R600 Localized edema: Secondary | ICD-10-CM

## 2022-11-04 DIAGNOSIS — L814 Other melanin hyperpigmentation: Secondary | ICD-10-CM | POA: Diagnosis not present

## 2022-11-04 DIAGNOSIS — E782 Mixed hyperlipidemia: Secondary | ICD-10-CM | POA: Diagnosis not present

## 2022-11-04 DIAGNOSIS — E1149 Type 2 diabetes mellitus with other diabetic neurological complication: Secondary | ICD-10-CM

## 2022-11-04 DIAGNOSIS — K625 Hemorrhage of anus and rectum: Secondary | ICD-10-CM

## 2022-11-04 DIAGNOSIS — L570 Actinic keratosis: Secondary | ICD-10-CM | POA: Diagnosis not present

## 2022-11-04 DIAGNOSIS — L821 Other seborrheic keratosis: Secondary | ICD-10-CM | POA: Diagnosis not present

## 2022-11-04 LAB — CBC WITH DIFFERENTIAL/PLATELET
Absolute Monocytes: 540 cells/uL (ref 200–950)
Basophils Absolute: 42 cells/uL (ref 0–200)
Basophils Relative: 0.7 %
Eosinophils Absolute: 234 cells/uL (ref 15–500)
Eosinophils Relative: 3.9 %
HCT: 43.7 % (ref 38.5–50.0)
Hemoglobin: 14.8 g/dL (ref 13.2–17.1)
Lymphs Abs: 1284 cells/uL (ref 850–3900)
MCH: 32.2 pg (ref 27.0–33.0)
MCHC: 33.9 g/dL (ref 32.0–36.0)
MCV: 95 fL (ref 80.0–100.0)
MPV: 10.8 fL (ref 7.5–12.5)
Monocytes Relative: 9 %
Neutro Abs: 3900 cells/uL (ref 1500–7800)
Neutrophils Relative %: 65 %
Platelets: 197 10*3/uL (ref 140–400)
RBC: 4.6 10*6/uL (ref 4.20–5.80)
RDW: 12.8 % (ref 11.0–15.0)
Total Lymphocyte: 21.4 %
WBC: 6 10*3/uL (ref 3.8–10.8)

## 2022-11-04 LAB — COMPLETE METABOLIC PANEL WITH GFR
AG Ratio: 1.3 (calc) (ref 1.0–2.5)
ALT: 12 U/L (ref 9–46)
AST: 10 U/L (ref 10–35)
Albumin: 3.5 g/dL — ABNORMAL LOW (ref 3.6–5.1)
Alkaline phosphatase (APISO): 49 U/L (ref 35–144)
BUN: 21 mg/dL (ref 7–25)
CO2: 31 mmol/L (ref 20–32)
Calcium: 8.7 mg/dL (ref 8.6–10.3)
Chloride: 103 mmol/L (ref 98–110)
Creat: 1.07 mg/dL (ref 0.70–1.22)
Globulin: 2.6 g/dL (calc) (ref 1.9–3.7)
Glucose, Bld: 134 mg/dL — ABNORMAL HIGH (ref 65–99)
Potassium: 4.7 mmol/L (ref 3.5–5.3)
Sodium: 140 mmol/L (ref 135–146)
Total Bilirubin: 0.8 mg/dL (ref 0.2–1.2)
Total Protein: 6.1 g/dL (ref 6.1–8.1)
eGFR: 67 mL/min/{1.73_m2} (ref >=60)

## 2022-11-05 LAB — LIPID PANEL
Cholesterol: 127 mg/dL (ref <200)
HDL: 37 mg/dL — ABNORMAL LOW (ref >=40)
LDL Cholesterol (Calc): 67 mg/dL (calc)
Non-HDL Cholesterol (Calc): 90 mg/dL (calc) (ref <130)
Total CHOL/HDL Ratio: 3.4 (calc) (ref <5.0)
Triglycerides: 157 mg/dL — ABNORMAL HIGH (ref <150)

## 2022-11-05 LAB — TSH: TSH: 4.24 mIU/L (ref 0.40–4.50)

## 2022-11-05 LAB — HEMOGLOBIN A1C
Hgb A1c MFr Bld: 7.5 % of total Hgb — ABNORMAL HIGH (ref <5.7)
Mean Plasma Glucose: 169 mg/dL
eAG (mmol/L): 9.3 mmol/L

## 2022-11-06 ENCOUNTER — Other Ambulatory Visit: Payer: Self-pay | Admitting: Internal Medicine

## 2022-11-09 DIAGNOSIS — K080 Exfoliation of teeth due to systemic causes: Secondary | ICD-10-CM | POA: Diagnosis not present

## 2022-11-10 ENCOUNTER — Non-Acute Institutional Stay: Payer: Medicare Other | Admitting: Internal Medicine

## 2022-11-10 ENCOUNTER — Encounter: Payer: Self-pay | Admitting: Internal Medicine

## 2022-11-10 VITALS — BP 112/68 | HR 85 | Temp 97.8°F | Resp 16 | Ht 69.5 in | Wt 166.2 lb

## 2022-11-10 DIAGNOSIS — E1149 Type 2 diabetes mellitus with other diabetic neurological complication: Secondary | ICD-10-CM

## 2022-11-10 DIAGNOSIS — K625 Hemorrhage of anus and rectum: Secondary | ICD-10-CM

## 2022-11-10 DIAGNOSIS — R600 Localized edema: Secondary | ICD-10-CM

## 2022-11-10 DIAGNOSIS — C61 Malignant neoplasm of prostate: Secondary | ICD-10-CM | POA: Diagnosis not present

## 2022-11-10 DIAGNOSIS — E782 Mixed hyperlipidemia: Secondary | ICD-10-CM

## 2022-11-10 DIAGNOSIS — Z8673 Personal history of transient ischemic attack (TIA), and cerebral infarction without residual deficits: Secondary | ICD-10-CM

## 2022-11-10 DIAGNOSIS — I482 Chronic atrial fibrillation, unspecified: Secondary | ICD-10-CM

## 2022-11-10 MED ORDER — FUROSEMIDE 20 MG PO TABS: 20.0000 mg | ORAL_TABLET | Freq: Every day | ORAL | 3 refills | Status: DC
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
## 2022-12-02 DIAGNOSIS — L570 Actinic keratosis: Secondary | ICD-10-CM | POA: Diagnosis not present

## 2022-12-02 DIAGNOSIS — L814 Other melanin hyperpigmentation: Secondary | ICD-10-CM | POA: Diagnosis not present

## 2022-12-02 DIAGNOSIS — L821 Other seborrheic keratosis: Secondary | ICD-10-CM | POA: Diagnosis not present

## 2022-12-22 ENCOUNTER — Other Ambulatory Visit: Payer: Self-pay

## 2022-12-22 MED ORDER — POTASSIUM CHLORIDE CRYS ER 20 MEQ PO TBCR: 20.0000 meq | EXTENDED_RELEASE_TABLET | Freq: Every day | ORAL | 3 refills | Status: DC

## 2022-12-28 ENCOUNTER — Other Ambulatory Visit: Payer: Self-pay | Admitting: Cardiology

## 2022-12-28 DIAGNOSIS — I482 Chronic atrial fibrillation, unspecified: Secondary | ICD-10-CM

## 2022-12-28 DIAGNOSIS — E782 Mixed hyperlipidemia: Secondary | ICD-10-CM

## 2022-12-28 DIAGNOSIS — I2510 Atherosclerotic heart disease of native coronary artery without angina pectoris: Secondary | ICD-10-CM

## 2022-12-28 DIAGNOSIS — E1149 Type 2 diabetes mellitus with other diabetic neurological complication: Secondary | ICD-10-CM

## 2023-01-04 ENCOUNTER — Other Ambulatory Visit: Payer: Self-pay

## 2023-01-04 DIAGNOSIS — E1149 Type 2 diabetes mellitus with other diabetic neurological complication: Secondary | ICD-10-CM

## 2023-01-04 DIAGNOSIS — K625 Hemorrhage of anus and rectum: Secondary | ICD-10-CM

## 2023-01-27 ENCOUNTER — Encounter: Payer: Self-pay | Admitting: Nurse Practitioner

## 2023-01-27 ENCOUNTER — Ambulatory Visit (INDEPENDENT_AMBULATORY_CARE_PROVIDER_SITE_OTHER): Payer: Medicare Other | Admitting: Nurse Practitioner

## 2023-01-27 ENCOUNTER — Telehealth: Payer: Self-pay

## 2023-01-27 DIAGNOSIS — Z0000 Encounter for general adult medical examination without abnormal findings: Secondary | ICD-10-CM | POA: Diagnosis not present

## 2023-01-27 NOTE — Patient Instructions (Signed)
[name] , Thank you for taking time to come for your Medicare Wellness Visit. I appreciate your ongoing commitment to your health goals. Please review the following plan we discussed and let me know if I can assist you in the future.   Screening recommendations/referrals: Colonoscopy aged out Recommended yearly ophthalmology/optometry visit for glaucoma screening and checkup Recommended yearly dental visit for hygiene and checkup  Vaccinations: Influenza vaccine due annually in September/October Pneumococcal vaccine up to date Tdap vaccine up to date Shingles vaccine up to date    Advanced directives: please bring to your next appt so we can put on file.   Conditions/risks identified: advanced age  Next appointment: yearly   Preventive Care 87 Years and Older, Male Preventive care refers to lifestyle choices and visits with your health care provider that can promote health and wellness. What does preventive care include? A yearly physical exam. This is also called an annual well check. Dental exams once or twice a year. Routine eye exams. Ask your health care provider how often you should have your eyes checked. Personal lifestyle choices, including: Daily care of your teeth and gums. Regular physical activity. Eating a healthy diet. Avoiding tobacco and drug use. Limiting alcohol use. Practicing safe sex. Taking low doses of aspirin every day. Taking vitamin and mineral supplements as recommended by your health care provider. What happens during an annual well check? The services and screenings done by your health care provider during your annual well check will depend on your age, overall health, lifestyle risk factors, and family history of disease. Counseling  Your health care provider may ask you questions about your: Alcohol use. Tobacco use. Drug use. Emotional well-being. Home and relationship well-being. Sexual activity. Eating habits. History of  falls. Memory and ability to understand (cognition). Work and work Statistician. Screening  You may have the following tests or measurements: Height, weight, and BMI. Blood pressure. Lipid and cholesterol levels. These may be checked every 5 years, or more frequently if you are over 29 years old. Skin check. Lung cancer screening. You may have this screening every year starting at age 69 if you have a 30-pack-year history of smoking and currently smoke or have quit within the past 15 years. Fecal occult blood test (FOBT) of the stool. You may have this test every year starting at age 35. Flexible sigmoidoscopy or colonoscopy. You may have a sigmoidoscopy every 5 years or a colonoscopy every 10 years starting at age 17. Prostate cancer screening. Recommendations will vary depending on your family history and other risks. Hepatitis C blood test. Hepatitis B blood test. Sexually transmitted disease (STD) testing. Diabetes screening. This is done by checking your blood sugar (glucose) after you have not eaten for a while (fasting). You may have this done every 1-3 years. Abdominal aortic aneurysm (AAA) screening. You may need this if you are a current or former smoker. Osteoporosis. You may be screened starting at age 69 if you are at high risk. Talk with your health care provider about your test results, treatment options, and if necessary, the need for more tests. Vaccines  Your health care provider may recommend certain vaccines, such as: Influenza vaccine. This is recommended every year. Tetanus, diphtheria, and acellular pertussis (Tdap, Td) vaccine. You may need a Td booster every 10 years. Zoster vaccine. You may need this after age 75. Pneumococcal 13-valent conjugate (PCV13) vaccine. One dose is recommended after age 43. Pneumococcal polysaccharide (PPSV23) vaccine. One dose is recommended after age 39.  Talk to your health care provider about which screenings and vaccines you need and  how often you need them. This information is not intended to replace advice given to you by your health care provider. Make sure you discuss any questions you have with your health care provider. Document Released: 11/14/2015 Document Revised: 07/07/2016 Document Reviewed: 08/19/2015 Elsevier Interactive Patient Education  2017 [place] Prevention in the Home Falls can cause injuries. They can happen to people of all ages. There are many things you can do to make your home safe and to help prevent falls. What can I do on the outside of my home? Regularly fix the edges of walkways and driveways and fix any cracks. Remove anything that might make you trip as you walk through a door, such as a raised step or threshold. Trim any bushes or trees on the path to your home. Use bright outdoor lighting. Clear any walking paths of anything that might make someone trip, such as rocks or tools. Regularly check to see if handrails are loose or broken. Make sure that both sides of any steps have handrails. Any raised decks and porches should have guardrails on the edges. Have any leaves, snow, or ice cleared regularly. Use sand or salt on walking paths during winter. Clean up any spills in your garage right away. This includes oil or grease spills. What can I do in the bathroom? Use night lights. Install grab bars by the toilet and in the tub and shower. Do not use towel bars as grab bars. Use non-skid mats or decals in the tub or shower. If you need to sit down in the shower, use a plastic, non-slip stool. Keep the floor dry. Clean up any water that spills on the floor as soon as it happens. Remove soap buildup in the tub or shower regularly. Attach bath mats securely with double-sided non-slip rug tape. Do not have throw rugs and other things on the floor that can make you trip. What can I do in the bedroom? Use night lights. Make sure that you have a light by your bed that is easy to  reach. Do not use any sheets or blankets that are too big for your bed. They should not hang down onto the floor. Have a firm chair that has side arms. You can use this for support while you get dressed. Do not have throw rugs and other things on the floor that can make you trip. What can I do in the kitchen? Clean up any spills right away. Avoid walking on wet floors. Keep items that you use a lot in easy-to-reach places. If you need to reach something above you, use a strong step stool that has a grab bar. Keep electrical cords out of the way. Do not use floor polish or wax that makes floors slippery. If you must use wax, use non-skid floor wax. Do not have throw rugs and other things on the floor that can make you trip. What can I do with my stairs? Do not leave any items on the stairs. Make sure that there are handrails on both sides of the stairs and use them. Fix handrails that are broken or loose. Make sure that handrails are as long as the stairways. Check any carpeting to make sure that it is firmly attached to the stairs. Fix any carpet that is loose or worn. Avoid having throw rugs at the top or bottom of the stairs. If you do have throw  rugs, attach them to the floor with carpet tape. Make sure that you have a light switch at the top of the stairs and the bottom of the stairs. If you do not have them, ask someone to add them for you. What else can I do to help prevent falls? Wear shoes that: Do not have high heels. Have rubber bottoms. Are comfortable and fit you well. Are closed at the toe. Do not wear sandals. If you use a stepladder: Make sure that it is fully opened. Do not climb a closed stepladder. Make sure that both sides of the stepladder are locked into place. Ask someone to hold it for you, if possible. Clearly mark and make sure that you can see: Any grab bars or handrails. First and last steps. Where the edge of each step is. Use tools that help you move  around (mobility aids) if they are needed. These include: Canes. Walkers. Scooters. Crutches. Turn on the lights when you go into a dark area. Replace any light bulbs as soon as they burn out. Set up your furniture so you have a clear path. Avoid moving your furniture around. If any of your floors are uneven, fix them. If there are any pets around you, be aware of where they are. Review your medicines with your doctor. Some medicines can make you feel dizzy. This can increase your chance of falling. Ask your doctor what other things that you can do to help prevent falls. This information is not intended to replace advice given to you by your health care provider. Make sure you discuss any questions you have with your health care provider. Document Released: 08/14/2009 Document Revised: 03/25/2016 Document Reviewed: 11/22/2014 Elsevier Interactive Patient Education  2017 [employer].

## 2023-01-27 NOTE — Telephone Encounter (Signed)
Ryan Cochran, Ryan Cochran are scheduled for a virtual visit with your provider today.    Just as we do with appointments in the office, we must obtain your consent to participate.  Your consent will be active for this visit and any virtual visit you may have with one of our providers in the next 365 days.    If you have a MyChart account, I can also send a copy of this consent to you electronically.  All virtual visits are billed to your insurance company just like a traditional visit in the office.  As this is a virtual visit, video technology does not allow for your provider to perform a traditional examination.  This may limit your provider's ability to fully assess your condition.  If your provider identifies any concerns that need to be evaluated in person or the need to arrange testing such as labs, EKG, etc, we will make arrangements to do so.    Although advances in technology are sophisticated, we cannot ensure that it will always work on either your end or our end.  If the connection with a video visit is poor, we may have to switch to a telephone visit.  With either a video or telephone visit, we are not always able to ensure that we have a secure connection.   I need to obtain your verbal consent now.   Are you willing to proceed with your visit today?   Ryan Stall. has provided verbal consent on 01/27/2023 for a virtual visit (video or telephone).   Ryan Cochran, [place] 01/27/2023  10:15 AM
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
## 2023-01-27 NOTE — Progress Notes (Signed)
   This service is provided via telemedicine  No vital signs collected/recorded due to the encounter was a telemedicine visit.   Location of patient (ex: home, work):  Home  Patient consents to a telephone visit: Yes, see telephone visit dated 01/27/23  Location of the provider (ex: office, home): [place], [place]    Name of any referring provider:  N/A  Names of all persons participating in the telemedicine service and their role in the encounter:  S.Chrae B/CMA, Sherrie Mustache, NP, and Patient   Time spent on call:  9 min with medical assistant

## 2023-02-20 ENCOUNTER — Other Ambulatory Visit: Payer: Self-pay | Admitting: Cardiology

## 2023-02-21 NOTE — Telephone Encounter (Signed)
Refills to pharmacy 

## 2023-03-17 ENCOUNTER — Other Ambulatory Visit: Payer: Medicare Other

## 2023-03-17 DIAGNOSIS — K625 Hemorrhage of anus and rectum: Secondary | ICD-10-CM | POA: Diagnosis not present

## 2023-03-17 DIAGNOSIS — E1149 Type 2 diabetes mellitus with other diabetic neurological complication: Secondary | ICD-10-CM | POA: Diagnosis not present

## 2023-03-18 LAB — CBC WITH DIFFERENTIAL/PLATELET
Absolute Monocytes: 567 cells/uL (ref 200–950)
Basophils Absolute: 52 cells/uL (ref 0–200)
Basophils Relative: 1 %
Eosinophils Absolute: 182 cells/uL (ref 15–500)
Eosinophils Relative: 3.5 %
HCT: 42.8 % (ref 38.5–50.0)
Hemoglobin: 14.4 g/dL (ref 13.2–17.1)
Lymphs Abs: 1238 cells/uL (ref 850–3900)
MCH: 31.9 pg (ref 27.0–33.0)
MCHC: 33.6 g/dL (ref 32.0–36.0)
MCV: 94.7 fL (ref 80.0–100.0)
MPV: 10.4 fL (ref 7.5–12.5)
Monocytes Relative: 10.9 %
Neutro Abs: 3162 cells/uL (ref 1500–7800)
Neutrophils Relative %: 60.8 %
Platelets: 166 10*3/uL (ref 140–400)
RBC: 4.52 10*6/uL (ref 4.20–5.80)
RDW: 12.6 % (ref 11.0–15.0)
Total Lymphocyte: 23.8 %
WBC: 5.2 10*3/uL (ref 3.8–10.8)

## 2023-03-18 LAB — HEMOGLOBIN A1C
Hgb A1c MFr Bld: 7 % of total Hgb — ABNORMAL HIGH (ref <5.7)
Mean Plasma Glucose: 154 mg/dL
eAG (mmol/L): 8.5 mmol/L

## 2023-03-22 DIAGNOSIS — C61 Malignant neoplasm of prostate: Secondary | ICD-10-CM | POA: Diagnosis not present

## 2023-03-29 DIAGNOSIS — R351 Nocturia: Secondary | ICD-10-CM | POA: Diagnosis not present

## 2023-03-29 DIAGNOSIS — C61 Malignant neoplasm of prostate: Secondary | ICD-10-CM | POA: Diagnosis not present

## 2023-03-30 DIAGNOSIS — K080 Exfoliation of teeth due to systemic causes: Secondary | ICD-10-CM | POA: Diagnosis not present

## 2023-04-01 ENCOUNTER — Other Ambulatory Visit: Payer: Self-pay | Admitting: Cardiology

## 2023-04-01 NOTE — Telephone Encounter (Signed)
Prescription refill request for Xarelto received.  Indication:afib Last office visit:1/24 Weight:75.4  kg Age:89 Scr:1.07 CrCl:50.89  ml/min  Prescription refilled

## 2023-04-13 ENCOUNTER — Encounter: Payer: Self-pay | Admitting: Internal Medicine

## 2023-04-13 ENCOUNTER — Non-Acute Institutional Stay: Payer: Medicare Other | Admitting: Internal Medicine

## 2023-04-13 VITALS — BP 124/80 | HR 60 | Temp 96.4°F | Resp 16 | Ht 69.5 in | Wt 165.2 lb

## 2023-04-13 DIAGNOSIS — C61 Malignant neoplasm of prostate: Secondary | ICD-10-CM

## 2023-04-13 DIAGNOSIS — Z8673 Personal history of transient ischemic attack (TIA), and cerebral infarction without residual deficits: Secondary | ICD-10-CM

## 2023-04-13 DIAGNOSIS — E1149 Type 2 diabetes mellitus with other diabetic neurological complication: Secondary | ICD-10-CM

## 2023-04-13 DIAGNOSIS — I482 Chronic atrial fibrillation, unspecified: Secondary | ICD-10-CM

## 2023-04-13 DIAGNOSIS — M25362 Other instability, left knee: Secondary | ICD-10-CM | POA: Diagnosis not present

## 2023-04-13 DIAGNOSIS — K625 Hemorrhage of anus and rectum: Secondary | ICD-10-CM | POA: Diagnosis not present

## 2023-04-13 DIAGNOSIS — R600 Localized edema: Secondary | ICD-10-CM

## 2023-04-13 DIAGNOSIS — E782 Mixed hyperlipidemia: Secondary | ICD-10-CM
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
## 2023-04-21 ENCOUNTER — Ambulatory Visit (INDEPENDENT_AMBULATORY_CARE_PROVIDER_SITE_OTHER): Payer: Medicare Other | Admitting: Orthopaedic Surgery

## 2023-04-21 ENCOUNTER — Other Ambulatory Visit (INDEPENDENT_AMBULATORY_CARE_PROVIDER_SITE_OTHER): Payer: Medicare Other

## 2023-04-21 DIAGNOSIS — M25562 Pain in left knee: Secondary | ICD-10-CM | POA: Diagnosis not present

## 2023-04-21 DIAGNOSIS — G8929 Other chronic pain: Secondary | ICD-10-CM | POA: Diagnosis not present

## 2023-06-15 ENCOUNTER — Encounter: Payer: Self-pay | Admitting: Cardiology

## 2023-06-17 ENCOUNTER — Encounter: Payer: Self-pay | Admitting: Cardiology

## 2023-06-17 ENCOUNTER — Ambulatory Visit: Payer: Medicare Other | Attending: Cardiology | Admitting: Cardiology

## 2023-06-17 VITALS — BP 112/50 | HR 60 | Ht 69.5 in | Wt 165.0 lb

## 2023-06-17 DIAGNOSIS — I482 Chronic atrial fibrillation, unspecified: Secondary | ICD-10-CM | POA: Diagnosis not present

## 2023-06-17 DIAGNOSIS — I2510 Atherosclerotic heart disease of native coronary artery without angina pectoris: Secondary | ICD-10-CM | POA: Diagnosis not present

## 2023-06-17 DIAGNOSIS — E782 Mixed hyperlipidemia: Secondary | ICD-10-CM | POA: Diagnosis not present

## 2023-06-17 DIAGNOSIS — I10 Essential (primary) hypertension: Secondary | ICD-10-CM

## 2023-06-17 NOTE — Patient Instructions (Signed)
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
## 2023-06-21 DIAGNOSIS — E119 Type 2 diabetes mellitus without complications: Secondary | ICD-10-CM | POA: Diagnosis not present

## 2023-06-21 DIAGNOSIS — H524 Presbyopia: Secondary | ICD-10-CM | POA: Diagnosis not present

## 2023-07-09 ENCOUNTER — Other Ambulatory Visit: Payer: Self-pay | Admitting: Cardiology

## 2023-07-09 DIAGNOSIS — I482 Chronic atrial fibrillation, unspecified: Secondary | ICD-10-CM

## 2023-07-11 NOTE — Telephone Encounter (Signed)
Prescription refill request for Xarelto received.  Indication: Afib Last office visit: 06/17/23 Bing Matter)  Weight: 74.8kg Age: 89 Scr: 1.07 (11/04/22)  CrCl: 50.48ml/min  Appropriate dose. Refill sent.

## 2023-08-02 DIAGNOSIS — E119 Type 2 diabetes mellitus without complications: Secondary | ICD-10-CM | POA: Diagnosis not present

## 2023-08-04 ENCOUNTER — Other Ambulatory Visit: Payer: Self-pay

## 2023-08-04 ENCOUNTER — Encounter (HOSPITAL_BASED_OUTPATIENT_CLINIC_OR_DEPARTMENT_OTHER): Payer: Self-pay | Admitting: Emergency Medicine

## 2023-08-04 ENCOUNTER — Emergency Department (HOSPITAL_BASED_OUTPATIENT_CLINIC_OR_DEPARTMENT_OTHER)
Admission: EM | Admit: 2023-08-04 | Discharge: 2023-08-05 | Disposition: A | Discharge status: Home / Self Care | Payer: Medicare Other | Attending: Emergency Medicine | Admitting: Emergency Medicine

## 2023-08-04 DIAGNOSIS — K222 Esophageal obstruction: Secondary | ICD-10-CM | POA: Insufficient documentation

## 2023-08-04 DIAGNOSIS — Z7901 Long term (current) use of anticoagulants: Secondary | ICD-10-CM | POA: Diagnosis not present

## 2023-08-04 DIAGNOSIS — T18128A Food in esophagus causing other injury, initial encounter: Secondary | ICD-10-CM | POA: Diagnosis not present

## 2023-08-04 DIAGNOSIS — R1310 Dysphagia, unspecified: Secondary | ICD-10-CM | POA: Diagnosis not present

## 2023-08-04 MED ORDER — GLUCAGON HCL RDNA (DIAGNOSTIC) 1 MG IJ SOLR
1.0000 mg | Freq: Once | INTRAMUSCULAR | Status: AC
  Administered 2023-08-04: 1 mg via INTRAVENOUS
  Filled 2023-08-04: qty 1

## 2023-08-04 NOTE — ED Provider Notes (Signed)
Ryan Cochran EMERGENCY DEPARTMENT AT [facility]  Provider Note  CSN: [id number] Arrival date & time: 08/04/23 2149  History Chief Complaint  Patient presents with   Dysphagia   Abdominal Pain    Ryan Cochran. is a 89 y.o. male here with his daughter for dysphagia. Reports unable to swallow liquids or saliva since he at dinner this evening. Feels like something is stuck. Has not had this happen before. Some epigastric discomfort. He is on Xarelto for afib.    Home Medications Prior to Admission medications   Medication Sig Start Date End Date Taking? Authorizing Provider  acetaminophen (TYLENOL) 500 MG tablet Take 500 mg by mouth every 6 (six) hours as needed for moderate pain or mild pain.    [provider]  atorvastatin (LIPITOR) 10 MG tablet Take 1 tablet (10 mg total) by mouth daily. 12/28/22   Georgeanna Lea, MD  digoxin (LANOXIN) 0.125 MG tablet TAKE 1 TABLET(0.125 MG) BY MOUTH DAILY Patient taking differently: Take 0.125 mg by mouth daily. 02/21/23   Georgeanna Lea, MD  finasteride (PROSCAR) 5 MG tablet Take 5 mg by mouth at bedtime.    [provider]  furosemide (LASIX) 20 MG tablet Take 1 tablet (20 mg total) by mouth daily. 11/10/22   Mahlon Gammon, MD  potassium chloride SA (KLOR-CON M) 20 MEQ tablet Take 1 tablet (20 mEq total) by mouth daily. 12/22/22   Mahlon Gammon, MD  tamsulosin (FLOMAX) 0.4 MG CAPS capsule Take 0.4 mg by mouth daily.    [provider]  tolterodine (DETROL LA) 4 MG 24 hr capsule Take 4 mg by mouth daily.    [provider]  XARELTO 20 MG TABS tablet TAKE 1 TABLET(20 MG) BY MOUTH DAILY 07/11/23   Georgeanna Lea, MD     Allergies    Lactose intolerance (gi)   Review of Systems   Review of Systems Please see HPI for pertinent positives and negatives  Physical Exam BP (!) 159/69   Pulse 69   Temp 98.3 F (36.8 C)   Resp 20   Ht 5' 9.5" (1.765 m)   Wt 72.6 kg   SpO2 99%   BMI  23.29 kg/m   Physical Exam Vitals and nursing note reviewed.  HENT:     Head: Normocephalic.     Nose: Nose normal.  Eyes:     Extraocular Movements: Extraocular movements intact.  Pulmonary:     Effort: Pulmonary effort is normal.  Abdominal:     Tenderness: There is no abdominal tenderness.  Musculoskeletal:        General: Normal range of motion.     Cervical back: Neck supple.  Skin:    Findings: No rash (on exposed skin).  Neurological:     Mental Status: He is alert and oriented to person, place, and time.  Psychiatric:        Mood and Affect: Mood normal.     ED Results / Procedures / Treatments   EKG None  Procedures Procedures  Medications Ordered in the ED Medications  glucagon (human recombinant) (GLUCAGEN) injection 1 mg (1 mg Intravenous Given 08/04/23 2345)    Initial Impression and Plan  Patient with history and exam consistent with esophageal food impaction, unable to tolerate liquids by mouth. Will attempt a dose of glucagon, if not successful will likely need transfer for endoscopy in AM.   ED Course   Clinical Course as of 08/05/23 0009  Fri  Aug 05, 2023  0006 After glucagon administration, patient's symptoms have resolved. He is able to drink water without any difficulty and is comfortable going home. Recommend he eat soft foods only for the next few days and be sure to chew his food thoroughly moving forward. Recommend he follow up with GI as an outpatient to consider EGD. RTED for any other concerns.  [CS]    Clinical Course User Index [CS] Pollyann Savoy, MD     MDM Rules/Calculators/A&P Medical Decision Making Problems Addressed: Esophageal obstruction due to food impaction: acute illness or injury  Risk Prescription drug management. Decision regarding hospitalization.     Final Clinical Impression(s) / ED Diagnoses Final diagnoses:  Esophageal obstruction due to food impaction    Rx / DC Orders ED Discharge Orders      None        Pollyann Savoy, MD 08/05/23 0009

## 2023-08-04 NOTE — ED Notes (Signed)
Pt. Bringing up white, frothy sputum

## 2023-08-04 NOTE — ED Triage Notes (Signed)
  Patient comes in with dysphagia and epigastric pain after eating dinner earlier this evening.  Patient states he was having difficulty swallowing after eating about half his meal, states it felt like it was not going all the way down.  Attempted to drink some fluids and had episode of emesis that was undigested food.  Has hx of abdominal surgeries for perforated bowel, and hernia repair.  Pain 3/10, pressure.

## 2023-08-19 ENCOUNTER — Other Ambulatory Visit: Payer: Self-pay | Admitting: Cardiology

## 2023-10-13 ENCOUNTER — Other Ambulatory Visit: Payer: Medicare Other

## 2023-10-13 DIAGNOSIS — E782 Mixed hyperlipidemia: Secondary | ICD-10-CM | POA: Diagnosis not present

## 2023-10-13 DIAGNOSIS — E1149 Type 2 diabetes mellitus with other diabetic neurological complication: Secondary | ICD-10-CM | POA: Diagnosis not present

## 2023-10-13 DIAGNOSIS — R600 Localized edema: Secondary | ICD-10-CM | POA: Diagnosis not present

## 2023-10-14 LAB — LIPID PANEL
Cholesterol: 125 mg/dL (ref <200)
HDL: 38 mg/dL — ABNORMAL LOW (ref >=40)
LDL Cholesterol (Calc): 70 mg/dL
Non-HDL Cholesterol (Calc): 87 mg/dL (ref <130)
Total CHOL/HDL Ratio: 3.3 (calc) (ref <5.0)
Triglycerides: 86 mg/dL (ref <150)

## 2023-10-14 LAB — COMPLETE METABOLIC PANEL WITH GFR
AG Ratio: 1.7 (calc) (ref 1.0–2.5)
ALT: 12 U/L (ref 9–46)
AST: 10 U/L (ref 10–35)
Albumin: 3.7 g/dL (ref 3.6–5.1)
Alkaline phosphatase (APISO): 49 U/L (ref 35–144)
BUN: 23 mg/dL (ref 7–25)
CO2: 31 mmol/L (ref 20–32)
Calcium: 8.6 mg/dL (ref 8.6–10.3)
Chloride: 104 mmol/L (ref 98–110)
Creat: 1.21 mg/dL (ref 0.70–1.22)
Globulin: 2.2 g/dL (ref 1.9–3.7)
Glucose, Bld: 126 mg/dL — ABNORMAL HIGH (ref 65–99)
Potassium: 4.4 mmol/L (ref 3.5–5.3)
Sodium: 139 mmol/L (ref 135–146)
Total Bilirubin: 0.6 mg/dL (ref 0.2–1.2)
Total Protein: 5.9 g/dL — ABNORMAL LOW (ref 6.1–8.1)
eGFR: 58 mL/min/{1.73_m2} — ABNORMAL LOW (ref >=60)

## 2023-10-14 LAB — CBC WITH DIFFERENTIAL/PLATELET
Absolute Lymphocytes: 1250 {cells}/uL (ref 850–3900)
Absolute Monocytes: 500 {cells}/uL (ref 200–950)
Basophils Absolute: 29 {cells}/uL (ref 0–200)
Basophils Relative: 0.6 %
Eosinophils Absolute: 279 {cells}/uL (ref 15–500)
Eosinophils Relative: 5.7 %
HCT: 44.3 % (ref 38.5–50.0)
Hemoglobin: 14.9 g/dL (ref 13.2–17.1)
MCH: 32.7 pg (ref 27.0–33.0)
MCHC: 33.6 g/dL (ref 32.0–36.0)
MCV: 97.1 fL (ref 80.0–100.0)
MPV: 11.1 fL (ref 7.5–12.5)
Monocytes Relative: 10.2 %
Neutro Abs: 2842 {cells}/uL (ref 1500–7800)
Neutrophils Relative %: 58 %
Platelets: 176 10*3/uL (ref 140–400)
RBC: 4.56 10*6/uL (ref 4.20–5.80)
RDW: 12.7 % (ref 11.0–15.0)
Total Lymphocyte: 25.5 %
WBC: 4.9 10*3/uL (ref 3.8–10.8)

## 2023-10-14 LAB — HEMOGLOBIN A1C
Hgb A1c MFr Bld: 7.2 %{Hb} — ABNORMAL HIGH (ref <5.7)
Mean Plasma Glucose: 160 mg/dL
eAG (mmol/L): 8.9 mmol/L

## 2023-10-14 LAB — TSH: TSH: 4.02 m[IU]/L (ref 0.40–4.50)

## 2023-10-18 DIAGNOSIS — K080 Exfoliation of teeth due to systemic causes: Secondary | ICD-10-CM | POA: Diagnosis not present

## 2023-10-19 ENCOUNTER — Non-Acute Institutional Stay: Payer: Medicare Other | Admitting: Internal Medicine

## 2023-10-19 ENCOUNTER — Encounter: Payer: Self-pay | Admitting: Internal Medicine

## 2023-10-19 VITALS — BP 126/67 | HR 75 | Temp 97.7°F | Resp 16 | Ht 69.5 in | Wt 166.8 lb

## 2023-10-19 DIAGNOSIS — T18128A Food in esophagus causing other injury, initial encounter: Secondary | ICD-10-CM | POA: Diagnosis not present

## 2023-10-19 DIAGNOSIS — E782 Mixed hyperlipidemia: Secondary | ICD-10-CM

## 2023-10-19 DIAGNOSIS — E1149 Type 2 diabetes mellitus with other diabetic neurological complication: Secondary | ICD-10-CM | POA: Diagnosis not present

## 2023-10-19 DIAGNOSIS — C61 Malignant neoplasm of prostate: Secondary | ICD-10-CM

## 2023-10-19 DIAGNOSIS — R600 Localized edema: Secondary | ICD-10-CM

## 2023-10-19 DIAGNOSIS — W44F3XA Food entering into or through a natural orifice, initial encounter: Secondary | ICD-10-CM

## 2023-10-19 DIAGNOSIS — I482 Chronic atrial fibrillation, unspecified: Secondary | ICD-10-CM

## 2023-10-19 DIAGNOSIS — M25362 Other instability, left knee: Secondary | ICD-10-CM | POA: Diagnosis not present

## 2023-10-19 DIAGNOSIS — Z8673 Personal history of transient ischemic attack (TIA), and cerebral infarction without residual deficits: Secondary | ICD-10-CM

## 2023-10-19 MED ORDER — METFORMIN HCL 500 MG PO TABS
500.0000 mg | ORAL_TABLET | Freq: Every day | ORAL | 3 refills | Status: DC
Start: 2023-10-19 — End: 2024-09-17
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
## 2023-11-08 ENCOUNTER — Encounter: Payer: Self-pay | Admitting: Nurse Practitioner

## 2023-11-08 ENCOUNTER — Ambulatory Visit: Payer: Medicare Other | Admitting: Nurse Practitioner

## 2023-11-08 VITALS — BP 116/50 | HR 80 | Ht 67.0 in | Wt 165.4 lb

## 2023-11-08 DIAGNOSIS — Z860101 Personal history of adenomatous and serrated colon polyps: Secondary | ICD-10-CM

## 2023-11-08 DIAGNOSIS — R1319 Other dysphagia: Secondary | ICD-10-CM | POA: Diagnosis not present

## 2023-11-08 NOTE — Patient Instructions (Signed)
 You have been scheduled for a Barium Esophogram at [facility] (1st floor of the hospital) on 11/15/2023 at 11 am. Please arrive 30 minutes prior to your appointment for registration. Make certain not to have anything to eat or drink 3 hours prior to your test. If you need to reschedule for any reason, please contact radiology at [phone] to do so. __________________________________________________________________ A barium swallow is an examination that concentrates on views of the esophagus. This tends to be a double contrast exam (barium and two liquids which, when combined, create a gas to distend the wall of the oesophagus) or single contrast (non-ionic iodine based). The study is usually tailored to your symptoms so a good history is essential. Attention is paid during the study to the form, structure and configuration of the esophagus, looking for functional disorders (such as aspiration, dysphagia, achalasia, motility and reflux) EXAMINATION You may be asked to change into a gown, depending on the type of swallow being performed. A radiologist and radiographer will perform the procedure. The radiologist will advise you of the type of contrast selected for your procedure and direct you during the exam. You will be asked to stand, sit or lie in several different positions and to hold a small amount of fluid in your mouth before being asked to swallow while the imaging is performed .In some instances you may be asked to swallow barium coated marshmallows to assess the motility of a solid food bolus. The exam can be recorded as a digital or video fluoroscopy procedure. POST PROCEDURE It will take 1-2 days for the barium to pass through your system. To facilitate this, it is important, unless otherwise directed, to increase your fluids for the next 24-48hrs and to resume your normal diet.  This test typically takes about 30 minutes to  perform. __________________________________________________________________________________  Due to recent changes in healthcare laws, you may see the results of your imaging and laboratory studies on MyChart before your provider has had a chance to review them.  We understand that in some cases there may be results that are confusing or concerning to you. Not all laboratory results come back in the same time frame and the provider may be waiting for multiple results in order to interpret others.  Please give us  48 hours in order for your provider to thoroughly review all the results before contacting the office for clarification of your results.    I appreciate the  opportunity to care for you  Thank You   Vina Kerman PIETY
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
## 2023-11-08 NOTE — Progress Notes (Signed)
 Addendum: Reviewed and agree with assessment and management plan. Asha Grumbine, Carie Caddy, MD

## 2023-11-15 ENCOUNTER — Other Ambulatory Visit: Payer: Self-pay | Admitting: Nurse Practitioner

## 2023-11-15 ENCOUNTER — Ambulatory Visit (HOSPITAL_COMMUNITY)
Admission: RE | Admit: 2023-11-15 | Discharge: 2023-11-15 | Disposition: A | Discharge status: Home / Self Care | Payer: Medicare Other | Source: Ambulatory Visit | Attending: Nurse Practitioner | Admitting: Nurse Practitioner

## 2023-11-15 DIAGNOSIS — R1319 Other dysphagia: Secondary | ICD-10-CM | POA: Insufficient documentation

## 2023-11-15 DIAGNOSIS — R1310 Dysphagia, unspecified: Secondary | ICD-10-CM | POA: Diagnosis not present

## 2023-12-09 ENCOUNTER — Other Ambulatory Visit: Payer: Self-pay | Admitting: Internal Medicine

## 2024-01-13 ENCOUNTER — Other Ambulatory Visit: Payer: Self-pay | Admitting: Internal Medicine

## 2024-01-23 DIAGNOSIS — H0015 Chalazion left lower eyelid: Secondary | ICD-10-CM | POA: Diagnosis not present

## 2024-01-30 ENCOUNTER — Ambulatory Visit (INDEPENDENT_AMBULATORY_CARE_PROVIDER_SITE_OTHER): Payer: Medicare Other | Admitting: Nurse Practitioner

## 2024-01-30 DIAGNOSIS — Z0000 Encounter for general adult medical examination without abnormal findings: Secondary | ICD-10-CM
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
## 2024-01-30 NOTE — Progress Notes (Signed)
   This service is provided via telemedicine  No vital signs collected/recorded due to the encounter was a telemedicine visit.   Location of patient (ex: home, work):  Home  Patient consents to a telephone visit: Yes  Location of the provider (ex: office, home):  [facility], Office   Name of any referring provider:  N/A  Names of all persons participating in the telemedicine service and their role in the encounter:  S.Chrae B/CMA, Abbey Chatters, NP, and Patient   Time spent on call:  10 min with medical assistant

## 2024-01-30 NOTE — Patient Instructions (Signed)
  [name] , Thank you for taking time to come for your Medicare Wellness Visit. I appreciate your ongoing commitment to your health goals. Please review the following plan we discussed and let me know if I can assist you in the future.    This is a list of the screening recommended for you and due dates:  Health Maintenance  Topic Date Due   Eye exam for diabetics  07/20/2023   COVID-19 Vaccine (8 - Moderna risk 2024-25 season) 03/06/2024   Complete foot exam   04/12/2024   Hemoglobin A1C  04/12/2024   Medicare Annual Wellness Visit  01/29/2025   DTaP/Tdap/Td vaccine (3 - Td or Tdap) 01/15/2031   Pneumonia Vaccine  Completed   Flu Shot  Completed   Zoster (Shingles) Vaccine  Completed   HPV Vaccine  Aged Out

## 2024-02-08 ENCOUNTER — Encounter: Payer: Self-pay | Admitting: Cardiology

## 2024-02-08 ENCOUNTER — Ambulatory Visit: Attending: Cardiology | Admitting: Cardiology

## 2024-02-08 VITALS — BP 110/58 | HR 56 | Ht 69.0 in | Wt 166.0 lb

## 2024-02-08 DIAGNOSIS — I482 Chronic atrial fibrillation, unspecified: Secondary | ICD-10-CM | POA: Diagnosis not present

## 2024-02-08 DIAGNOSIS — E1140 Type 2 diabetes mellitus with diabetic neuropathy, unspecified: Secondary | ICD-10-CM

## 2024-02-08 DIAGNOSIS — I2510 Atherosclerotic heart disease of native coronary artery without angina pectoris: Secondary | ICD-10-CM | POA: Diagnosis not present

## 2024-02-08 DIAGNOSIS — I10 Essential (primary) hypertension: Secondary | ICD-10-CM

## 2024-02-08 DIAGNOSIS — E782 Mixed hyperlipidemia: Secondary | ICD-10-CM

## 2024-02-08 NOTE — Patient Instructions (Signed)
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
## 2024-02-09 ENCOUNTER — Other Ambulatory Visit: Payer: Medicare Other

## 2024-02-09 DIAGNOSIS — E1149 Type 2 diabetes mellitus with other diabetic neurological complication: Secondary | ICD-10-CM | POA: Diagnosis not present

## 2024-02-10 LAB — COMPREHENSIVE METABOLIC PANEL WITH GFR
AG Ratio: 1.7 (calc) (ref 1.0–2.5)
ALT: 12 U/L (ref 9–46)
AST: 11 U/L (ref 10–35)
Albumin: 3.6 g/dL (ref 3.6–5.1)
Alkaline phosphatase (APISO): 51 U/L (ref 35–144)
BUN: 22 mg/dL (ref 7–25)
CO2: 26 mmol/L (ref 20–32)
Calcium: 8.5 mg/dL — ABNORMAL LOW (ref 8.6–10.3)
Chloride: 106 mmol/L (ref 98–110)
Creat: 1.06 mg/dL (ref 0.70–1.22)
Globulin: 2.1 g/dL (ref 1.9–3.7)
Glucose, Bld: 131 mg/dL — ABNORMAL HIGH (ref 65–99)
Potassium: 4.3 mmol/L (ref 3.5–5.3)
Sodium: 140 mmol/L (ref 135–146)
Total Bilirubin: 0.5 mg/dL (ref 0.2–1.2)
Total Protein: 5.7 g/dL — ABNORMAL LOW (ref 6.1–8.1)
eGFR: 67 mL/min/{1.73_m2} (ref >=60)

## 2024-02-10 LAB — CBC WITH DIFFERENTIAL/PLATELET
Absolute Lymphocytes: 1118 {cells}/uL (ref 850–3900)
Absolute Monocytes: 473 {cells}/uL (ref 200–950)
Basophils Absolute: 42 {cells}/uL (ref 0–200)
Basophils Relative: 0.8 %
Eosinophils Absolute: 224 {cells}/uL (ref 15–500)
Eosinophils Relative: 4.3 %
HCT: 40.6 % (ref 38.5–50.0)
Hemoglobin: 13.6 g/dL (ref 13.2–17.1)
MCH: 31.8 pg (ref 27.0–33.0)
MCHC: 33.5 g/dL (ref 32.0–36.0)
MCV: 94.9 fL (ref 80.0–100.0)
MPV: 10.7 fL (ref 7.5–12.5)
Monocytes Relative: 9.1 %
Neutro Abs: 3344 {cells}/uL (ref 1500–7800)
Neutrophils Relative %: 64.3 %
Platelets: 174 10*3/uL (ref 140–400)
RBC: 4.28 10*6/uL (ref 4.20–5.80)
RDW: 12.4 % (ref 11.0–15.0)
Total Lymphocyte: 21.5 %
WBC: 5.2 10*3/uL (ref 3.8–10.8)

## 2024-02-10 LAB — SPECIMEN ID NOTIFICATION MISSING 2ND ID

## 2024-02-10 LAB — HEMOGLOBIN A1C
Hgb A1c MFr Bld: 7.1 %{Hb} — ABNORMAL HIGH (ref <5.7)
Mean Plasma Glucose: 157 mg/dL
eAG (mmol/L): 8.7 mmol/L

## 2024-02-13 ENCOUNTER — Other Ambulatory Visit: Payer: Medicare Other

## 2024-02-22 ENCOUNTER — Encounter: Payer: Self-pay | Admitting: Internal Medicine

## 2024-02-22 ENCOUNTER — Non-Acute Institutional Stay: Payer: Medicare Other | Admitting: Internal Medicine

## 2024-02-22 VITALS — BP 122/78 | HR 91 | Temp 97.0°F | Resp 18 | Ht 69.0 in | Wt 168.3 lb

## 2024-02-22 DIAGNOSIS — E1149 Type 2 diabetes mellitus with other diabetic neurological complication: Secondary | ICD-10-CM

## 2024-02-22 DIAGNOSIS — K625 Hemorrhage of anus and rectum: Secondary | ICD-10-CM

## 2024-02-22 DIAGNOSIS — R600 Localized edema: Secondary | ICD-10-CM

## 2024-02-22 DIAGNOSIS — I482 Chronic atrial fibrillation, unspecified: Secondary | ICD-10-CM

## 2024-02-22 DIAGNOSIS — E782 Mixed hyperlipidemia: Secondary | ICD-10-CM | POA: Diagnosis not present

## 2024-02-22 DIAGNOSIS — R1310 Dysphagia, unspecified: Secondary | ICD-10-CM

## 2024-02-22 NOTE — Patient Instructions (Addendum)
 Labs work done at [facility] Monday 07/23/2024 at 7:45 am
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
## 2024-02-23 DIAGNOSIS — H0015 Chalazion left lower eyelid: Secondary | ICD-10-CM | POA: Diagnosis not present

## 2024-02-27 ENCOUNTER — Ambulatory Visit: Admitting: Cardiology

## 2024-03-05 DIAGNOSIS — C61 Malignant neoplasm of prostate: Secondary | ICD-10-CM | POA: Diagnosis not present

## 2024-03-12 ENCOUNTER — Other Ambulatory Visit: Payer: Self-pay | Admitting: Cardiology

## 2024-03-12 DIAGNOSIS — E1149 Type 2 diabetes mellitus with other diabetic neurological complication: Secondary | ICD-10-CM

## 2024-03-12 DIAGNOSIS — C61 Malignant neoplasm of prostate: Secondary | ICD-10-CM | POA: Diagnosis not present

## 2024-03-12 DIAGNOSIS — E782 Mixed hyperlipidemia: Secondary | ICD-10-CM

## 2024-03-12 DIAGNOSIS — I2510 Atherosclerotic heart disease of native coronary artery without angina pectoris: Secondary | ICD-10-CM

## 2024-03-12 DIAGNOSIS — I482 Chronic atrial fibrillation, unspecified: Secondary | ICD-10-CM

## 2024-03-12 DIAGNOSIS — R351 Nocturia: Secondary | ICD-10-CM | POA: Diagnosis not present

## 2024-04-02 ENCOUNTER — Other Ambulatory Visit: Payer: Self-pay

## 2024-04-02 DIAGNOSIS — I482 Chronic atrial fibrillation, unspecified: Secondary | ICD-10-CM

## 2024-04-02 MED ORDER — RIVAROXABAN 20 MG PO TABS: ORAL_TABLET | ORAL | 1 refills | Status: DC

## 2024-04-02 NOTE — Telephone Encounter (Signed)
 Prescription refill request for Xarelto  received.  Indication:afib Last office visit:4/25 Weight:76.3  kg Age:89 Scr:1.06  4/25 CrCl:50.99  ml/min  Prescription refilled

## 2024-05-08 DIAGNOSIS — K080 Exfoliation of teeth due to systemic causes: Secondary | ICD-10-CM | POA: Diagnosis not present

## 2024-06-21 DIAGNOSIS — E119 Type 2 diabetes mellitus without complications: Secondary | ICD-10-CM | POA: Diagnosis not present

## 2024-06-21 DIAGNOSIS — H52223 Regular astigmatism, bilateral: Secondary | ICD-10-CM | POA: Diagnosis not present

## 2024-06-21 DIAGNOSIS — Z961 Presence of intraocular lens: Secondary | ICD-10-CM | POA: Diagnosis not present

## 2024-06-21 DIAGNOSIS — H5212 Myopia, left eye: Secondary | ICD-10-CM | POA: Diagnosis not present

## 2024-06-21 DIAGNOSIS — H5201 Hypermetropia, right eye: Secondary | ICD-10-CM | POA: Diagnosis not present

## 2024-06-21 LAB — OPHTHALMOLOGY REPORT-SCANNED

## 2024-07-25 ENCOUNTER — Encounter: Admitting: Internal Medicine

## 2024-07-26 DIAGNOSIS — E782 Mixed hyperlipidemia: Secondary | ICD-10-CM | POA: Diagnosis not present

## 2024-07-26 DIAGNOSIS — E1149 Type 2 diabetes mellitus with other diabetic neurological complication: Secondary | ICD-10-CM | POA: Diagnosis not present

## 2024-07-26 DIAGNOSIS — I482 Chronic atrial fibrillation, unspecified: Secondary | ICD-10-CM | POA: Diagnosis not present

## 2024-07-27 LAB — COMPLETE METABOLIC PANEL WITHOUT GFR
AG Ratio: 1.8 (calc) (ref 1.0–2.5)
ALT: 11 U/L (ref 9–46)
AST: 11 U/L (ref 10–35)
Albumin: 3.8 g/dL (ref 3.6–5.1)
Alkaline phosphatase (APISO): 54 U/L (ref 35–144)
BUN: 22 mg/dL (ref 7–25)
CO2: 28 mmol/L (ref 20–32)
Calcium: 8.5 mg/dL — ABNORMAL LOW (ref 8.6–10.3)
Chloride: 106 mmol/L (ref 98–110)
Creat: 1.01 mg/dL (ref 0.70–1.22)
Globulin: 2.1 g/dL (ref 1.9–3.7)
Glucose, Bld: 129 mg/dL — ABNORMAL HIGH (ref 65–99)
Potassium: 4.1 mmol/L (ref 3.5–5.3)
Sodium: 141 mmol/L (ref 135–146)
Total Bilirubin: 0.6 mg/dL (ref 0.2–1.2)
Total Protein: 5.9 g/dL — ABNORMAL LOW (ref 6.1–8.1)

## 2024-07-27 LAB — CBC WITH DIFFERENTIAL/PLATELET
Absolute Lymphocytes: 1775 {cells}/uL (ref 850–3900)
Absolute Monocytes: 673 {cells}/uL (ref 200–950)
Basophils Absolute: 59 {cells}/uL (ref 0–200)
Basophils Relative: 0.9 %
Eosinophils Absolute: 363 {cells}/uL (ref 15–500)
Eosinophils Relative: 5.5 %
HCT: 43.1 % (ref 38.5–50.0)
Hemoglobin: 14.3 g/dL (ref 13.2–17.1)
MCH: 32.4 pg (ref 27.0–33.0)
MCHC: 33.2 g/dL (ref 32.0–36.0)
MCV: 97.5 fL (ref 80.0–100.0)
MPV: 10.8 fL (ref 7.5–12.5)
Monocytes Relative: 10.2 %
Neutro Abs: 3729 {cells}/uL (ref 1500–7800)
Neutrophils Relative %: 56.5 %
Platelets: 189 Thousand/uL (ref 140–400)
RBC: 4.42 Million/uL (ref 4.20–5.80)
RDW: 12.5 % (ref 11.0–15.0)
Total Lymphocyte: 26.9 %
WBC: 6.6 Thousand/uL (ref 3.8–10.8)

## 2024-07-27 LAB — LIPID PANEL
Cholesterol: 120 mg/dL (ref <200)
HDL: 40 mg/dL (ref >=40)
LDL Cholesterol (Calc): 61 mg/dL
Non-HDL Cholesterol (Calc): 80 mg/dL (ref <130)
Total CHOL/HDL Ratio: 3 (calc) (ref <5.0)
Triglycerides: 100 mg/dL (ref <150)

## 2024-07-27 LAB — HEMOGLOBIN A1C
Hgb A1c MFr Bld: 7.1 % — ABNORMAL HIGH (ref <5.7)
Mean Plasma Glucose: 157 mg/dL
eAG (mmol/L): 8.7 mmol/L

## 2024-07-31 NOTE — Patient Instructions (Signed)
 1) Please visit your local pharmacy to receive your flu and covid vaccine, if you have not already received.

## 2024-08-01 ENCOUNTER — Non-Acute Institutional Stay: Admitting: Internal Medicine

## 2024-08-01 ENCOUNTER — Encounter: Payer: Self-pay | Admitting: Internal Medicine

## 2024-08-01 VITALS — BP 118/68 | HR 78 | Temp 97.4°F | Resp 18 | Ht 69.0 in | Wt 168.4 lb

## 2024-08-01 DIAGNOSIS — E1149 Type 2 diabetes mellitus with other diabetic neurological complication: Secondary | ICD-10-CM

## 2024-08-01 DIAGNOSIS — C61 Malignant neoplasm of prostate: Secondary | ICD-10-CM

## 2024-08-01 DIAGNOSIS — K625 Hemorrhage of anus and rectum: Secondary | ICD-10-CM

## 2024-08-01 DIAGNOSIS — R600 Localized edema: Secondary | ICD-10-CM | POA: Diagnosis not present

## 2024-08-01 DIAGNOSIS — Z8673 Personal history of transient ischemic attack (TIA), and cerebral infarction without residual deficits: Secondary | ICD-10-CM

## 2024-08-01 DIAGNOSIS — E782 Mixed hyperlipidemia: Secondary | ICD-10-CM | POA: Diagnosis not present

## 2024-08-01 DIAGNOSIS — I482 Chronic atrial fibrillation, unspecified: Secondary | ICD-10-CM | POA: Diagnosis not present
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
## 2024-08-13 ENCOUNTER — Other Ambulatory Visit: Payer: Self-pay | Admitting: Cardiology

## 2024-09-06 ENCOUNTER — Other Ambulatory Visit: Payer: Self-pay | Admitting: Internal Medicine

## 2024-09-12 ENCOUNTER — Other Ambulatory Visit: Payer: Self-pay | Admitting: Cardiology

## 2024-09-12 DIAGNOSIS — I482 Chronic atrial fibrillation, unspecified: Secondary | ICD-10-CM

## 2024-09-12 DIAGNOSIS — E1149 Type 2 diabetes mellitus with other diabetic neurological complication: Secondary | ICD-10-CM

## 2024-09-12 DIAGNOSIS — I2510 Atherosclerotic heart disease of native coronary artery without angina pectoris: Secondary | ICD-10-CM

## 2024-09-12 DIAGNOSIS — E782 Mixed hyperlipidemia: Secondary | ICD-10-CM

## 2024-09-17 ENCOUNTER — Other Ambulatory Visit: Payer: Self-pay | Admitting: Cardiology

## 2024-09-17 ENCOUNTER — Other Ambulatory Visit: Payer: Self-pay | Admitting: Internal Medicine

## 2024-09-17 DIAGNOSIS — I482 Chronic atrial fibrillation, unspecified: Secondary | ICD-10-CM

## 2024-09-17 NOTE — Telephone Encounter (Signed)
 Prescription refill request for Xarelto  received.  Indication: a-fib Last office visit: 02/08/2024 Weight: 76.4kg Age: 89 Scr: 1.01 CrCl: 54  Refill sent

## 2024-09-25 ENCOUNTER — Other Ambulatory Visit: Payer: Self-pay | Admitting: Cardiology

## 2024-09-25 ENCOUNTER — Other Ambulatory Visit: Payer: Self-pay | Admitting: Internal Medicine

## 2024-09-25 DIAGNOSIS — I482 Chronic atrial fibrillation, unspecified: Secondary | ICD-10-CM

## 2024-09-25 NOTE — Telephone Encounter (Signed)
 Prescription refill request for Xarelto  received.  Indication:afib Last office visit:4/25 Weight:76.4  kg Age:89 Scr:1.01  9/25 CrCl:53.58  ml/min  Prescription refilled

## 2024-10-01 ENCOUNTER — Telehealth: Payer: Self-pay | Admitting: Internal Medicine

## 2024-10-01 NOTE — Telephone Encounter (Unsigned)
 Copied from CRM #[id number]. Topic: Clinical - Medication Refill >> Oct 01, 2024 10:52 AM Miquel SAILOR wrote: Medication: metFORMIN  (GLUCOPHAGE ) 500 MG tablet   Has the patient contacted their pharmacy? Yes (Agent: If no, request that the patient contact the pharmacy for the refill. If patient does not wish to contact the pharmacy document the reason why and proceed with request.) (Agent: If yes, when and what did the pharmacy advise?)  This is the patient's preferred pharmacy:  [id number] [address], [place] - [address] AT [facility] OF [facility] & [address] [place] [address] Phone: [phone] Fax: [phone]  Is this the correct pharmacy for this prescription? Yes If no, delete pharmacy and type the correct one.   Has the prescription been filled recently? Yes  Is the patient out of the medication? No 3 pills   Has the patient been seen for an appointment in the last year OR does the patient have an upcoming appointment? Yes  Can we respond through MyChart? Yes  Agent: Please be advised that Rx refills may take up to 3 business days. We ask that you follow-up with your pharmacy.

## 2024-10-04 ENCOUNTER — Telehealth: Payer: Self-pay

## 2024-10-04 MED ORDER — METFORMIN HCL 500 MG PO TABS: 500.0000 mg | ORAL_TABLET | Freq: Every day | ORAL | 1 refills | Status: AC

## 2024-10-04 NOTE — Telephone Encounter (Signed)
 Rx sent to the pharmacy.

## 2025-01-02 ENCOUNTER — Encounter: Payer: Self-pay | Admitting: Internal Medicine

## 2025-02-04 ENCOUNTER — Ambulatory Visit: Payer: Self-pay | Admitting: Nurse Practitioner
